# Patient Record
Sex: Male | Born: 1945
Health system: Southern US, Community
[De-identification: ages and names within clinical notes are randomized; demographics above are authoritative.]

## PROBLEM LIST (undated history)

## (undated) DIAGNOSIS — K635 Polyp of colon: Secondary | ICD-10-CM

## (undated) DIAGNOSIS — M1990 Unspecified osteoarthritis, unspecified site: Secondary | ICD-10-CM

## (undated) DIAGNOSIS — H269 Unspecified cataract: Secondary | ICD-10-CM

## (undated) DIAGNOSIS — K602 Anal fissure, unspecified: Secondary | ICD-10-CM

## (undated) DIAGNOSIS — K603 Anal fistula, unspecified: Secondary | ICD-10-CM

## (undated) DIAGNOSIS — Z5189 Encounter for other specified aftercare: Secondary | ICD-10-CM

## (undated) DIAGNOSIS — I4891 Unspecified atrial fibrillation: Secondary | ICD-10-CM

## (undated) DIAGNOSIS — I509 Heart failure, unspecified: Secondary | ICD-10-CM

## (undated) DIAGNOSIS — M1120 Other chondrocalcinosis, unspecified site: Secondary | ICD-10-CM

## (undated) DIAGNOSIS — I10 Essential (primary) hypertension: Secondary | ICD-10-CM

## (undated) DIAGNOSIS — K922 Gastrointestinal hemorrhage, unspecified: Secondary | ICD-10-CM

## (undated) DIAGNOSIS — K219 Gastro-esophageal reflux disease without esophagitis: Secondary | ICD-10-CM

## (undated) DIAGNOSIS — I482 Chronic atrial fibrillation, unspecified: Secondary | ICD-10-CM

## (undated) DIAGNOSIS — G4730 Sleep apnea, unspecified: Secondary | ICD-10-CM

## (undated) DIAGNOSIS — Z950 Presence of cardiac pacemaker: Secondary | ICD-10-CM

## (undated) DIAGNOSIS — M19011 Primary osteoarthritis, right shoulder: Secondary | ICD-10-CM

## (undated) DIAGNOSIS — E785 Hyperlipidemia, unspecified: Secondary | ICD-10-CM

## (undated) HISTORY — PX: PACEMAKER INSERTION: SHX728

## (undated) HISTORY — DX: Anal fissure, unspecified: K60.2

## (undated) HISTORY — PX: WISDOM TOOTH EXTRACTION: SHX21

## (undated) HISTORY — DX: Anal fistula, unspecified: K60.30

## (undated) HISTORY — DX: Heart failure, unspecified: I50.9

## (undated) HISTORY — DX: Polyp of colon: K63.5

## (undated) HISTORY — DX: Presence of cardiac pacemaker: Z95.0

## (undated) HISTORY — DX: Chronic atrial fibrillation, unspecified: I48.20

## (undated) HISTORY — PX: SHOULDER SURGERY: SHX246

## (undated) HISTORY — DX: Hyperlipidemia, unspecified: E78.5

## (undated) HISTORY — DX: Sleep apnea, unspecified: G47.30

## (undated) HISTORY — DX: Other chondrocalcinosis, unspecified site: M11.20

## (undated) HISTORY — DX: Essential (primary) hypertension: I10

## (undated) HISTORY — DX: Anal fistula: K60.3

## (undated) HISTORY — PX: TREATMENT FISTULA ANAL: SUR1390

## (undated) HISTORY — DX: Primary osteoarthritis, right shoulder: M19.011

## (undated) HISTORY — DX: Encounter for other specified aftercare: Z51.89

## (undated) HISTORY — DX: Gastrointestinal hemorrhage, unspecified: K92.2

## (undated) HISTORY — DX: Unspecified osteoarthritis, unspecified site: M19.90

## (undated) HISTORY — PX: TONSILLECTOMY: SUR1361

## (undated) HISTORY — DX: Unspecified cataract: H26.9

## (undated) HISTORY — DX: Unspecified atrial fibrillation: I48.91

---

## 1998-07-18 ENCOUNTER — Inpatient Hospital Stay (HOSPITAL_COMMUNITY): Admission: EM | Admit: 1998-07-18 | Discharge: 1998-07-23 | Payer: Self-pay | Admitting: Internal Medicine

## 1998-09-05 ENCOUNTER — Ambulatory Visit (HOSPITAL_COMMUNITY): Admission: RE | Admit: 1998-09-05 | Discharge: 1998-09-05 | Payer: Self-pay | Admitting: Cardiovascular Disease

## 1998-11-29 ENCOUNTER — Inpatient Hospital Stay (HOSPITAL_COMMUNITY): Admission: AD | Admit: 1998-11-29 | Discharge: 1998-12-01 | Payer: Self-pay | Admitting: Neurology

## 1998-11-30 ENCOUNTER — Encounter: Payer: Self-pay | Admitting: Internal Medicine

## 2003-08-24 ENCOUNTER — Encounter: Payer: Self-pay | Admitting: Internal Medicine

## 2003-09-19 ENCOUNTER — Encounter: Payer: Self-pay | Admitting: Internal Medicine

## 2004-01-09 ENCOUNTER — Encounter: Admission: RE | Admit: 2004-01-09 | Discharge: 2004-01-09 | Payer: Self-pay | Admitting: Internal Medicine

## 2004-04-12 ENCOUNTER — Encounter: Payer: Self-pay | Admitting: Internal Medicine

## 2004-04-12 ENCOUNTER — Inpatient Hospital Stay (HOSPITAL_COMMUNITY): Admission: AD | Admit: 2004-04-12 | Discharge: 2004-04-17 | Payer: Self-pay | Admitting: Internal Medicine

## 2004-04-29 ENCOUNTER — Ambulatory Visit (HOSPITAL_COMMUNITY): Admission: RE | Admit: 2004-04-29 | Discharge: 2004-04-29 | Payer: Self-pay | Admitting: Internal Medicine

## 2004-04-29 ENCOUNTER — Encounter: Payer: Self-pay | Admitting: Internal Medicine

## 2004-06-21 ENCOUNTER — Encounter (INDEPENDENT_AMBULATORY_CARE_PROVIDER_SITE_OTHER): Payer: Self-pay | Admitting: *Deleted

## 2004-09-06 ENCOUNTER — Ambulatory Visit: Payer: Self-pay

## 2004-10-08 ENCOUNTER — Ambulatory Visit: Payer: Self-pay

## 2004-11-02 ENCOUNTER — Ambulatory Visit: Payer: Self-pay | Admitting: Internal Medicine

## 2004-11-20 ENCOUNTER — Ambulatory Visit: Payer: Self-pay | Admitting: Internal Medicine

## 2004-11-21 ENCOUNTER — Ambulatory Visit: Payer: Self-pay | Admitting: Internal Medicine

## 2004-12-17 ENCOUNTER — Ambulatory Visit: Payer: Self-pay | Admitting: Internal Medicine

## 2004-12-20 ENCOUNTER — Ambulatory Visit: Payer: Self-pay | Admitting: Internal Medicine

## 2005-01-21 ENCOUNTER — Ambulatory Visit: Payer: Self-pay | Admitting: Internal Medicine

## 2005-02-21 ENCOUNTER — Ambulatory Visit: Payer: Self-pay | Admitting: Internal Medicine

## 2005-03-28 ENCOUNTER — Ambulatory Visit: Payer: Self-pay | Admitting: Internal Medicine

## 2005-05-02 ENCOUNTER — Ambulatory Visit: Payer: Self-pay | Admitting: Internal Medicine

## 2005-06-06 ENCOUNTER — Ambulatory Visit: Payer: Self-pay | Admitting: Internal Medicine

## 2005-07-11 ENCOUNTER — Ambulatory Visit: Payer: Self-pay | Admitting: Internal Medicine

## 2005-08-07 ENCOUNTER — Ambulatory Visit: Payer: Self-pay | Admitting: Internal Medicine

## 2005-09-12 ENCOUNTER — Ambulatory Visit: Payer: Self-pay | Admitting: Internal Medicine

## 2005-10-17 ENCOUNTER — Ambulatory Visit: Payer: Self-pay | Admitting: Internal Medicine

## 2005-12-12 ENCOUNTER — Ambulatory Visit: Payer: Self-pay | Admitting: Internal Medicine

## 2006-01-12 ENCOUNTER — Ambulatory Visit: Payer: Self-pay | Admitting: Internal Medicine

## 2006-02-18 ENCOUNTER — Ambulatory Visit: Payer: Self-pay | Admitting: Internal Medicine

## 2006-03-25 ENCOUNTER — Ambulatory Visit: Payer: Self-pay | Admitting: Internal Medicine

## 2006-04-24 ENCOUNTER — Ambulatory Visit: Payer: Self-pay | Admitting: Internal Medicine

## 2006-06-05 ENCOUNTER — Ambulatory Visit: Payer: Self-pay | Admitting: Internal Medicine

## 2006-07-09 ENCOUNTER — Ambulatory Visit: Payer: Self-pay | Admitting: Internal Medicine

## 2006-08-14 ENCOUNTER — Ambulatory Visit: Payer: Self-pay | Admitting: Internal Medicine

## 2006-09-11 ENCOUNTER — Ambulatory Visit: Payer: Self-pay | Admitting: Internal Medicine

## 2006-11-05 ENCOUNTER — Ambulatory Visit: Payer: Self-pay | Admitting: Internal Medicine

## 2006-11-30 ENCOUNTER — Ambulatory Visit: Payer: Self-pay | Admitting: Internal Medicine

## 2006-12-31 ENCOUNTER — Ambulatory Visit: Payer: Self-pay | Admitting: Internal Medicine

## 2007-01-28 ENCOUNTER — Ambulatory Visit: Payer: Self-pay | Admitting: Internal Medicine

## 2007-02-25 ENCOUNTER — Ambulatory Visit: Payer: Self-pay | Admitting: Internal Medicine

## 2007-03-25 ENCOUNTER — Ambulatory Visit: Payer: Self-pay | Admitting: Internal Medicine

## 2007-04-21 ENCOUNTER — Ambulatory Visit: Payer: Self-pay | Admitting: Internal Medicine

## 2007-05-19 ENCOUNTER — Ambulatory Visit: Payer: Self-pay | Admitting: Internal Medicine

## 2007-06-01 ENCOUNTER — Ambulatory Visit: Payer: Self-pay | Admitting: Internal Medicine

## 2007-07-15 ENCOUNTER — Ambulatory Visit: Payer: Self-pay | Admitting: Internal Medicine

## 2007-08-12 ENCOUNTER — Ambulatory Visit: Payer: Self-pay | Admitting: Internal Medicine

## 2007-09-10 ENCOUNTER — Ambulatory Visit: Payer: Self-pay | Admitting: Internal Medicine

## 2007-10-08 ENCOUNTER — Ambulatory Visit: Payer: Self-pay | Admitting: Internal Medicine

## 2007-11-04 ENCOUNTER — Ambulatory Visit: Payer: Self-pay | Admitting: Internal Medicine

## 2008-02-03 ENCOUNTER — Ambulatory Visit: Payer: Self-pay | Admitting: Internal Medicine

## 2008-05-04 ENCOUNTER — Ambulatory Visit: Payer: Self-pay | Admitting: Internal Medicine

## 2008-06-08 ENCOUNTER — Ambulatory Visit: Payer: Self-pay | Admitting: Internal Medicine

## 2008-08-03 ENCOUNTER — Ambulatory Visit: Payer: Self-pay | Admitting: Internal Medicine

## 2008-09-16 DIAGNOSIS — I4891 Unspecified atrial fibrillation: Secondary | ICD-10-CM | POA: Insufficient documentation

## 2008-09-16 DIAGNOSIS — I442 Atrioventricular block, complete: Secondary | ICD-10-CM | POA: Insufficient documentation

## 2008-09-16 DIAGNOSIS — Z950 Presence of cardiac pacemaker: Secondary | ICD-10-CM | POA: Insufficient documentation

## 2008-09-16 DIAGNOSIS — I10 Essential (primary) hypertension: Secondary | ICD-10-CM

## 2008-11-02 ENCOUNTER — Ambulatory Visit: Payer: Self-pay | Admitting: Internal Medicine

## 2009-01-16 ENCOUNTER — Encounter: Payer: Self-pay | Admitting: Internal Medicine

## 2009-01-16 ENCOUNTER — Ambulatory Visit: Payer: Self-pay | Admitting: Internal Medicine

## 2009-01-16 LAB — CONVERTED CEMR LAB
BUN: 23 mg/dL (ref 6–23)
Basophils Relative: 0.2 % (ref 0.0–3.0)
Chloride: 104 meq/L (ref 96–112)
Eosinophils Relative: 1.6 % (ref 0.0–5.0)
Glucose, Bld: 104 mg/dL — ABNORMAL HIGH (ref 70–99)
HCT: 43.2 % (ref 39.0–52.0)
Hemoglobin: 14.8 g/dL (ref 13.0–17.0)
Lymphs Abs: 1.2 10*3/uL (ref 0.7–4.0)
MCV: 91.4 fL (ref 78.0–100.0)
Monocytes Absolute: 0.8 10*3/uL (ref 0.1–1.0)
Potassium: 3.7 meq/L (ref 3.5–5.1)
RBC: 4.73 M/uL (ref 4.22–5.81)
WBC: 7.3 10*3/uL (ref 4.5–10.5)

## 2009-01-23 ENCOUNTER — Telehealth: Payer: Self-pay | Admitting: Internal Medicine

## 2009-01-24 ENCOUNTER — Ambulatory Visit: Payer: Self-pay | Admitting: Internal Medicine

## 2009-01-24 ENCOUNTER — Inpatient Hospital Stay (HOSPITAL_COMMUNITY): Admission: RE | Admit: 2009-01-24 | Discharge: 2009-01-25 | Payer: Self-pay | Admitting: Internal Medicine

## 2009-01-25 ENCOUNTER — Encounter: Payer: Self-pay | Admitting: Internal Medicine

## 2009-02-07 ENCOUNTER — Encounter: Payer: Self-pay | Admitting: Internal Medicine

## 2009-02-07 ENCOUNTER — Ambulatory Visit: Payer: Self-pay

## 2009-02-23 ENCOUNTER — Telehealth: Payer: Self-pay | Admitting: Internal Medicine

## 2009-02-26 ENCOUNTER — Telehealth: Payer: Self-pay | Admitting: Internal Medicine

## 2009-03-21 ENCOUNTER — Telehealth (INDEPENDENT_AMBULATORY_CARE_PROVIDER_SITE_OTHER): Payer: Self-pay | Admitting: *Deleted

## 2009-04-17 ENCOUNTER — Ambulatory Visit: Payer: Self-pay | Admitting: Internal Medicine

## 2009-06-11 ENCOUNTER — Ambulatory Visit: Payer: Self-pay | Admitting: Cardiology

## 2009-06-28 ENCOUNTER — Ambulatory Visit: Payer: Self-pay

## 2009-06-28 ENCOUNTER — Encounter: Payer: Self-pay | Admitting: Cardiology

## 2009-07-23 ENCOUNTER — Encounter: Payer: Self-pay | Admitting: Internal Medicine

## 2009-07-23 ENCOUNTER — Ambulatory Visit: Payer: Self-pay | Admitting: Internal Medicine

## 2009-08-01 ENCOUNTER — Encounter: Payer: Self-pay | Admitting: Internal Medicine

## 2009-09-18 ENCOUNTER — Telehealth: Payer: Self-pay | Admitting: Internal Medicine

## 2009-09-20 ENCOUNTER — Telehealth: Payer: Self-pay | Admitting: Internal Medicine

## 2009-10-18 ENCOUNTER — Encounter (INDEPENDENT_AMBULATORY_CARE_PROVIDER_SITE_OTHER): Payer: Self-pay | Admitting: *Deleted

## 2009-10-21 ENCOUNTER — Encounter: Payer: Self-pay | Admitting: Internal Medicine

## 2009-10-22 ENCOUNTER — Ambulatory Visit: Payer: Self-pay | Admitting: Internal Medicine

## 2009-10-25 ENCOUNTER — Telehealth (INDEPENDENT_AMBULATORY_CARE_PROVIDER_SITE_OTHER): Payer: Self-pay | Admitting: *Deleted

## 2009-10-25 ENCOUNTER — Encounter: Payer: Self-pay | Admitting: Internal Medicine

## 2009-11-20 ENCOUNTER — Telehealth: Payer: Self-pay | Admitting: Internal Medicine

## 2009-11-30 ENCOUNTER — Encounter (INDEPENDENT_AMBULATORY_CARE_PROVIDER_SITE_OTHER): Payer: Self-pay | Admitting: *Deleted

## 2009-12-20 ENCOUNTER — Encounter (INDEPENDENT_AMBULATORY_CARE_PROVIDER_SITE_OTHER): Payer: Self-pay | Admitting: *Deleted

## 2010-01-20 ENCOUNTER — Encounter: Payer: Self-pay | Admitting: Internal Medicine

## 2010-01-21 ENCOUNTER — Ambulatory Visit: Payer: Self-pay | Admitting: Internal Medicine

## 2010-01-30 ENCOUNTER — Encounter: Payer: Self-pay | Admitting: Internal Medicine

## 2010-02-25 ENCOUNTER — Ambulatory Visit: Payer: Self-pay | Admitting: Internal Medicine

## 2010-02-25 DIAGNOSIS — Z860100 Personal history of colon polyps, unspecified: Secondary | ICD-10-CM | POA: Insufficient documentation

## 2010-03-01 ENCOUNTER — Encounter: Payer: Self-pay | Admitting: Internal Medicine

## 2010-03-14 ENCOUNTER — Ambulatory Visit: Payer: Self-pay | Admitting: Internal Medicine

## 2010-03-18 ENCOUNTER — Encounter: Payer: Self-pay | Admitting: Internal Medicine

## 2010-04-16 ENCOUNTER — Ambulatory Visit: Payer: Self-pay | Admitting: Internal Medicine

## 2010-04-18 ENCOUNTER — Encounter: Admission: RE | Admit: 2010-04-18 | Discharge: 2010-04-18 | Payer: Self-pay | Admitting: Orthopedic Surgery

## 2010-04-22 ENCOUNTER — Telehealth: Payer: Self-pay | Admitting: Internal Medicine

## 2010-07-19 ENCOUNTER — Encounter: Payer: Self-pay | Admitting: Internal Medicine

## 2010-08-05 ENCOUNTER — Ambulatory Visit: Payer: Self-pay | Admitting: Cardiology

## 2010-08-05 ENCOUNTER — Encounter: Payer: Self-pay | Admitting: Cardiology

## 2010-11-08 ENCOUNTER — Ambulatory Visit
Admission: RE | Admit: 2010-11-08 | Discharge: 2010-11-08 | Payer: Self-pay | Source: Home / Self Care | Attending: Internal Medicine | Admitting: Internal Medicine

## 2010-11-09 ENCOUNTER — Encounter: Payer: Self-pay | Admitting: Internal Medicine

## 2010-11-14 NOTE — Procedures (Signed)
Summary: Colonoscopy   Colonoscopy  Procedure date:  09/19/2003  Findings:      Location:  [facility].  Results: Polyp.    Colonoscopy  Procedure date:  09/19/2003  Findings:      Location:  [facility].  Results: Polyp.   Patient Name: Oracio, Galen. MRN:  Procedure Procedures: Colonoscopy CPT: [phone].    with polypectomy. CPT: A3573898.  Personnel: Endoscopist: Wilhemina Bonito. Marina Goodell, MD.  Referred By: Pearletha Furl Jacky Kindle, MD.  Exam Location: Exam performed in Outpatient Clinic. Outpatient  Patient Consent: Procedure, Alternatives, Risks and Benefits discussed, consent obtained, from patient. Consent was obtained by the RN.  Indications  Average Risk Screening Routine.  History  Current Medications: Patient is currently taking Coumadin.  Comments: Coumadin held 4 days prior to exam Pre-Exam Physical: Performed Sep 19, 2003. Entire physical exam was normal.  Exam Exam: Extent of exam reached: Cecum, extent intended: Cecum.  The cecum was identified by appendiceal orifice and IC valve. Patient position: on left side. Colon retroflexion performed. Images taken. ASA Classification: III. Tolerance: excellent.  Monitoring: Pulse and BP monitoring, Oximetry used. Supplemental O2 given.  Colon Prep Used Golytely for colon prep. Prep results: excellent.  Sedation Meds: Patient assessed and found to be appropriate for moderate (conscious) sedation. Fentanyl 75 mcg. given IV. Versed 7 mg. given IV.  Findings NORMAL EXAM: Cecum to Rectum. Comments: Melanosis coli present.  POLYP: Cecum, Maximum size: 20 mm. sessile polyp. Procedure:  snare with cautery, The polyp was removed piece meal. removed, retrieved, Polyp sent to pathology. ICD9: Colon Polyps: 211.3. Comments: Edge and base of lesion fulgarized. Lesion located on proximal lip of ICV.   Assessment Abnormal examination, see findings above.  Diagnoses: 211.3: Colon Polyps.    Events  Unplanned Interventions: No intervention was required.  Unplanned Events: There were no complications. Plans Comments: RESUME COUMADIN IN 1 WEEK Disposition: After procedure patient sent to recovery. After recovery patient sent home.  Scheduling/Referral: Colonoscopy, to Wilhemina Bonito. Marina Goodell, MD, IN 1 YEAR IF NO CANCER AND POLYP ADENOMATOUS.,    This report was created from the original endoscopy report, which was reviewed and signed by the above listed endoscopist.   cc:  Geoffry Paradise, MD      Kristeen Miss, MD      The Patient

## 2010-11-14 NOTE — Letter (Signed)
Summary: [facility]  [facility]   Imported By: Lester Joy 03/16/2010 08:12:02  _____________________________________________________________________  External Attachment:    Type:   Image     Comment:   External Document

## 2010-11-14 NOTE — Procedures (Signed)
Summary: EGD   EGD  Procedure date:  04/29/2004  Findings:      Location: [facility]  Findings: Gastritis   Patient Name: [name] Page, [name] Page. MRN:  Procedure Procedures: Panendoscopy (EGD) CPT: 43235.    with biopsy(s)/brushing(s). CPT: [id number].  Personnel: Endoscopist: Wilhemina Bonito. Marina Goodell, MD.  Exam Location: Exam performed in Endoscopy Suite.  Patient Consent: Procedure, Alternatives, Risks and Benefits discussed, consent obtained,  Indications  Surveillance of: Duodenal ulcer.  History  Current Medications: Patient is not currently taking Coumadin.  Comments: Currently off coumadin after acute GI bleed 04-12-04 Pre-Exam Physical: Performed Apr 29, 2004  Entire physical exam was normal.  Exam Exam Info: Maximum depth of insertion Duodenum, intended Duodenum. Patient position: on left side. Vocal cords visualized. Gastric retroflexion performed. Images taken. ASA Classification: III. Tolerance: excellent.  Sedation Meds: Demerol 70 mg. given IV. Versed 7 mg. given IV.  Monitoring: BP and pulse monitoring done. Oximetry used. Supplemental O2 given  Fluoroscopy: Fluoroscopy was not used.  Findings HIATAL HERNIA:  MUCOSAL ABNORMALITY: Antrum. Erosions present. RUT done, results pending. ICD9: Gastritis, Acute: 535.00. Comment: mild changes only.  HEALED ULCER: in Duodenal Bulb Comments: previous ulcer healed. clips absent.   Assessment Abnormal examination, see findings above.  Diagnoses: 535.00: Gastritis, Acute.   Events  Unplanned Intervention: No unplanned interventions were required.  Unplanned Events: There were no complications. Plans Comments: 1.Daily PPI indefinitely 2.OK to start coumadin if you wish 3. limit asa/nsaid exposure 4. F/U Clo and treat if + Disposition: After procedure patient sent to recovery. After recovery patient sent home.  Scheduling: Follow-up prn.  Comments: Return to the care of Dr. Jacky Kindle  This report was  created from the original endoscopy report, which was reviewed and signed by the above listed endoscopist.   cc:  Geoffry Paradise, MD      Kristeen Miss, MD      The Patient

## 2010-11-14 NOTE — Cardiovascular Report (Signed)
Summary: Office Visit Remote   Office Visit Remote   Imported By: Roderic Ovens 10/25/2009 11:23:19  _____________________________________________________________________  External Attachment:    Type:   Image     Comment:   External Document

## 2010-11-14 NOTE — Cardiovascular Report (Signed)
Summary: Office Visit Remote  Office Visit Remote   Imported By: Roderic Ovens 01/30/2010 14:17:36  _____________________________________________________________________  External Attachment:    Type:   Image     Comment:   External Document

## 2010-11-14 NOTE — Letter (Signed)
Summary: Remote Device Check  [facility], Main Office  [address]   [place], [place] [address]   Phone: [phone]  Fax: [phone]     October 25, 2009 MRN: [id number]   [name] Page [address] [place], [place]  [address]   Dear Mr. [name],   Your remote transmission was recieved and reviewed by your physician.  All diagnostics were within normal limits for you.  __X___Your next transmission is scheduled for:     January 21, 2010.  Please transmit at any time this day.  If you have a wireless device your transmission will be sent automatically.      Sincerely,  Proofreader

## 2010-11-14 NOTE — Letter (Signed)
Summary: Anticoagulation Modification Letter  [facility]  [address] [place], [place] [address]   Phone: [phone]  Fax: [phone]            Feb 25, 2010  Re:    [name] Page DOB:    [birth year] MRN:    [id number]    Dear [name] Page:  We have scheduled the above patient for an Colonoscopy procedure. Our records show that  he is on anticoagulation therapy. Please advise as to how long the patient may come off their therapy of Coumadin prior to the scheduled procedure(s) on 03/14/10.   Please fax back/or route the completed form to [facility], CMA at [phone].  Thank you for your help with this matter.  Sincerely,    Wilhemina Bonito. Marina Goodell, MD   Physician Recommendation:    Hold Coumadin 5 days prior ____________  Other ______________________________     Appended Document: Anticoagulation Modification Letter called [name] office regarding Coumadin Letter. I will refax letter to [facility] and she will send it back to me after [name] reviews.

## 2010-11-14 NOTE — Procedures (Signed)
Summary: EGD   EGD  Procedure date:  04/12/2004  Findings:      Location: [facility]  Findings: Ulcer with hemorrhage  Patient Name: [name] Page, [name] Page. MRN:  Procedure Procedures: Panendoscopy (EGD) CPT: 43235.    with electrocoagulation and/ or injection for bleeding. CPT: 43255.  Personnel: Endoscopist: Dora L. Juanda Chance, MD.  Exam Location: Exam performed in Endoscopy Suite.  Patient Consent: Procedure, Alternatives, Risks and Benefits discussed, consent obtained,  Indications  Evaluation of: Anemia,   Symptoms: Melena. Last bleeding episode <6 hours ago, documentation: hospital staff observation.  History  Current Medications: Patient is taking a non-steroidal medication. Patient is currently taking Coumadin.  Pre-Exam Physical: Performed Apr 12, 2004  Entire physical exam was normal. Cardio- pulmonary exam, HEENT exam, Abdominal exam, Extremity exam, Neurological exam, Mental status exam WNL.  Exam Exam Info: Maximum depth of insertion Duodenum, intended Duodenum. Vocal cords visualized. Gastric retroflexion performed. Images taken. ASA Classification: II. Tolerance: excellent.  Sedation Meds: Fentanyl 100 mcg. given IV. Versed 10 mg. given IV.  Monitoring: BP and pulse monitoring done. Oximetry used. Supplemental O2 given  Fluoroscopy: Fluoroscopy was not used.  Findings ULCER: in Duodenal Bulb at 6 o'clock. Minimum Size: 6 mm. Maximum size: 10 mm. deep clean based ulcer with a blood clot and active ooze. ICD9: Ulcer, Duodenal, Acute with Hemorrhage: 534.00.  - Injection: Duodenal Bulb. Injected with Epinephrine 1:10000, 2 ccs. Outcome: successful. Comments: 2 Endoclips  placed over the bleeding vessel.  - BLOOD CLOT: fresh blood clot with active oozing, found in Duodenal Bulb.   Assessment Abnormal examination, see findings above.  Diagnoses: 534.00: Ulcer, Duodenal, Acute with Hemorrhage.   Comments: s/p hemostasis with Epi and  Clips Events  Unplanned Intervention: No unplanned interventions were required.  Unplanned Events: There were no complications. Comments: pt became diaphoretic during the procedure but maintained his VS's Plans Medication(s): see chart:   Comments: see chart, -plans discussed with Dr Waynard Edwards, Disposition: After procedure patient sent to recovery.   This report was created from the original endoscopy report, which was reviewed and signed by the above listed endoscopist.   cc:  Geoffry Paradise, MD      Kristeen Miss, MD

## 2010-11-14 NOTE — Letter (Signed)
Summary: Remote Device Check  [facility], Main Office  [address]   [place], [place] [address]   Phone: [phone]  Fax: [phone]     January 30, 2010 MRN: [id number]   DION SIBAL [address] [place], [place]  [address]   Dear Mr. KEIDEL,   Your remote transmission was recieved and reviewed by your physician.  All diagnostics were within normal limits for you.    ___X___Your next office visit is scheduled for:  JULY 2011 WITH DR Graciela Husbands. Please call our office to schedule an appointment.    Sincerely,  Proofreader

## 2010-11-14 NOTE — Letter (Signed)
Summary: [facility] Instructions  [facility]  [address] [place], [place] [address]   Phone: [phone]  Fax: [phone]       [name] Page    Jun 19, 1946    MRN: [id number]        Procedure Day /Date:THURSDAY, 03/14/10     Arrival Time:12:30 PM     Procedure Time:1:30 PM     Location of Procedure:                    X  [facility] (4th Floor)                        PREPARATION FOR COLONOSCOPY WITH MOVIPREP   Starting 5 days prior to your procedure 5/28/11do not eat nuts, seeds, popcorn, corn, beans, peas,  salads, or any raw vegetables.  Do not take any fiber supplements (e.g. Metamucil, Citrucel, and Benefiber).  THE DAY BEFORE YOUR PROCEDURE         DATE: 03/13/10  MWU:[id number]  1.  Drink clear liquids the entire day-NO SOLID FOOD  2.  Do not drink anything colored red or purple.  Avoid juices with pulp.  No orange juice.  3.  Drink at least 64 oz. (8 glasses) of fluid/clear liquids during the day to prevent dehydration and help the prep work efficiently.  CLEAR LIQUIDS INCLUDE: Water Jello Ice Popsicles Tea (sugar ok, no milk/cream) Powdered fruit flavored drinks Coffee (sugar ok, no milk/cream) Gatorade Juice: apple, white grape, white cranberry  Lemonade Clear bullion, consomm, broth Carbonated beverages (any kind) Strained chicken noodle soup Hard Candy                             4.  In the morning, mix first dose of MoviPrep solution:    Empty 1 Pouch A and 1 Pouch B into the disposable container    Add lukewarm drinking water to the top line of the container. Mix to dissolve    Refrigerate (mixed solution should be used within 24 hrs)  5.  Begin drinking the prep at 5:00 p.m. The MoviPrep container is divided by 4 marks.   Every 15 minutes drink the solution down to the next mark (approximately 8 oz) until the full liter is complete.   6.  Follow completed prep with 16 oz of clear liquid of your choice (Nothing red or  purple).  Continue to drink clear liquids until bedtime.  7.  Before going to bed, mix second dose of MoviPrep solution:    Empty 1 Pouch A and 1 Pouch B into the disposable container    Add lukewarm drinking water to the top line of the container. Mix to dissolve    Refrigerate  THE DAY OF YOUR PROCEDURE      DATE: 6/2/11DAY: THURSDAY  Beginning at 8:320 a.m. (5 hours before procedure):         1. Every 15 minutes, drink the solution down to the next mark (approx 8 oz) until the full liter is complete.  2. Follow completed prep with 16 oz. of clear liquid of your choice.    3. You may drink clear liquids until 11:30 AM(2 HOURS BEFORE PROCEDURE).   MEDICATION INSTRUCTIONS  Unless otherwise instructed, you should take regular prescription medications with a small sip of water   as early as possible the morning of your procedure.      Stop  taking Coumadin on  _ _  (5 days before procedure).  Additional medication instructions: _         OTHER INSTRUCTIONS  You will need a responsible adult at least 63 years of age to accompany you and drive you home.   This person must remain in the waiting room during your procedure.  Wear loose fitting clothing that is easily removed.  Leave jewelry and other valuables at home.  However, you may wish to bring a book to read or  an iPod/MP3 player to listen to music as you wait for your procedure to start.  Remove all body piercing jewelry and leave at home.  Total time from sign-in until discharge is approximately 2-3 hours.  You should go home directly after your procedure and rest.  You can resume normal activities the  day after your procedure.  The day of your procedure you should not:   Drive   Make legal decisions   Operate machinery   Drink alcohol   Return to work  You will receive specific instructions about eating, activities and medications before you leave.    The above instructions have been reviewed  and explained to me by   _______________________    I fully understand and can verbalize these instructions _____________________________ Date _________

## 2010-11-14 NOTE — Procedures (Signed)
Summary: Colonoscopy  Patient: [name] Note: All result statuses are Final unless otherwise noted.  Tests: (1) Colonoscopy (COL)   COL Colonoscopy           DONE     [facility]     [address]     [place], [place]  [address]           COLONOSCOPY PROCEDURE REPORT           PATIENT:  [name] Page, [name] Page  MR#:  [id number]     BIRTHDATE:  [birth year], 64 yrs. old  GENDER:  male     ENDOSCOPIST:  Wilhemina Bonito. Eda Keys, MD     REF. BY:  Surveillance Program Recall,     PROCEDURE DATE:  03/14/2010     PROCEDURE:  Colonoscopy with snare polypectomy x 9     ASA CLASS:  Class III     INDICATIONS:  history of polyps ; large hyperplastic 2004,2006     MEDICATIONS:   Fentanyl 75 mcg IV, Versed 9 mg IV           DESCRIPTION OF PROCEDURE:   After the risks benefits and     alternatives of the procedure were thoroughly explained, informed     consent was obtained.  Digital rectal exam was performed and     revealed no abnormalities.   The LB [id number] endoscope     was introduced through the anus and advanced to the cecum, which     was identified by both the appendix and ileocecal valve, without     limitations.Time to cecum = 2:07 min. The quality of the prep was     excellent, using MoviPrep.  The instrument was then slowly     withdrawn (time = 20:10 min) as the colon was fully examined.     <<PROCEDUREIMAGES>>           FINDINGS:  A 10mm sessile polyp was found in the ascending colon.     Polyp was snared without cautery. Retrieval was successful.     There were multiple polyps (9), measuring between 2mm and 5mm,     located in the transverse (1) and sigmoid (7) colon, identified     and removed. in the sigmoid colon. Polyps were snared without     cautery. Retrieval was successful.  Melanosis coli was found     throughout the colon.   Retroflexed views in the rectum revealed     no abnormalities.    The scope was then withdrawn from the patient     and the procedure  completed.           COMPLICATIONS:  None     ENDOSCOPIC IMPRESSION:     1) Sessile polyp in the ascending colon - removed     2) Polyps, multiple in the sigmoid and transverse colon -     removed     3) Melanosis throughout the colon           RECOMMENDATIONS:     1) Follow up colonoscopy in 3 or  5 years (pending path)           ______________________________     Wilhemina Bonito. Eda Keys, MD           CC:  Geoffry Paradise, MD;The Patient           n.     Rosalie DoctorWilhemina Bonito. Eda Keys at 03/14/2010 02:48 PM  Eir[place], Schuel[place], [phone]  Note: An exclamation mark (!) indicates a result that was not dispersed into the flowsheet. Document Creation Date: 03/14/2010 2:49 PM _______________________________________________________________________  (1) Order result status: Final Collection or observation date-time: 03/14/2010 14:38 Requested date-time:  Receipt date-time:  Reported date-time:  Referring Physician:   Ordering Physician: Fransico Setters [phone]) Specimen Source:  Source: Launa Grill Order Number: [id number] Lab site:   Appended Document: Colonoscopy recall 3 yrs/03-2013     Procedures Next Due Date:    Colonoscopy: 03/2013

## 2010-11-14 NOTE — Assessment & Plan Note (Signed)
Summary: DISCUSS COLON ON BLD THNRS   History of Present Illness Visit Type: Initial Consult Primary Provider: Gus Rankin, MD Chief Complaint: Colonoscopy, patient on blood thinners, last colon 5 years ago History of Present Illness:   65 year oldwhite male with a history of hypertension, hyperlipidemia, upper GI bleed secondary to duodenal ulcer, and chronic atrial fibrillation status post AV node ablation and dual-chamber pacemaker placement on chronic Coumadin therapy. He presents today regarding surveillance colonoscopy. His multiple chronic medical problems were stable. He has a history of adenomatous colon polyps. Index colonoscopy in 2004 revealing large right-sided hyperplastic lesion. Subsequent followup in 2006 revealing hyperplastic polyps only. Follow up in 5 years recommended. The patient denies any active GI complaints. He remains on Coumadin. He takes Prilosec for prophylaxis against ulcer recurrence. Review of outside records from 2 weeks ago reveals normal CBC with hemoglobin 15.2. Also, normal comprehensive metabolic panel.Marland Kitchen   GI Review of Systems      Denies abdominal pain, acid reflux, belching, bloating, chest pain, dysphagia with liquids, dysphagia with solids, heartburn, loss of appetite, nausea, vomiting, vomiting blood, weight loss, and  weight gain.      Reports hemorrhoids.     Denies anal fissure, black tarry stools, change in bowel habit, constipation, diarrhea, diverticulosis, fecal incontinence, heme positive stool, irritable bowel syndrome, jaundice, light color stool, liver problems, rectal bleeding, and  rectal pain. Preventive Screening-Counseling & Management      Drug Use:  no.      Current Medications (verified): 1)  Warfarin .... As Directed: 5 Mg Every Day Except Monday & Thursdayf; 7.5 Monday & Thursday 2)  Furosemide 40 Mg Tabs (Furosemide) .... Prn 3)  Prilosec 20 Mg Cpdr (Omeprazole) .Marland Kitchen.. 1 Tab Two Times A Day 4)  Potassium Chloride Crys Cr  20 Meq Cr-Tabs (Potassium Chloride Crys Cr) .... As Needed 5)  Coreg 25 Mg Tabs (Carvedilol) .... Two Times A Day 6)  Ramipril 10 Mg Caps (Ramipril) .... Two Times A Day 7)  Hydrochlorothiazide 25 Mg Tabs (Hydrochlorothiazide) .... Once Daily 8)  Crestor 20 Mg Tabs (Rosuvastatin Calcium) .Marland Kitchen.. 1 Daily 9)  Tramadol Hcl 50 Mg Tabs (Tramadol Hcl) .Marland Kitchen.. 1 Three Times A Day As Needed For Pain  Allergies (verified): No Known Drug Allergies  Past History:  Past Medical History: Reviewed history from 02/19/2010 and no changes required. 1.  Atrial fibrillation: Chronic, s/p AV nodal ablation and on coumadin.  Patient developed dyspnea/fatigue with RV pacing and an LV lead was placed.  2.  St. Jude CRT-P device.  3.  Cardiomyopathy: Nonischemic, possibly tachycardia-mediated.  1997 LHC with no significant CAD.  Myoview (7/09) showed EF 44% with possible mild inferior ischemia.   Echo (8/09): EF 45-50%, moderate posterior hypokinesis, mild decreased RV systolic function.  4.  Upper GI bleed with duodenal ulcer in 2005.  Patient was on ASA and coumadin at that time.  ASA was stopped.   5.  Pseudogout 6.  Right shoulder OA 7.  Hyperlipidemia 8.  HTN Hyperplastic Colon Polyp Hx. of Anal Fistula  Past Surgical History: Reviewed history from 02/19/2010 and no changes required. pacemaker insertion St. Jude's Anal Fistula  Family History: Noncontributory Family History of Heart Disease: Mother No FH of Colon Cancer:  Social History: Administrator, Civil Service, does a lot of traveling.  No smoking and very rare ETOH.  Lives in [place].  Exercises daily.  Daily Caffeine Use Illicit Drug Use - no Drug Use:  no  Review of Systems  The patient complains of arthritis/joint pain.  The patient denies allergy/sinus, anemia, anxiety-new, back pain, blood in urine, breast changes/lumps, change in vision, confusion, cough, coughing up blood, depression-new, fainting, fatigue, fever, headaches-new, hearing  problems, heart murmur, heart rhythm changes, itching, menstrual pain, muscle pains/cramps, night sweats, nosebleeds, pregnancy symptoms, shortness of breath, skin rash, sleeping problems, sore throat, swelling of feet/legs, swollen lymph glands, thirst - excessive , urination - excessive , urination changes/pain, urine leakage, vision changes, and voice change.    Vital Signs:  Patient profile:   65 year old male Height:      73 inches Weight:      238.38 pounds BMI:     31.56 Pulse rate:   92 / minute Pulse rhythm:   regular BP sitting:   120 / 76  (left arm) Cuff size:   regular  Vitals Entered By: June McMurray CMA Duncan Dull) (Feb 25, 2010 9:00 AM)  Physical Exam  General:  Well developed, well nourished, no acute distress. Head:  Normocephalic and atraumatic. Eyes:  PERRLA, no icterus. Nose:  No deformity, discharge,  or lesions. Mouth:  No deformity or lesions, Neck:  Supple; no masses or thyromegaly. Lungs:  Clear throughout to auscultation. Heart:  Regular rate and rhythm; no murmurs, rubs,  or bruits. Abdomen:  Soft, nontender and nondistended. No masses, hepatosplenomegaly or hernias noted. Normal bowel sounds. Rectal:  deferred Prostate:  deferred Msk:  Symmetrical with no gross deformities. Normal posture. Pulses:  Normal pulses noted. Extremities:  chronic stasis changes Neurologic:  Alert and  oriented x4;   Skin:  no jaundice Psych:  Alert and cooperative. Normal mood and affect.   Impression & Recommendations:  Problem # 1:  PERSONAL HISTORY OF COLONIC POLYPS (ICD-V12.72) personal history of large inflammatory polyp of the right colon and 2004. Follow up in 2006 revealing hyperplastic polyps only. Now due for followup. He is at higher than baseline risk due to his multiple comorbidities and the need for chronic anticoagulation therapy. However, he is clinically stable and appropriate candidate without contraindication.  Plan: #1. Colonoscopy. The nature of the  procedure as well as the risks, benefits, and alternatives were again reviewed. He understood and agreed to proceed. #2. Movi prep prescribed. The patient instructed on its use #3. Hold Coumadin 5 days prior to colonoscopy if approved by Dr. Jacky Kindle. Let her sent to Dr. Jacky Kindle for his review and approval. Excellent   Problem # 2:  ATRIAL FIBRILLATION (ICD-427.31) currently in normal sinus rhythm status post ablation and dual-chamber pacemaker placement. Chronic Coumadin therapy (286.9) as discussed. Impact on the procedure as discussed above.  Other Orders: Colonoscopy (Colon)  Patient Instructions: 1)  Colonoscopy [facility] 03/14/10 1:30 pm arrive at 12:30 pm 2)  Movi prep instructions given. 3)  Movi prep Rx. sent to your pharmacy for you to pick up. 4)  Colonoscopy and Flexible Sigmoidoscopy brochure given.  5)  We will contact you regarding holding your Coumadin 5 days prior. 6)  Letter sent to Dr. Jacky Kindle for approval.   If our office has not contacted you regarding this 1 week prior to your procedure, please call to confirm.   7)  The medication list was reviewed and reconciled.  All changed / newly prescribed medications were explained.  A complete medication list was provided to the patient / caregiver. 8)  printed and given to pt. Milford Cage NCMA  Feb 25, 2010 9:43 AM 9)  Copy sent to : Geoffry Paradise, MD Prescriptions: MOVIPREP 100 GM  SOLR (PEG-KCL-NACL-NASULF-NA ASC-C) As per prep instructions.  #1 x 0   Entered by:   Milford Cage NCMA   Authorized by:   Hilarie Fredrickson MD   Signed by:   Milford Cage NCMA on 02/25/2010   Method used:   Electronically to        [id number] (retail)       [address]       [place], [place]  [address]       Ph: [phone] or [phone]       Fax: [phone]   RxID:   [id number]

## 2010-11-14 NOTE — Progress Notes (Signed)
  Phone Note Outgoing Call   Call placed by: Duncan Dull, RN, BSN,  October 25, 2009 12:07 PM Call placed to: Insurer Summary of Call: S/W Florentina Addison at [facility] and was informed the pt would owe their deductible and co-insurance to see Dr. Shirlee Latch until the re-evaluation process in Jan. Pt aware and will call me back if they have not gotten an update on the [facility] star system before his next appt in Feb. Contact number for [facility] billing is [phone].    Initial call taken by: Duncan Dull, RN, BSN,  October 25, 2009 12:10 PM

## 2010-11-14 NOTE — Progress Notes (Signed)
Summary: fatigue  Phone Note Call from Patient Call back at [phone]   Caller: Patient Reason for Call: Talk to Nurse Summary of Call: pt needs to talk to pacer tech about the adjustments that were made when he was in here. pt feels alot more fatigue and less energy. Initial call taken by: Edman Circle,  April 22, 2010 8:35 AM  Follow-up for Phone Call        I spoke with the pt and he was wondering if the adjustment made to his device on 04/16/10 would effect his energy level.  Per the 04/16/10 OV note the only change that was made was RV capture was turned on.  I spoke with Delsa Grana and she said this would not effect the pt's energy level. I made the pt aware and asked him to call our office if he had any further problems. Pt agreed with plan.  Follow-up by: Julieta Gutting, RN, BSN,  April 22, 2010 8:57 AM

## 2010-11-14 NOTE — Letter (Signed)
Summary: Appointment - Reminder 2  [facility], Main Office  [address]   [place], [place] [address]   Phone: [phone]  Fax: [phone]     October 18, 2009 MRN: [id number]   [name] Page [address] [place], [place]  [address]   Dear Mr. GAINS,  Our records indicate that it is time to schedule a follow-up appointment with Dr. Shirlee Latch. It is very important that we reach you to schedule this appointment. We look forward to participating in your health care needs. Please contact us at the number listed above at your earliest convenience to schedule your appointment.  If you are unable to make an appointment at this time, give [place] a call so we can update our records.  Sincerely,   [facility] Scheduling Team

## 2010-11-14 NOTE — Progress Notes (Signed)
Summary: [facility]   Imported By: Sherian Rein 02/26/2010 10:49:13  _____________________________________________________________________  External Attachment:    Type:   Image     Comment:   External Document

## 2010-11-14 NOTE — Discharge Summary (Signed)
Summary: Discharge Summary   NAME:  [name] Page, [name] Page                          ACCOUNT NO.:  [id number]   MEDICAL RECORD NO.:  [id number]                   PATIENT TYPE:  INP   LOCATION:  4734                                 FACILITY:  [facility]   PHYSICIAN:  Mark A. Perini, M.D.                DATE OF BIRTH:  [birth year]   DATE OF ADMISSION:  04/12/2004  DATE OF DISCHARGE:  04/17/2004                                 DISCHARGE SUMMARY   DISCHARGE DIAGNOSES:  1.  Severe acute upper gastrointestinal bleeding due to a bleeding duodenal      ulcer, resolved at the time of discharge.  2.  Chronic atrial fibrillation, rate controlled.  3.  Pacemaker functioning apparently well.  4.  One 8-beat run of ventricular tachycardia on the first day of admission      with normal electrolytes.  Outpatient cardiology follow-up to be done at      a later time.  5.  Anemia, improved at the time of discharge.  6.  History of idiopathic dilated cardiomyopathy.  7.  Hyperlipidemia.  8.  History of arthritis.  9.  Past history of colon polyp from 2004.  10. Left ventricular hypertrophy with normal systolic function by an      echocardiogram in 2004.  11. Past history of AV nodal ablation.  12. Borderline diastolic hypertension.  13. Left atrial enlargement.  14. History of pseudogout.   CONSULTS:  GI   PROCEDURE:  Urgent upper esophagogastroduodenoscopy performed on April 12, 2004 showing a duodenal ulcer with acute hemorrhage status post hemostasis  with epinephrine and clips.   DISCHARGE MEDICATIONS:  1.  He is to continue Lanoxin 0.25 mg once daily.  2.  Altace 5 mg once daily.  3.  No aspirin and no vitamin D and no Coumadin until further notice.  4.  Zocor 20 mg q.h.s.  5.  He is to resume furosemide 40 mg daily and 20 mEq of potassium as needed      as before.  6.  Multivitamin and vitamin C are to be resumed.  Other vitamins may be      resumed as well.  7.  Toprol XL 50 mg one-half tablet  daily.  8.  Protonix 40 mg one tablet b.i.d.  9.  Iron pill daily.  10. Tylenol as needed.  11. He is to avoid all NSAIDs.   HISTORY OF PRESENT ILLNESS:  [name] is a pleasant 65 year old gentleman  who presented to the office on April 12, 2004 with several episodes of  melanotic stool.  He had had at least four episodes prior to coming to the  office.  His INR was therapeutic at 2.8 at this time.  He did report some  dizziness.  He had no history of NSAID use or past history of peptic ulcer  disease.  In the office he was found to  have markedly heme-positive dark  stool in the rectal vault and hemoglobin was found to have dropped  approximately 3.5-4 g from his baseline.  He is therefore admitted to the  hospital for further management.   HOSPITAL COURSE:  Mr. Shock was admitted to a telemetry bed.  GI was  consulted and urgently took him to the endoscopy suite where he was found to  have an actively bleeding acute duodenal ulcer.  This was treated with  hemostasis obtained.  Mr. Garabedian did lose quite a bit of blood and required  a transfusion of 4 units of fresh frozen plasma, 5 or 6 units of packed red  blood cells as well as vitamin K given over several doses.  He remained  relatively hemodynamically stable during the entire admission.  He did have  one episode of 8 beats of ventricular tachycardia noted on his first day of  admission with no recurrence of this.  This was asymptomatic.  By April 17, 2004 he was deemed stable for discharge home with continued outpatient  follow-up.   PHYSICAL EXAMINATION:  VITAL SIGNS:  Temperature 98.6, pulse 64, respiratory  rate 20, blood pressure 117/69, 97% on room air.  GENERAL:  He was alert, in no acute distress.  HEART:  Regular.  ABDOMEN:  Soft and nontender with normoactive bowel sounds.  No distention.  No masses.  EXTREMITIES:  There was no peripheral edema.  LUNGS:  Clear.   DISCHARGE LABORATORIES:  Hemoglobin 9.2.  INR 1.3.   BMET was within normal.   White count 7.8, platelet count 202,000, hematocrit 26.6.  PT 15.2 seconds.  Liver function tests on admission were normal.  TSH was 1.202.  Digoxin 0.5  on admission.  H. pylori antibody was negative on the serum sample.   DISCHARGE INSTRUCTIONS:  Mr. Farnan is to be up as tolerated.  He is to  follow a low salt, low fat, low cholesterol diet as before.  He is to call  with any recurrent problems.  He is to keep a follow-up visit with Dr.  Elease Hashimoto  on April 23, 2004.  Furthermore, he is to follow up with Dr. Jacky Kindle in two  weeks and to follow up with Dr. Marina Goodell from [facility] in 7-10 days after  discharge and he is also to follow up with Dr. Graciela Husbands in three to four weeks  after discharge.                                                Mark A. Waynard Edwards, M.D.    MAP/MEDQ  D:  06/21/2004  T:  06/22/2004  Job:  [id number]   cc:   Wilhemina Bonito. Marina Goodell, M.D. [facility]   Geoffry Paradise, M.D.  [address]  [place]  [place] [address]  Fax: [phone]   Vesta Mixer, M.D.  [address]  [place]  [place] [address]  Fax: [phone]   Duke Salvia, M.D.

## 2010-11-14 NOTE — Assessment & Plan Note (Signed)
Summary: 1 year rov   Referring Provider:  Dr. Graciela Husbands Primary Provider:  Gus Rankin, MD  CC:  1 year rov.  Pt has no cardiac concerns.  .  History of Present Illness: [name] Page is seen in followup for permanent atrial fibrillation s/p AV nodal ablation tachycardia-induced cardiomyopathy with subsequent near normalization of LV function. He then developed some worsening as well as symptoms of modest congestive heart failure and underwent CRT up grade with significant improvement.  he is doing quite well now except for problems with his knee     Current Medications (verified): 1)  Warfarin .... As Directed: 5 Mg Every Day Except Monday & Thursdayf; 7.5 Monday & Thursday 2)  Furosemide 40 Mg Tabs (Furosemide) .... Prn 3)  Prilosec 20 Mg Cpdr (Omeprazole) .Marland Kitchen.. 1 Tab Two Times A Day 4)  Potassium Chloride Crys Cr 20 Meq Cr-Tabs (Potassium Chloride Crys Cr) .... As Needed 5)  Coreg 25 Mg Tabs (Carvedilol) .... Two Times A Day 6)  Ramipril 10 Mg Caps (Ramipril) .... Two Times A Day 7)  Hydrochlorothiazide 25 Mg Tabs (Hydrochlorothiazide) .... Once Daily 8)  Crestor 10 Mg Tabs (Rosuvastatin Calcium) .... Once Daily 9)  Tramadol Hcl 50 Mg Tabs (Tramadol Hcl) .Marland Kitchen.. 1 Three Times A Day As Needed For Pain  Allergies (verified): No Known Drug Allergies  Past History:  Past Medical History: Last updated: 02/19/2010 1.  Atrial fibrillation: Chronic, s/p AV nodal ablation and on coumadin.  Patient developed dyspnea/fatigue with RV pacing and an LV lead was placed.  2.  St. Jude CRT-P device.  3.  Cardiomyopathy: Nonischemic, possibly tachycardia-mediated.  1997 LHC with no significant CAD.  Myoview (7/09) showed EF 44% with possible mild inferior ischemia.   Echo (8/09): EF 45-50%, moderate posterior hypokinesis, mild decreased RV systolic function.  4.  Upper GI bleed with duodenal ulcer in 2005.  Patient was on ASA and coumadin at that time.  ASA was stopped.   5.  Pseudogout 6.  Right  shoulder OA 7.  Hyperlipidemia 8.  HTN Hyperplastic Colon Polyp Hx. of Anal Fistula  Past Surgical History: Last updated: 02/19/2010 pacemaker insertion St. 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  Lives in [place].  Exercises daily.  Daily Caffeine Use Illicit Drug Use - no  Vital Signs:  Patient profile:   65 year old male Height:      73 inches Weight:      235 pounds BMI:     31.12 Pulse rate:   84 / minute Pulse rhythm:   regular BP sitting:   126 / 72  (left arm) Cuff size:   regular  Vitals Entered By: Judithe Modest CMA (April 16, 2010 1:51 PM)  Physical Exam  General:  The patient was alert and oriented in no acute distress. HEENT Normal.  Neck veins were flat, carotids were brisk.  Lungs were clear.  Heart sounds were regular without murmurs or gallops.  Abdomen was soft with active bowel sounds. There is no clubbing cyanosis or edema. Skin Warm and dry    PPM Specifications Following MD:  Lewayne Bunting, MD     PPM Vendor:  St Jude     PPM Model Number:  [id number]     PPM Serial Number:  [id number] PPM DOI:  01/24/2009  PPM Implanting MD:  Sherryl Manges, MD  Lead 1    Location: RV     DOI: 11/29/1998     Model #: [id number]     Serial #: [id number]     Status: active Lead 2    Location: LV     DOI: 01/24/2009     Model #: [id number]     Serial #: [id number]     Status: active  Magnet Response Rate:  BOL 100 ERI  85  Indications:  A-FIB with ablatiion: CM Pacemaker dependent  Explantation Comments:  01/24/2009 Affinity 5130/65921 explanted.  PPM Follow Up Remote Check?  No Battery Voltage:  2.96 V     Battery Est. Longevity:  6.2 YEARS     Pacer Dependent:  Yes     Right Ventricle  Amplitude: PACED AT 30 mV, Impedance: 410 ohms, Threshold: 0.875  V at 0.8 msec Left Ventricle  Impedance: 490 ohms, Threshold: 0.625 V at 0.5 msec Configuration: UNIPOLAR  Episodes Coumadin:  Yes Ventricular High Rate:  0     Ventricular Pacing:  >99%  Parameters Mode:  VVIR     Lower Rate Limit:  70     Upper Rate Limit:  150 Next Remote Date:  07/18/2010     Next Cardiology Appt Due:  04/14/2011 Tech Comments:  Normal device function.  RV cap confirm turned on today.  Checked by [employer].  Will start Merlin transmissions.  ROV 12 months SK. Gypsy Balsam RN BSN  April 16, 2010 2:06 PM   Impression & Recommendations:  Problem # 1:  ATRIAL FIBRILLATION (ICD-427.31) Permanent status post AV junction ablation His updated medication list for this problem includes:    Coreg 25 Mg Tabs (Carvedilol) .Marland Kitchen..Marland Kitchen Two times a day  Problem # 2:  CARDIOMYOPATHY, SECONDARY,TACHYCARDIA (ICD-425.4) stable on his current medications His updated medication list for this problem includes:    Furosemide 40 Mg Tabs (Furosemide) .Marland Kitchen... Prn    Coreg 25 Mg Tabs (Carvedilol) .Marland Kitchen..Marland Kitchen Two times a day    Ramipril 10 Mg Caps (Ramipril) .Marland Kitchen..Marland Kitchen Two times a day    Hydrochlorothiazide 25 Mg Tabs (Hydrochlorothiazide) ..... Once daily  Problem # 3:  CARDIAC PACEMAKER CRT- STJ (ICD-V45.01) Device parameters and data were reviewed and no changes were made  Problem # 4:  AV BLOCK, COMPLETE (ICD-426.0) stable  His updated medication list for this problem includes:    Coreg 25 Mg Tabs (Carvedilol) .Marland Kitchen..Marland Kitchen Two times a day    Ramipril 10 Mg Caps (Ramipril) .Marland Kitchen..Marland Kitchen Two times a day  Patient Instructions: 1)  Your physician wants you to follow-up in: 3 MONTHS WITH DR [facility] AND 12 MONTHS WITH DR Ladona Ridgel.  You will receive a reminder letter in the mail two months in advance. If you don't receive a letter, please call our office to schedule the follow-up appointment. 2)  You are scheduled for a device check from home on  July 18, 2010. You may send your transmission at any time that day. If you  have a wireless device, the transmission will be sent automatically. After your physician reviews your transmission, you will receive a postcard with your next transmission date. 3)  Your physician recommends that you continue on your current medications as directed. Please refer to the Current Medication list given to you today.

## 2010-11-14 NOTE — Progress Notes (Signed)
Summary: [employer] will not pay for office visit with Dr. Shirlee Latch  Phone Note Call from Patient Call back at Home Phone [phone]   Caller: Spouse Reason for Call: Talk to Nurse, Talk to Doctor Summary of Call: pt spouse called to give you information regarding Dr. Shirlee Latch, the starr rating and [employer]. since Dr. Shirlee Latch is not a 2 star MD they will not pay for visit and she wants to discuss this further with you. Initial call taken by: Omer Jack,  November 20, 2009 10:04 AM  Follow-up for Phone Call        Per Fabio Neighbors, they are still working on this issue and they hope to know more by the end of next week. She will inform pt of outcome. Pt's wife aware and is very thankful of what we are doing to help them. She rescheduled Mr. Schar appt until the end of March in hopes this will be resolved by then. Pt is doing great and this is a f/u routine visit.  Follow-up by: Duncan Dull, RN, BSN,  November 21, 2009 2:18 PM

## 2010-11-14 NOTE — Letter (Signed)
Summary: Colonoscopy Letter  [facility]  [address] [place], [place] [address]   Phone: [phone]  Fax: [phone]      November 30, 2009 MRN: [id number]   [name] Page [address] [place], [place]  [address]   Dear Mr. PETER,   According to your medical record, it is time for you to schedule a Colonoscopy. The American Cancer Society recommends this procedure as a method to detect early colon cancer. Patients with a family history of colon cancer, or a personal history of colon polyps or inflammatory bowel disease are at increased risk.  This letter has beeen generated based on the recommendations made at the time of your procedure. If you feel that in your particular situation this may no longer apply, please contact our office.  Please call our office at [phone] to schedule this appointment or to update your records at your earliest convenience.  Thank you for cooperating with [place] to provide you with the very best care possible.   Sincerely,  Wilhemina Bonito. Marina Goodell, M.D.  [facility] [phone]

## 2010-11-14 NOTE — Letter (Signed)
Summary: New Patient letter  [name]  [address] [place], [place] [address]   Phone: [phone]  Fax: [phone]       12/20/2009 MRN: [id number]  [name] Page [address] [place], [place]  [address]  Dear [name] Page,  Welcome to the Gastroenterology Division at [facility].    You are scheduled to see Dr. Marina Goodell on 01-24-10 at 3:30p.m. on the 3rd floor at [facility], [address]  We ask that you try to arrive at our office 15 minutes prior to your appointment time to allow for check-in.  We would like you to complete the enclosed self-administered evaluation form prior to your visit and bring it with you on the day of your appointment.  We will review it with you.  Also, please bring a complete list of all your medications or, if you prefer, bring the medication bottles and we will list them.  Please bring your insurance card so that we may make a copy of it.  If your insurance requires a referral to see a specialist, please bring your referral form from your primary care physician.  Co-payments are due at the time of your visit and may be paid by cash, check or credit card.     Your office visit will consist of a consult with your physician (includes a physical exam), any laboratory testing he/she may order, scheduling of any necessary diagnostic testing (e.g. x-ray, ultrasound, CT-scan), and scheduling of a procedure (e.g. Endoscopy, Colonoscopy) if required.  Please allow enough time on your schedule to allow for any/all of these possibilities.    If you cannot keep your appointment, please call [phone] to cancel or reschedule prior to your appointment date.  This allows [place] the opportunity to schedule an appointment for another patient in need of care.  If you do not cancel or reschedule by 5 p.m. the business day prior to your appointment date, you will be charged a $50.00 late cancellation/no-show fee.    Thank you for choosing [facility] for your medical needs.  We appreciate the opportunity to care for you.  Please visit [place] at our website  to learn more about our practice.                     Sincerely,                                                             The Gastroenterology Division

## 2010-11-14 NOTE — Procedures (Signed)
Summary: Colonoscopy   Colonoscopy  Procedure date:  12/17/2004  Findings:      Location:  [facility].  Pathology:  Hyperplastic polyp.     Ileitis  Procedures Next Due Date:    Colonoscopy: 12/2009 Patient Name: [name] Page, [name] Page. MRN:  Procedure Procedures: Colonoscopy CPT: [phone].    with biopsy. CPT: [id number].    with polypectomy. CPT: [id number].  Personnel: Endoscopist: Wilhemina Bonito. Marina Goodell, MD.  Exam Location: Exam performed in Outpatient Clinic. Outpatient  Patient Consent: Procedure, Alternatives, Risks and Benefits discussed, consent obtained, from patient. Consent was obtained by the RN.  Indications  Surveillance of: Adenomatous Polyp(s). This is an initial surveillance exam. Initial polypectomy was performed in 2004. in Dec. 1-2 Polyps were found at Index Exam. Largest polyp removed was > 19 mm. Prior polyp located in proximal (splenic flexure and beyond) colon. Pathology of worst  polyp: hyperplastic.  History  Current Medications: Patient is not currently taking Coumadin.  Pre-Exam Physical: Performed Dec 17, 2004. Entire physical exam was normal.  Exam Exam: Extent of exam reached: Terminal Ileum, extent intended: Terminal Ileum.  The cecum was identified by appendiceal orifice and IC valve. Patient position: on left side. Colon retroflexion performed. Images taken. ASA Classification: III. Tolerance: excellent.  Monitoring: Pulse and BP monitoring, Oximetry used. Supplemental O2 given.  Colon Prep Used Miralax for colon prep. Prep results: excellent.  Sedation Meds: Patient assessed and found to be appropriate for moderate (conscious) sedation. Fentanyl 75 mcg. given IV. Versed 8 mg. given IV.  Findings MELANOSIS: Cecum to Rectum. Comments: hypertrophic anal papilla.  NORMAL EXAM: Cecum. Comments: no polyp at prior polypectomy site.  - OTHER FINDING: punctate ileal ulcers found in Ileum. Biopsy/Other Finding taken. Comments: ? NSAIDS, ? IBD, ?  OTHER.  MULTIPLE POLYPS: Rectum. minimum size 2 mm, maximum size 3 mm. Procedure:  biopsy without cautery, removed, Polyp retrieved, 3 polyps Polyps sent to pathology. ICD9: Colon Polyps: 211.3. Comments: hyperplastic appearring.   Assessment Abnormal examination, see findings above.  Diagnoses: 211.3: Colon Polyps.   Comments: NONSPECIFIC ILEITIS MELANOSIS COLI Events  Unplanned Interventions: No intervention was required.  Unplanned Events: There were no complications. Plans Disposition: After procedure patient sent to recovery. After recovery patient sent home.  Scheduling/Referral: Colonoscopy, to Wilhemina Bonito. Marina Goodell, MD, IN 5 YEARS IF RECTAL POLYPS NONADENOMATOUS,    This report was created from the original endoscopy report, which was reviewed and signed by the above listed endoscopist.   cc:  Geoffry Paradise, MD      The Patient

## 2010-11-14 NOTE — Letter (Signed)
Summary: Patient Notice- Polyp Results  [facility]  [address] [place], [place] [address]   Phone: [phone]  Fax: [phone]        March 18, 2010 MRN: [id number]    [name] Page [address] [place], [place]  [address]    Dear Mr. IRBY,  I am pleased to inform you that the colon polyp(s) removed during your recent colonoscopy was (were) found to be benign (no cancer detected) upon pathologic examination.  I recommend you have a repeat colonoscopy examination in 3 years to look for recurrent polyps, as having colon polyps increases your risk for having recurrent polyps or even colon cancer in the future.  Should you develop new or worsening symptoms of abdominal pain, bowel habit changes or bleeding from the rectum or bowels, please schedule an evaluation with either your primary care physician or with me.  Additional information/recommendations:  __ No further action with gastroenterology is needed at this time. Please      follow-up with your primary care physician for your other healthcare      needs.   Please call us if you are having persistent problems or have questions about your condition that have not been fully answered at this time.  Sincerely,  Hilarie Fredrickson MD  This letter has been electronically signed by your physician.  Appended Document: Patient Notice- Polyp Results letter mailed.

## 2010-11-14 NOTE — Assessment & Plan Note (Signed)
Summary: TO ESTABLISH/NEED [facility] BECAUSE OF INS   Visit Type:  Follow-up Primary Provider:  Gus Rankin, MD  CC:  Atrial Fibrillation.  History of Present Illness: The patient presents for followup of his permanent atrial fibrillation and cardiomyopathy. He has been followed in this clinic but needs to switch because of insurance. He said excellent treatment with management of his arrhythmia requiring AV ablation and CRT. With all of this he feels great. He exercises every morning on an elliptical. He denies any shortness of breath, PND or orthopnea. He has had no palpitations, presyncope or syncope. He denies any chest pressure. He has had no weight gain or edema. He had fatigue earlier this summer but this resolved.  Current Medications (verified): 1)  Warfarin .... As Directed: 5 Mg Every Day Except Monday & Thursdayf; 7.5 Monday & Thursday 2)  Furosemide 40 Mg Tabs (Furosemide) .... Prn 3)  Prilosec 20 Mg Cpdr (Omeprazole) .Marland Kitchen.. 1 Tab Two Times A Day 4)  Potassium Chloride Crys Cr 20 Meq Cr-Tabs (Potassium Chloride Crys Cr) .... As Needed 5)  Coreg 25 Mg Tabs (Carvedilol) .... Two Times A Day 6)  Ramipril 10 Mg Caps (Ramipril) .... Two Times A Day 7)  Hydrochlorothiazide 25 Mg Tabs (Hydrochlorothiazide) .... Once Daily 8)  Crestor 10 Mg Tabs (Rosuvastatin Calcium) .... Once Daily 9)  Tramadol Hcl 50 Mg Tabs (Tramadol Hcl) .Marland Kitchen.. 1 Three Times A Day As Needed For Pain  Allergies (verified): No Known Drug Allergies  Past History:  Past Medical History: 1.  Atrial fibrillation: Chronic, s/p AV nodal ablation and on coumadin.  Patient developed dyspnea/fatigue with RV pacing and an LV lead was placed.  2.  St. Jude CRT-P device.  3.  Cardiomyopathy: Nonischemic, possibly tachycardia-mediated.  1997 LHC with no significant CAD.  Myoview (7/09) showed EF 44% with possible mild inferior ischemia.   Echo (9/10): EF 60% 4.  Upper GI bleed with duodenal ulcer in 2005.  Patient was on ASA and  coumadin at that time.  ASA was stopped.   5.  Pseudogout 6.  Right shoulder OA 7.  Hyperlipidemia 8.  HTN 9.  Hyperplastic Colon Polyp 10.Anal Fistula  Past Surgical History: Pacemaker insertion St. Jude's Anal Fistula Right shoulder surgery  Review of Systems       As stated in the HPI and negative for all other systems.   Vital Signs:  Patient profile:   65 year old male Height:      73 inches Weight:      237 pounds BMI:     31.38 Pulse rate:   70 / minute Resp:     18 per minute BP sitting:   138 / 72  (right arm)  Vitals Entered By: Marrion Coy, CNA (August 05, 2010 9:13 AM)  Physical Exam  General:  Well developed, well nourished, in no acute distress. Head:  normocephalic and atraumatic Eyes:  PERRLA/EOM intact; conjunctiva and lids normal. Neck:  Neck supple, no JVD. No masses, thyromegaly or abnormal cervical nodes. Chest Wall:  Well-heeled i ICD pocket Lungs:  Clear bilaterally to auscultation and percussion. Heart:  Non-displaced PMI, chest non-tender; regular rate and rhythm, S1, S2 without murmurs, rubs or gallops. Carotid upstroke normal, no bruit. Normal abdominal aortic size, no bruits. Femorals normal pulses, no bruits. Pedals normal pulses. No edema, no varicosities. Abdomen:  Bowel sounds positive; abdomen soft and non-tender without masses, organomegaly, or hernias noted. No hepatosplenomegaly. Msk:  Back normal, normal gait. Muscle strength and tone  normal. Extremities:  No clubbing or cyanosis. Neurologic:  Alert and oriented x 3. Skin:  Intact without lesions or rashes. Cervical Nodes:  no significant adenopathy Inguinal Nodes:  no significant adenopathy Psych:  Normal affect.   EKG  Procedure date:  08/05/2010  Findings:      Reviewed today and reported elsewhere  PPM Specifications Following MD:  Lewayne Bunting, MD     [facility] Vendor:  St Jude     PPM Model Number:  [id number]     PPM Serial Number:  [id number] PPM DOI:  01/24/2009     PPM  Implanting MD:  Sherryl Manges, MD  Lead 1    Location: RV     DOI: 11/29/1998     Model #: [id number]     Serial #: [id number]     Status: active Lead 2    Location: LV     DOI: 01/24/2009     Model #: [id number]     Serial #: [id number]     Status: active  Magnet Response Rate:  BOL 100 ERI  85  Indications:  A-FIB with ablatiion: CM Pacemaker dependent  Explantation Comments:  01/24/2009 Affinity 5130/65921 explanted.  PPM Follow Up Pacer Dependent:  Yes     Configuration: UNIPOLAR  Episodes Coumadin:  Yes  Parameters Mode:  VVIR     Lower Rate Limit:  70     Upper Rate Limit:  150  Impression & Recommendations:  Problem # 1:  ATRIAL FIBRILLATION (ICD-427.31) He tolerates Coumadin. He does not want to switch to Pradaxa.  He will continue meds as listed. Orders: EKG w/ Interpretation (93000)  Problem # 2:  HYPERTENSION, BENIGN (ICD-401.1) Well controlled.  He will continue meds as listed.  Problem # 3:  CARDIOMYOPATHY, SECONDARY,TACHYCARDIA (ICD-425.4) His last EF was normal.  He has no symptoms.  He is 100% paced with normal function reported above.  No change in therapy is indicated.  Patient Instructions: 1)  Your physician recommends that you schedule a follow-up appointment in: 1 yr with Dr Antoine Poche, 6 months with Dr Graciela Husbands 2)  Your physician recommends that you continue on your current medications as directed. Please refer to the Current Medication list given to you today.

## 2010-11-14 NOTE — Progress Notes (Signed)
Summary: Education officer, [facility]   Imported By: Sherian Rein 02/26/2010 10:50:53  _____________________________________________________________________  External Attachment:    Type:   Image     Comment:   External Document

## 2010-11-14 NOTE — Letter (Signed)
Summary: Device-Delinquent Phone Journalist, newspaper, Main Office  [address]   [place], [place] [address]   Phone: [phone]  Fax: [phone]     July 19, 2010 MRN: [id number]   [name] Page [address] [place], [place]  [address]   Dear Mr. UTTECH,  According to our records, you were scheduled for a device phone transmission on 07-18-2010.     We did not receive any results from this check.  If you transmitted on your scheduled day, please call us to help troubleshoot your system.  If you forgot to send your transmission, please send one upon receipt of this letter.  Thank you,   Architectural technologist Device Clinic

## 2010-12-02 ENCOUNTER — Encounter (INDEPENDENT_AMBULATORY_CARE_PROVIDER_SITE_OTHER): Payer: Self-pay | Admitting: *Deleted

## 2010-12-10 NOTE — Cardiovascular Report (Signed)
Summary: Office Visit Remote   Office Visit Remote   Imported By: Roderic Ovens 12/06/2010 11:16:25  _____________________________________________________________________  External Attachment:    Type:   Image     Comment:   External Document

## 2010-12-10 NOTE — Letter (Signed)
Summary: Remote Device Check  [employer], Main Office  [address]   [place], [place] [address]   Phone: [phone]  Fax: [phone]     December 02, 2010 MRN: [id number]   [name] Page [address] [place], [place]  [address]   Dear [name],   Your remote transmission was recieved and reviewed by your physician.  All diagnostics were within normal limits for you.  __X____Your next office visit is scheduled for:  July 2012 with Dr Graciela Husbands. Please call our office to schedule an appointment.    Sincerely,  Vella Kohler

## 2011-01-22 LAB — PROTIME-INR: Prothrombin Time: 29.9 seconds — ABNORMAL HIGH (ref 11.6–15.2)

## 2011-02-25 NOTE — Discharge Summary (Signed)
NAMEALMER, BUSHEY                ACCOUNT NO.:  [id number]   MEDICAL RECORD NO.:  [id number]          PATIENT TYPE:  INP   LOCATION:  2014                         FACILITY:  [facility]   PHYSICIAN:  Duke Salvia, MD, FACCDATE OF BIRTH:  [birth year]   DATE OF ADMISSION:  01/24/2009  DATE OF DISCHARGE:  01/25/2009                               DISCHARGE SUMMARY   This patient has no known drug allergies.   The time for this dictation is greater than 35 minutes.   FINAL DIAGNOSES:  1. Discharging day 1 status post pacemaker upgrade to a St. Jude      ANTHEM RF dual-chamber pacemaker with implantation of cardiac      resynchronization lead in the left ventricle, Dr. Sherryl Manges.  2. History of atrial fibrillation with difficult rate control.      a.     Status post AV node ablation/pacer dependence.      b.     Ejection fraction 1997, catheterization 25%, a tachycardia       mediated cardiomyopathy.  3. Left ventricular lead implanted based on the PAVE trial.   PROCEDURE:  On January 24, 2009, explantation of existing pacemaker with  implant of the St. Jude dual-chamber ANTHEM RF dual-chamber pacemaker,  also implantation of a left ventricular cardiac resynchronization lead,  Dr. Graciela Husbands.  The patient had no post-procedural complications.  He did  have a central venogram prior to lead placement with pocket revision.   BRIEF HISTORY:  Mr. Fyock is a 65 year old male.  He has a history of  dilated cardiomyopathy.  He presented with atrial fibrillation back in  1997 and congestive heart failure as a consequence of this.  An  echocardiogram showed ejection fraction of 15-20%, but no wall motion  abnormalities.  He underwent left heart catheterization at that time  with ejection fraction of 25%.  Coronary is angiographically normal.  Since that time he has been treated medically.  He also received a  pacemaker with AV node ablation.  His ejection fraction and cardiac  output have rebounded  such that his ejection fraction is now 45-55%.  His device is approaching elective replacement indicator and at the time  of the generator change, Dr. Graciela Husbands, responding to the PAVE studies,  thought that implantation of left ventricular lead would be appropriate.  The patient has been complaining of some decreased exercise tolerance  and perhaps the device with cardiac resynchronization will help with  this.   HOSPITAL COURSE:  The patient presents electively, January 24, 2009, after  being instructed in the risk and benefits of generator change and  cardiac resynchronization lead placement.  This procedure was done on  January 24, 2009, without complications.  The patient has had no pocket  hematoma.  Chest x-ray shows the leads are in appropriate position.  The  device has been interrogated after implantation and all values are  within normal limits.  The patient is asked to keep his incision dry  over the next 7 days and to sponge bathe until Wednesday January 31, 2009.   MEDICATIONS AT DISCHARGE:  1. Coumadin as he has been taking.  2. Crestor 10 mg daily at bedtime.  3. Furosemide 400 mg daily.  4. Altace 10 mg twice daily.  5. Prilosec 20 mg daily.  6. K-Dur 20 mEq daily.  7. Coreg 25 mg twice daily.  8. Hydrochlorothiazide 25 mg daily.   He follows up at the [facility], [address] in  [place].  1. The patient to come in on Wednesday February 07, 2009, at 09:40.  2. To see Dr. Graciela Husbands, Tuesday, Feb 27, 2009, at 09:30.   LABORATORY STUDIES:  Pertinent to this admission were drawn on January 17, 2009, white cells 7.3, hemoglobin 14.8, hematocrit 43.2, and platelets  are 238.  Protime 23, INR 2.2, sodium 141, potassium 3.7, chloride 104,  bicarbonate 31, glucose 104, BUN is 23, and creatinine 1.      Maple Mirza, [place]      Duke Salvia, MD, [facility]  Electronically Signed    GM/MEDQ  D:  01/25/2009  T:  01/26/2009  Job:  [id number]   cc:   Cristy Hilts. Jacinto Halim,  MD

## 2011-02-25 NOTE — Assessment & Plan Note (Signed)
[facility]                                 ON-CALL NOTE   [name], [name]                       MRN:          [id number]  DATE:01/13/2009                            DOB:          30-Dec-1945    PHONE NUMBER:  [phone]   ELECTROPHYSIOLOGIST:  Duke Salvia, MD, [facility]   PRIMARY CARDIOLOGIST:  Cristy Hilts. Jacinto Halim, MD   HISTORY:  [name] is a 65 year old male patient who follows with Dr.  Graciela Husbands for his pacemaker.  He has a history of cardiomyopathy and prior  history of congestive heart failure as well as uncontrolled atrial  fibrillation status post AV node ablation and pacemaker implantation.  He is apparently close to end of life on his pacemaker generator.  He  called the answering service tonight because he was concerned over some  symptoms he has been having recently.  He feels short of breath with  exertion.  He notes his heart rate was 58, and it is usually in the 61  range.  He was concerned that there was a problem with his pacemaker.  He usually exercises on his elliptical machine several times a week  without much problem.  He exercised on it several days ago for 45  minutes without any shortness of breath or chest discomfort.  Today, he  tried to go on the elliptical and had to stop after about 5 minutes.  He  denies any chest discomfort.  He denies any syncope, near-syncope,  orthopnea, PND, or pedal edema.  He denies any fevers, chills, cough,  nausea, vomiting, or diarrhea.  He does not feel his pulse is irregular.   PLAN:  On looking through the records, it appears that [name] is  pacemaker dependent.  I tried to reassure him that his pulse rate was  still within the range that his pacemaker usually paces him at.  He also  does not feel poorly when he is at rest.  I advised him that if he notes  that his heart rate drops lower or he starts to feel poorly at rest,  then he should present to the emergency room for further  evaluation.  I  also advised him to contact his primary cardiologist, Dr. Jacinto Halim, as he  may need further evaluation.  Of note, the patient tells me he has never  had coronary disease.  I also advised him that I would get him into the  office on Monday for a pacemaker check.   DISPOSITION:  As noted above, the patient should contact his primary  cardiologist, Dr. Jacinto Halim, to arrange evaluation.  I have left a message  at the office for him to be contacted to have an  evaluation with our EP nurses on Monday to further evaluate his  pacemaker.  If he begins to feel worse over the weekend, he should  present to the emergency room for further evaluation.      [name] Newcomer, PA-C  Electronically Signed      [name] Sans. Daleen Squibb, MD, [facility]  Electronically Signed   SW/MedQ  DD: 01/13/2009  DT: 01/14/2009  Job #: [id number]   cc:   Cristy Hilts. Jacinto Halim, MD

## 2011-02-25 NOTE — Op Note (Signed)
NAMELAW, CORSINO                ACCOUNT NO.:  [id number]   MEDICAL RECORD NO.:  [id number]          PATIENT TYPE:  INP   LOCATION:  2014                         FACILITY:  [facility]   PHYSICIAN:  Duke Salvia, MD, FACCDATE OF BIRTH:  1946/01/21   DATE OF PROCEDURE:  01/24/2009  DATE OF DISCHARGE:                               OPERATIVE REPORT   PREOPERATIVE DIAGNOSES:  Previously implanted pacemaker for  atrioventricular junction ablation, uncontrolled atrial fibrillation,  and a tachycardia-induced cardiomyopathy, largely resolved.   POSTOPERATIVE DIAGNOSES:  Previously implanted pacemaker for  atrioventricular junction ablation, uncontrolled atrial fibrillation,  and a tachycardia-induced cardiomyopathy, largely resolved.   PROCEDURES:  Contrast venogram x2, left ventricular lead placement,  pacemaker explantation, pocket revision, and pacemaker implantation.   Following obtaining informed consent, the patient was brought to the  electrophysiology laboratory placed on the fluoroscopic table in supine  position.  After routine prep and drape of the left upper chest,  lidocaine was infiltrated in the prepectoral subclavicular region.  Venogram having been done and demonstrated the course of the patency of  the extrathoracic left subclavian vein.  We took great care not to  invade the pacemaker pocket.  A small incision was made.  Access was  obtained without difficulty, but the guide wire would not pass past the  junction of the innominate vein and the superior vena cava.  We then  inserted a 6-French dilator, took another contrast venogram that  demonstrated in fact that there was a connection, although it was quite  narrowed.  We were able to pass a Wholey wire past this connection.  We  then took out the 6-French dilator, put a 9.5 French sheath and through  this passed and [id number] coronary sinus cannulation catheter, which allowed  for rapid access to the coronary sinus.  A  nonocclusive venogram of the  coronary sinus demonstrated a high lateral branch that was relatively  decent size.  We were able to pass in this field the [facility] wire and  then the whisper wire into this vein and because of its relatively small  size, we used a 4196 Medtronic lead, serial number [id number].  In the  distal ramifications of this vein, there was diaphragmatic stimulation.  We pulled it back, however, and then raised it forward until the tip was  at the junction of the mid and proximal thirds and in this location,  there was no diaphragmatic stimulation.  The bipolar threshold was about  1.5 volts at 0.5 milliseconds and it turned out the unipolar tip to can,  threshold was about 0.7 volts at 0.5 milliseconds.  In any case, after  deployment we removed the 9.5 French sheath and then proceeded to  undertake explantation of the previous pacemaker, this was done without  difficulty.  Because of the much larger size of the front tip CRT  pacemaker, we had to revise the pocket with a caudal extension.  This  having been done, hemostasis was obtained and FloSeal was used to assure  hemostasis.  The new lead delivery system was then removed, parameters  confirmed lead,  secured to the prepectoral fascia, and then the lead  attached to a St. Jude Anthem RF device, model S8535669, serial number  [id number].   We also checked the previously implanted RV lead model 138080 C serial  number [id number] and a threshold of 1.2 volts at 0.5 milliseconds with  impedance of 432 ohms.  These leads were then attached to the  aforementioned device, the pocket was copiously irrigated with  antibiotic containing saline solution prior the FloSeal insertion.  The  leads and the pulse generator were then placed in the pocket, secured to  the prepectoral fascia the atrial port of the  device having been plugged.  The wound was closed in 3 layers in normal  fashion.  The wound was washed, dried, and a benzoin  Steri-Strip  dressing was applied.  Needle counts, sponge counts, and instrument  counts were correct at the end of the procedure according to staff.  The  patient tolerated the procedure without apparent complications.      Duke Salvia, MD, [facility]  Electronically Signed     SCK/MEDQ  D:  01/24/2009  T:  01/25/2009  Job:  [id number]   cc:   Cristy Hilts. Jacinto Halim, MD

## 2011-02-25 NOTE — Letter (Signed)
June 01, 2007    Cristy Hilts. Jacinto Halim, MD  [address]  [place], [place] [address]   RE:  KAYCEE, MCGAUGH  MRN:  [id number]  /  DOB:  1946-04-01   Dear Vonna Kotyk:   Mr. Sciandra says he has now established his cardiology care with you and  he was very pleased at his encounter yesterday.  As you know, he has  atrial fibrillation and is status post AV ablation years ago for  tachycardia-induced cardiomyopathy and has done very, very well.  He is  status post pacemaker implantation and his battery continues to do well  on this.   MEDICATIONS:  Currently include:  1. Altace 10 b.i.d.  2. Carvedilol 25 b.i.d.  3. Hydrochlorothiazide.  4. Furosemide.  5. Coumadin.   EXAMINATION:  His blood pressure today is 109/66 with a pulse of 65.  LUNGS:  Clear.  HEART SOUNDS:  Regular.  EXTREMITIES:  Without edema.   IMPRESSION:  1. Atrial fibrillation - permanent.  2. Status post atrioventricular junction ablation for number 1.  3. Tachycardia-induced cardiomyopathy with resolution following number      2.  4. Status post pacer for the above.  5. Hypertension.   Mr. Kavin, Weckwerth, is doing really well.  We will see him again in 1  year's time.  He will continue with transtelephonic monitoring.    Sincerely,      Duke Salvia, MD, [facility]  Electronically Signed    SCK/MedQ  DD: 06/01/2007  DT: 06/01/2007  Job #: [id number]

## 2011-02-25 NOTE — Letter (Signed)
June 08, 2008    [name] Hilts. Jacinto Halim, MD  [address]  [place], [place] [address]   RE:  [name] Page, [name] Page  MRN:  [id number]  /  DOB:  Oct 03, 1946   Dear [name] Page,   It was a pleasure to see [name] Page at your request concerning his LV  function in the setting of his AV ablation for uncontrolled atrial  fibrillation and a severe cardiomyopathy that was undertaken close to 10  years ago now.  He has done beautifully since then.  Left ventricular  function has been described in the 45-55% range since recovery initially  in 2002 and these are consistent with the echo report read by your  colleague, [name] Page the other day with the persistent apical wall  motion abnormality that has been evident for forever.  There is a  question about moderate posterior wall hypokinesis which is new.  There  is no posterior abnormality described on the nuclear study.   His functional capacity remains very good.  He has no problems with  chest pain, shortness of breath or peripheral edema.   MEDICATIONS:  1. Altace 10 b.i.d.  2. Warfarin.  3. Furosemide as needed.  4. Potassium as needed.  5. Carvedilol 25 b.i.d.  6. Hydrochlorothiazide 25.  7. Crestor.   PHYSICAL EXAMINATION:  VITAL SIGNS:  His blood pressure is 108/74.  The  pulse is 67.  His weight was 227, which is unstable having loss about 25  pounds in the last 3 years.  NECK:  Neck veins were flat.  LUNGS:  Clear.  HEART:  Heart sounds were regular.  EXTREMITIES:  Without peripheral edema.  SKIN:  Warm and dry.  GENERAL:  He is in no acute distress.   Interrogation of his St. Jude pulse generator demonstrates that his  battery voltage is 2.65 with 6.8 kg impedance.  This translates to  approximately 1 year of ongoing longevity.  He was device-dependent, his  impedance was 436 and threshold was at 1 volt of 0.8.   IMPRESSION:  1. Complete heart block without escape rhythm, status post      anteroventral junction ablation.  2.  Atrial fibrillation with an uncontrolled ventricular response      secondary tachycardia-induced cardiomyopathy, largely resolved.   [name] Page, [name] Page has relatively stable left ventricular systolic function  in the 45-50% range over the last 7 years following his AV ablation for  his uncontrolled atrial fibrillation and a significant now resolved  tachycardia-induced cardiomyopathy.  He is device-dependent.  My  recommendation would be that we actually use whole study on the current  course.  He is going to need device generator replacement probably in  the next 6-12 months.  Based on the PAVE trial, I would suggest that we  consider thinking about LV lead upgrade at that time.  As well as his  doing is not clear that would be unnecessary, but may be worth  attempting at the time of device generator replacement.   I have advised him of the above.  I do not think that there is a strong  indication at this point to catheterization, but I will defer that to  expertise.   Thank you very much for asking me to see him.    Sincerely,      [name] Salvia, MD, [facility]  Electronically Signed    SCK/MedQ  DD: 06/08/2008  DT: 06/09/2008  Job #: [id number]   CC:    [name] Page  [name] Page, M.D.

## 2011-02-28 NOTE — Assessment & Plan Note (Signed)
[facility]                         ELECTROPHYSIOLOGY OFFICE NOTE   RAYLAND, HAMED                       MRN:          [id number]  DATE:11/30/2006                            DOB:          December 06, 1945    November 30, 2006   [name] was seen today in the clinic on November 30, 2006, for follow-  up of his St. Jude model #5130 Affinity.  Date of implant was November 29, 1998, for atrial fibrillation with an AV node ablation.  On  interrogation of his device today, his battery voltage is 2.75, R-waves  were not measured.  He is pacemaker dependent to a rate of 30 with a  ventricular capture threshold of 1 volt at 0.8 msec and a ventricular  lead impedence of 435 ohms.  No changes were made in his parameters.  He  will continue with his telephone checks on a monthly basis through  [facility] with a return office visit in one year's time.      Altha Harm, LPN  Electronically Signed      Duke Salvia, MD, [facility]  Electronically Signed   PO/MedQ  DD: 11/30/2006  DT: 11/30/2006  Job #: [id number]

## 2011-02-28 NOTE — Discharge Summary (Signed)
NAME:  [name] Page, [name] Page                          ACCOUNT NO.:  [id number]   MEDICAL RECORD NO.:  [id number]                   PATIENT TYPE:  INP   LOCATION:  4734                                 FACILITY:  [facility]   PHYSICIAN:  Mark A. Perini, M.D.                DATE OF BIRTH:  01/29/1946   DATE OF ADMISSION:  04/12/2004  DATE OF DISCHARGE:  04/17/2004                                 DISCHARGE SUMMARY   DISCHARGE DIAGNOSES:  1.  Severe acute upper gastrointestinal bleeding due to a bleeding duodenal      ulcer, resolved at the time of discharge.  2.  Chronic atrial fibrillation, rate controlled.  3.  Pacemaker functioning apparently well.  4.  One 8-beat run of ventricular tachycardia on the first day of admission      with normal electrolytes.  Outpatient cardiology follow-up to be done at      a later time.  5.  Anemia, improved at the time of discharge.  6.  History of idiopathic dilated cardiomyopathy.  7.  Hyperlipidemia.  8.  History of arthritis.  9.  Past history of colon polyp from 2004.  10. Left ventricular hypertrophy with normal systolic function by an      echocardiogram in 2004.  11. Past history of AV nodal ablation.  12. Borderline diastolic hypertension.  13. Left atrial enlargement.  14. History of pseudogout.   CONSULTS:  GI   PROCEDURE:  Urgent upper esophagogastroduodenoscopy performed on April 12, 2004 showing a duodenal ulcer with acute hemorrhage status post hemostasis  with epinephrine and clips.   DISCHARGE MEDICATIONS:  1.  He is to continue Lanoxin 0.25 mg once daily.  2.  Altace 5 mg once daily.  3.  No aspirin and no vitamin D and no Coumadin until further notice.  4.  Zocor 20 mg q.h.s.  5.  He is to resume furosemide 40 mg daily and 20 mEq of potassium as needed      as before.  6.  Multivitamin and vitamin C are to be resumed.  Other vitamins may be      resumed as well.  7.  Toprol XL 50 mg one-half tablet daily.  8.  Protonix 40 mg one  tablet b.i.d.  9.  Iron pill daily.  10. Tylenol as needed.  11. He is to avoid all NSAIDs.   HISTORY OF PRESENT ILLNESS:  [name] Page is a pleasant 65 year old gentleman  who presented to the office on April 12, 2004 with several episodes of  melanotic stool.  He had had at least four episodes prior to coming to the  office.  His INR was therapeutic at 2.8 at this time.  He did report some  dizziness.  He had no history of NSAID use or past history of peptic ulcer  disease.  In the office he was found to have markedly heme-positive dark  stool in the rectal vault and hemoglobin was found to have dropped  approximately 3.5-4 g from his baseline.  He is therefore admitted to the  hospital for further management.   HOSPITAL COURSE:  [name] Page was admitted to a telemetry bed.  GI was  consulted and urgently took him to the endoscopy suite where he was found to  have an actively bleeding acute duodenal ulcer.  This was treated with  hemostasis obtained.  [name] Page did lose quite a bit of blood and required  a transfusion of 4 units of fresh frozen plasma, 5 or 6 units of packed red  blood cells as well as vitamin K given over several doses.  He remained  relatively hemodynamically stable during the entire admission.  He did have  one episode of 8 beats of ventricular tachycardia noted on his first day of  admission with no recurrence of this.  This was asymptomatic.  By April 17, 2004 he was deemed stable for discharge home with continued outpatient  follow-up.   PHYSICAL EXAMINATION:  VITAL SIGNS:  Temperature 98.6, pulse 64, respiratory  rate 20, blood pressure 117/69, 97% on room air.  GENERAL:  He was alert, in no acute distress.  HEART:  Regular.  ABDOMEN:  Soft and nontender with normoactive bowel sounds.  No distention.  No masses.  EXTREMITIES:  There was no peripheral edema.  LUNGS:  Clear.   DISCHARGE LABORATORIES:  Hemoglobin 9.2.  INR 1.3.  BMET was within normal.   White  count 7.8, platelet count 202,000, hematocrit 26.6.  PT 15.2 seconds.  Liver function tests on admission were normal.  TSH was 1.202.  Digoxin 0.5  on admission.  H. pylori antibody was negative on the serum sample.   DISCHARGE INSTRUCTIONS:  [name] Page is to be up as tolerated.  He is to  follow a low salt, low fat, low cholesterol diet as before.  He is to call  with any recurrent problems.  He is to keep a follow-up visit with Dr.  Elease Hashimoto  on April 23, 2004.  Furthermore, he is to follow up with Dr. Jacky Kindle in two  weeks and to follow up with Dr. Marina Goodell from [facility] in 7-10 days after  discharge and he is also to follow up with Dr. Graciela Husbands in three to four weeks  after discharge.                                                Mark A. Waynard Edwards, M.D.    MAP/MEDQ  D:  06/21/2004  T:  06/22/2004  Job:  [id number]   cc:   Wilhemina Bonito. Marina Goodell, M.D. [facility]   Geoffry Paradise, M.D.  [address]  [place]  [place] [address]  Fax: [phone]   Vesta Mixer, M.D.  [address]  [place]  [place] [address]  Fax: [phone]   Duke Salvia, M.D.

## 2011-02-28 NOTE — H&P (Signed)
NAME:  [name] Page, [name] Page                          ACCOUNT NO.:  [id number]   MEDICAL RECORD NO.:  [id number]                   PATIENT TYPE:  INP   LOCATION:  4706                                 FACILITY:  [facility]   PHYSICIAN:  Mark A. Perini, M.D.                DATE OF BIRTH:  May 26, 1946   DATE OF ADMISSION:  04/12/2004  DATE OF DISCHARGE:                                HISTORY & PHYSICAL   HISTORY OF PRESENT ILLNESS:  [name] Page is a pleasant 65 year old gentleman  with a past medical history significant for a dilated cardiomyopathy that is  apparently idiopathic, chronic atrial fibrillation, chronic Coumadin  therapy, and hyperlipidemia.  He has tolerated his Coumadin therapy quite  well over the years.  He has had no admissions for congestive heart failure  in the last several years to his recollection.  It is unclear why he takes  an aspirin along with his Coumadin.  However, he was in his usual state of  health until the day prior to admission when he developed several episodes  of melanotic stool. The stool was black and maroon and tarry.  He has had at  least four episodes prior to presenting to [place].  He presented to our Coumadin  clinic today and was found to have an INR of 2.8.  He does report some  dizziness.  He has no history of gastroesophageal reflux disease.  No  history of peptic ulcer disease.  He has no history of coronary disease.  He  does have a pacemaker.  He denies any use of any NSAIDs or other anti-  inflammatory type drugs.  He has been under some stress recently per his  report.   In the office he was found to have dark stool in the rectal vault that was  markedly heme positive the hemoccult testing.  Furthermore he has dropped  his hemoglobin from a baseline of 15 to a level of 11.6 today.  His baseline  was from March 2005.  Given these findings, it was elected to admit him to  the hospital for further management.   PAST MEDICAL HISTORY:  1. Chronic atrial  fibrillation.  2. Idiopathic dilated cardiomyopathy.  3. History of pacemaker placement.  4. Hyperlipidemia.  5. History of arthritis.  6. History of a colon polyp found in the cecum by colonoscopy by Dr. Marina Goodell     in December 2004.  Pathology showed no adenomatous changes.  7. Left ventricular hypertrophy with normal systolic function by an     echocardiogram in March 2004.  8. Past history of AV node ablation.  9. Hyperlipidemia.  10.      History of borderline diastolic hypertension.  11.      Left atrial enlargement.  12.      History of pseudogout.   ALLERGIES:  No known allergies.   MEDICATIONS:  1. Lanoxin 0.25 mg daily.  2. Altace 10 mg daily.  3. Coumadin per protocol.  Most recent dosing was 5 mg daily except 7.5 mg     on Monday, Wednesday and Friday.  4. Zocor 20 mg each evening.  5. As needed furosemide 40 mg and 20 mEq potassium only used a few times a     month.  6. Aspirin 81 mg daily.  7. Multivitamins daily.  8. Vitamin C daily.  9. Vitamin E daily.  10.      L-Carnitine daily.  11.      Coenzyme q.10.h. daily.  12.      Toprol-XL 50 mg daily  13.      Fish oil tablet daily.  14.      L-Taurine 200 mg daily.   SOCIAL HISTORY:  He is married.  He is a dye Cytogeneticist.  No tobacco history.  No  significant alcohol history.  No drug use history.  He has no children.   FAMILY HISTORY:  Notable for a brother who committed suicide.   REVIEW OF SYSTEMS:  As per the history of present illness, the patient  denies any chest pains or shortness of breath.  He has had no fevers with  this.   PHYSICAL EXAMINATION:  VITAL SIGNS:  Blood pressure 120/68, pulse 68, weight  222 pounds.  GENERAL:  He is slightly pale.  He in no acute distress, alert and oriented  x4.  NEUROLOGIC:  Intact.  LUNGS:  Clear the auscultation bilaterally.  HEART:  Regular rate and rhythm with no murmur, rub, or gallop auscultated  today.  ABDOMEN:  Soft and nontender with normoactive bowel  sounds.  RECTAL:  Dark stool in the vault that is very markedly heme positive to  hemoccult testing.  EXTREMITIES:  There is no peripheral edema.  Good distal pulses throughout.   LABORATORY DATA:  Digoxin level 0.5, phosphorus 2.5, magnesium 2.1, PTT 38,  INR now 3.1.  Sodium 139, potassium 4, chloride 108, CO2 26, BUN 26,  creatinine 0.8, glucose 91, total bilirubin 0.5, alk phos 46, AST 21, ALT  24, total protein 6, albumin 3.6, calcium 8.5.  White count 9.5, hemoglobin  11.4, with an MCV of 89.9, platelet count 276,000, 62% segs, 28% lymphs, 9%  monocytes.   ASSESSMENT/PLAN:  Acute upper gastrointestinal bleeding with melena in a  patient on Coumadin and aspirin therapy.  We will hold the Coumadin and  aspirin therapy.  We will admit to telemetry bed.  We will ask for a  gastroenterology consultation.  We will check serial CBCs.  We will continue  his other home medications to the best of our ability.  He will likely need  some sort of endoscopic evaluation at some point during this hospital stay  of as an outpatient if he stabilizes quickly.                                                Mark A. Waynard Edwards, M.D.    MAP/MEDQ  D:  04/12/2004  T:  04/12/2004  Job:  [id number]   cc:   Geoffry Paradise, M.D.  [address]  [place]  [place] [address]  Fax: [phone]   Wilhemina Bonito. Marina Goodell, M.D. [facility]

## 2011-03-20 ENCOUNTER — Encounter: Payer: Self-pay | Admitting: Internal Medicine

## 2011-04-23 ENCOUNTER — Encounter: Payer: Self-pay | Admitting: Internal Medicine

## 2011-04-29 ENCOUNTER — Ambulatory Visit (INDEPENDENT_AMBULATORY_CARE_PROVIDER_SITE_OTHER): Payer: 59 | Admitting: Internal Medicine

## 2011-04-29 ENCOUNTER — Encounter: Payer: Self-pay | Admitting: Internal Medicine

## 2011-04-29 DIAGNOSIS — Z950 Presence of cardiac pacemaker: Secondary | ICD-10-CM

## 2011-04-29 DIAGNOSIS — I442 Atrioventricular block, complete: Secondary | ICD-10-CM

## 2011-04-29 DIAGNOSIS — I5022 Chronic systolic (congestive) heart failure: Secondary | ICD-10-CM

## 2011-04-29 DIAGNOSIS — I4891 Unspecified atrial fibrillation: Secondary | ICD-10-CM

## 2011-04-29 LAB — PACEMAKER DEVICE OBSERVATION
BATTERY VOLTAGE: 2.9478 V
BRDY-0002RV: 70 {beats}/min
DEVICE MODEL PM: 2227415
VENTRICULAR PACING PM: 100

## 2011-04-29 NOTE — Assessment & Plan Note (Signed)
His biventricular pacemaker is working normally. Battery longevity is 6-8 years. We'll plan to see the patient back in several months.

## 2011-04-29 NOTE — Assessment & Plan Note (Signed)
His ventricular rate is well controlled after A-V node ablation. He will continue his current medical therapy including warfarin.

## 2011-04-29 NOTE — Assessment & Plan Note (Signed)
His symptoms are well controlled. He will continue his current medical therapy and maintain a low-sodium diet.

## 2011-04-29 NOTE — Progress Notes (Signed)
HPI [name] Page returns today for followup. He is a very pleasant 65 year old man with a nonischemic cardiomyopathy, chronic atrial fibrillation, status post AV node ablation and permanent pacemaker insertion. The patient developed worsening left ventricular dysfunction and congestive heart failure symptoms and underwent upgrade to a biventricular pacemaker in 2010. Since then his heart failure has improved from class III to class I. The patient denies chest pain, shortness of breath, or peripheral edema. He is very active golfing on a regular basis. He states that despite the intense heat, he is able to walk 9 holes without much difficulty. He has had no syncope and denies tachypalpitations. No Known Allergies   Current Outpatient Prescriptions  Medication Sig Dispense Refill  . carvedilol (COREG) 25 MG tablet Take 25 mg by mouth 2 (two) times daily.        . furosemide (LASIX) 40 MG tablet Take 40 mg by mouth as needed.        . hydrochlorothiazide 25 MG tablet Take 25 mg by mouth daily.        Marland Kitchen omeprazole (PRILOSEC) 20 MG capsule Take 20 mg by mouth 2 (two) times daily.        . potassium chloride SA (K-DUR,KLOR-CON) 20 MEQ tablet Take 20 mEq by mouth as needed.        . ramipril (ALTACE) 10 MG capsule Take 10 mg by mouth 2 (two) times daily.        . rosuvastatin (CRESTOR) 40 MG tablet Take 20 mg by mouth daily.        . traMADol (ULTRAM) 50 MG tablet Take 50 mg by mouth every 8 (eight) hours as needed.        . warfarin (COUMADIN) 5 MG tablet As directed: 5 mg everyday except Monday and Thursday; 7.5 mg Monday and Thursday.          Past Medical History  Diagnosis Date  . Chronic atrial fibrillation     s/p AV nodal ablation and on Coumadin. Patient developed dyspnea/fatigue with RV  pacing and an LV lead was placed  . Cardiomyopathy     Nonishemic, possibly tachycardia-mediated. 1997 LHC with no significant CAD. Myoview (7/09) showed EF 44% with possible mild inferior ischemia. Echo  (9/10): EF 60%  . Upper GI bleed     With duodenal ulcer in 2005.  Patient was on ASA and coumadin at that time. ASA was stopped..  . Pseudogout   . Osteoarthritis of right shoulder region   . Hyperlipidemia   . HTN (hypertension)   . Hyperplastic colon polyp   . Anal fistula   . Pacemaker     St. Jude CRT-P device    ROS:   All systems reviewed and negative except as noted in the HPI.   Past Surgical History  Procedure Date  . Pacemaker insertion     St. Jude's  . Treatment fistula anal   . Shoulder surgery     Right     Family History  Problem Relation Age of Onset  . Heart disease Mother   . Colon cancer Neg Hx      History   Social History  . Marital Status: Married    Spouse Name: N/A    Number of Children: N/A  . Years of Education: N/A   Occupational History  . Not on file.   Social History Main Topics  . Smoking status: Never Smoker   . Smokeless tobacco: Not on file  . Alcohol Use: No  Very rare  . Drug Use: No  . Sexually Active: Not on file   Other Topics Concern  . Not on file   Social History Narrative   Lives in [place].Does a lot of traveling.Exercises daily.Daily Caffeine use.     BP 127/75  Pulse 69  Ht 6\' 1"  (1.854 m)  Wt 241 lb (109.317 kg)  BMI 31.80 kg/m2  Physical Exam:  Well appearing NAD HEENT: Unremarkable Neck:  No JVD, no thyromegally Lymphatics:  No adenopathy Back:  No CVA tenderness Lungs:  Clear. Well-healed pacemaker incision. HEART:  Regular rate rhythm, no murmurs, no rubs, no clicks Abd:  soft, positive bowel sounds, no organomegally, no rebound, no guarding Ext:  2 plus pulses, no edema, no cyanosis, no clubbing Skin:  No rashes no nodules Neuro:  CN II through XII intact, motor grossly intact  DEVICE  Normal device function.  See PaceArt for details.   Assess/Plan:

## 2011-04-29 NOTE — Patient Instructions (Signed)
Your physician wants you to follow-up in: 12 months with Dr. [name]. You will receive a reminder letter in the mail two months in advance. If you don't receive a letter, please call our office to schedule the follow-up appointment.    

## 2011-07-31 ENCOUNTER — Ambulatory Visit (INDEPENDENT_AMBULATORY_CARE_PROVIDER_SITE_OTHER): Payer: BC Managed Care – PPO | Admitting: *Deleted

## 2011-07-31 DIAGNOSIS — Z950 Presence of cardiac pacemaker: Secondary | ICD-10-CM

## 2011-07-31 DIAGNOSIS — I442 Atrioventricular block, complete: Secondary | ICD-10-CM

## 2011-07-31 DIAGNOSIS — I4891 Unspecified atrial fibrillation: Secondary | ICD-10-CM

## 2011-08-01 ENCOUNTER — Other Ambulatory Visit: Payer: Self-pay | Admitting: Internal Medicine

## 2011-08-01 ENCOUNTER — Encounter: Payer: Self-pay | Admitting: Internal Medicine

## 2011-08-01 LAB — REMOTE PACEMAKER DEVICE
LV LEAD THRESHOLD: 0.625 V
RV LEAD IMPEDENCE PM: 440 Ohm
RV LEAD THRESHOLD: 0.875 V

## 2011-08-05 ENCOUNTER — Ambulatory Visit (INDEPENDENT_AMBULATORY_CARE_PROVIDER_SITE_OTHER): Payer: BC Managed Care – PPO | Admitting: Cardiology

## 2011-08-05 ENCOUNTER — Encounter: Payer: Self-pay | Admitting: Cardiology

## 2011-08-05 DIAGNOSIS — I5022 Chronic systolic (congestive) heart failure: Secondary | ICD-10-CM

## 2011-08-05 DIAGNOSIS — I4891 Unspecified atrial fibrillation: Secondary | ICD-10-CM

## 2011-08-05 DIAGNOSIS — Z950 Presence of cardiac pacemaker: Secondary | ICD-10-CM

## 2011-08-05 DIAGNOSIS — I10 Essential (primary) hypertension: Secondary | ICD-10-CM

## 2011-08-05 NOTE — Assessment & Plan Note (Signed)
He continues to follow with Dr. Ladona Ridgel for this.

## 2011-08-05 NOTE — Progress Notes (Signed)
HPI The patient returns for follow up of his cardiomyopathy and fibrillation.  Since I last saw him he has done well.  The patient denies any new symptoms such as chest discomfort, neck or arm discomfort. There has been no new shortness of breath, PND or orthopnea. There have been no reported palpitations, presyncope or syncope.  He does exercise routinely.    No Known Allergies  Current Outpatient Prescriptions  Medication Sig Dispense Refill  . carvedilol (COREG) 25 MG tablet Take 25 mg by mouth 2 (two) times daily.        . furosemide (LASIX) 40 MG tablet Take 40 mg by mouth as needed.        . hydrochlorothiazide 25 MG tablet Take 25 mg by mouth daily.        Marland Kitchen omeprazole (PRILOSEC) 20 MG capsule Take 20 mg by mouth 2 (two) times daily.        . potassium chloride SA (K-DUR,KLOR-CON) 20 MEQ tablet Take 20 mEq by mouth as needed.        . ramipril (ALTACE) 10 MG capsule Take 10 mg by mouth 2 (two) times daily.        . rosuvastatin (CRESTOR) 40 MG tablet Take 20 mg by mouth daily.        . traMADol (ULTRAM) 50 MG tablet Take 50 mg by mouth every 8 (eight) hours as needed.        . warfarin (COUMADIN) 5 MG tablet As directed: 5 mg everyday except Monday and Thursday; 7.5 mg Monday and Thursday.         Past Medical History  Diagnosis Date  . Chronic atrial fibrillation     s/p AV nodal ablation and on Coumadin. Patient developed dyspnea/fatigue with RV  pacing and an LV lead was placed  . Cardiomyopathy     Nonishemic, possibly tachycardia-mediated. 1997 LHC with no significant CAD. Myoview (7/09) showed EF 44% with possible mild inferior ischemia. Echo (9/10): EF 60%  . Upper GI bleed     With duodenal ulcer in 2005.  Patient was on ASA and coumadin at that time. ASA was stopped..  . Pseudogout   . Osteoarthritis of right shoulder region   . Hyperlipidemia   . HTN (hypertension)   . Hyperplastic colon polyp   . Anal fistula   . Pacemaker     St. Jude CRT-P device    Past  Surgical History  Procedure Date  . Pacemaker insertion     St. Jude's  . Treatment fistula anal   . Shoulder surgery     Right    ROS:  As stated in the HPI and negative for all other systems.  PHYSICAL EXAM BP 118/70  Pulse 69  Resp 18  Ht 6\' 2"  (1.88 m)  Wt 239 lb 12.8 oz (108.773 kg)  BMI 30.79 kg/m2 GENERAL:  Well appearing NECK:  No jugular venous distention, waveform within normal limits, carotid upstroke brisk and symmetric, no bruits, no thyromegaly LYMPHATICS:  No cervical, inguinal adenopathy LUNGS:  Clear to auscultation bilaterally BACK:  No CVA tenderness CHEST:  Pacemaker site  HEART:  PMI not displaced or sustained,S1 and S2 within normal limits, no S3, no clicks, no rubs, no murmurs ABD:  Flat, positive bowel sounds normal in frequency in pitch, no bruits, no rebound, no guarding, no midline pulsatile mass, no hepatomegaly, no splenomegaly EXT:  2 plus pulses throughout, no edema, no cyanosis no clubbing NEURO:  Cranial nerves II through XII grossly  intact, motor grossly intact throughout PSYCH:  Cognitively intact, oriented to person place and time  EKG:  Atrial fibrillation with ventricular pacing.  ASSESSMENT AND PLAN

## 2011-08-05 NOTE — Patient Instructions (Signed)
Follow up with Dr Antoine Poche as needed  Continue to follow up with Dr Ladona Ridgel as scheduled  The current medical regimen is effective;  continue present plan and medications.

## 2011-08-05 NOTE — Assessment & Plan Note (Signed)
The blood pressure is at target. No change in medications is indicated. We will continue with therapeutic lifestyle changes (TLC).  

## 2011-08-05 NOTE — Assessment & Plan Note (Signed)
His EF in 2010 was 60%. No change in therapy is indicated.

## 2011-08-05 NOTE — Assessment & Plan Note (Signed)
He tolerates this rhythm and rate control and anticoagulation. We will continue with the meds as listed.  He does not want to switch to Pradaxa.

## 2011-08-12 ENCOUNTER — Encounter: Payer: Self-pay | Admitting: *Deleted

## 2011-08-12 NOTE — Progress Notes (Signed)
Pacer remote check  

## 2011-10-30 ENCOUNTER — Other Ambulatory Visit: Payer: Self-pay | Admitting: Internal Medicine

## 2011-10-30 ENCOUNTER — Encounter: Payer: Self-pay | Admitting: Internal Medicine

## 2011-10-30 ENCOUNTER — Ambulatory Visit (INDEPENDENT_AMBULATORY_CARE_PROVIDER_SITE_OTHER): Payer: BC Managed Care – PPO | Admitting: *Deleted

## 2011-10-30 DIAGNOSIS — I442 Atrioventricular block, complete: Secondary | ICD-10-CM

## 2011-10-30 DIAGNOSIS — I4891 Unspecified atrial fibrillation: Secondary | ICD-10-CM

## 2011-10-30 DIAGNOSIS — Z950 Presence of cardiac pacemaker: Secondary | ICD-10-CM

## 2011-10-30 LAB — REMOTE PACEMAKER DEVICE: RV LEAD THRESHOLD: 0.75 V

## 2011-11-05 ENCOUNTER — Encounter: Payer: Self-pay | Admitting: *Deleted

## 2011-11-10 NOTE — Progress Notes (Signed)
Remote pacer check  

## 2012-01-29 ENCOUNTER — Ambulatory Visit (INDEPENDENT_AMBULATORY_CARE_PROVIDER_SITE_OTHER): Payer: BC Managed Care – PPO | Admitting: *Deleted

## 2012-01-29 ENCOUNTER — Encounter: Payer: Self-pay | Admitting: Internal Medicine

## 2012-01-29 DIAGNOSIS — I442 Atrioventricular block, complete: Secondary | ICD-10-CM

## 2012-01-29 DIAGNOSIS — I4891 Unspecified atrial fibrillation: Secondary | ICD-10-CM

## 2012-01-29 LAB — REMOTE PACEMAKER DEVICE: DEVICE MODEL PM: 2227415

## 2012-02-04 ENCOUNTER — Encounter: Payer: Self-pay | Admitting: *Deleted

## 2012-02-09 NOTE — Progress Notes (Signed)
Remote pacer check  

## 2012-05-15 IMAGING — RF DG FLUORO GUIDE NDL PLC/BX
1 series · 1 of 1 positions shown · non-contrast
Comparison: None.

CLINICAL DATA: Right knee pain.  Evaluate for meniscal tear.

FLUORO GUIDED NEEDLE PLACEMENT

[Series 1: (hospital) · 1 of 1 slices shown]
[im 1/1]
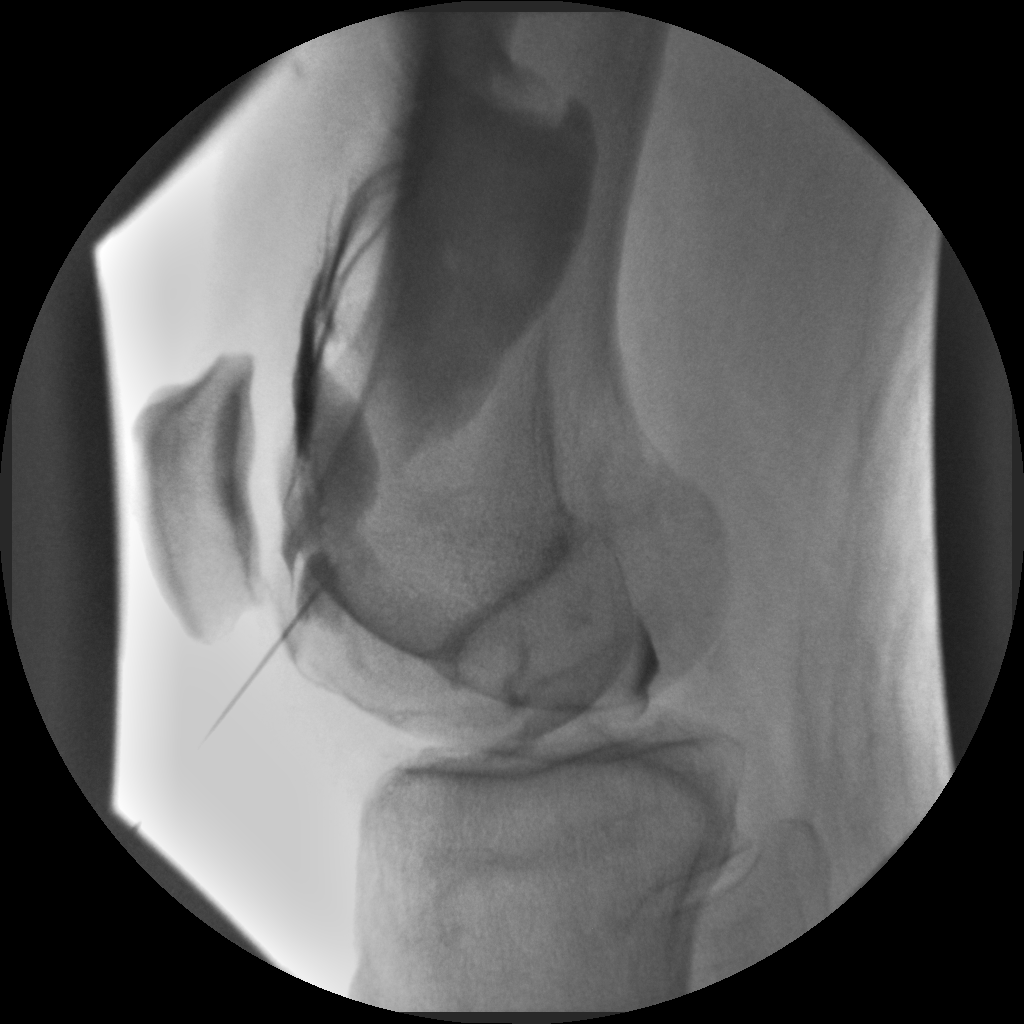

[1 of 1 positions shown; findings below may reference images not displayed]

FINDINGS: After thorough discussion of risks and benefits of the
procedure, written and verbal consent was obtained by Dr. Shimiman.
Risks of the procedure included bleeding, infection, injury to
nerves, blood vessels, and adjacent structures.

Radiographic localization performed and a target site inferior and
medial to the patella was chosen.  The area was marked.  The area
was prepped and draped in the usual sterile fashion.  The under
fluoroscopic guidance the skin was anesthetized with 1% lidocaine
without epinephrine.  20 gauge needle was advanced into the joint
and 60 ml of bloody effusion was aspirated.  This was discarded.

Subsequently, 50% Joshjax and 50% 1% lidocaine solution was infused
into the joint for total volume of 40 ml.  A bandage was applied to
the suprapatellar pouch to force contrast into the joint and
patient was taken to CT for scanning after the wound was dressed
and the needle removed.  The patient tolerated the procedure with
no complication.
IMPRESSION: Successful right knee injection for CT arthrogram.

## 2012-05-15 IMAGING — CT CT EXTREM LOW W/ CM*R*
3 of 4 series · 11 of 20 positions shown, 13 images · IV contrast (agent unspecified)
Comparison: None

CLINICAL DATA: Right knee pain.

CT RIGHT KNEE WITH CONTRAST
TECHNIQUE: Multidetector CT imaging of the right knee was
performed according to the standard protocol following intra-
articular contrast administration. Multiplanar CT image
reconstructions were also generated.
Contrast: Please see injection dictation for contrast amount.

[Series 2: knee bone · axial · 0.37mm/px · z∈[-88,+96]mm · 5 of 112 slices shown, 7 images]
[im 19/112  soft-tissue]
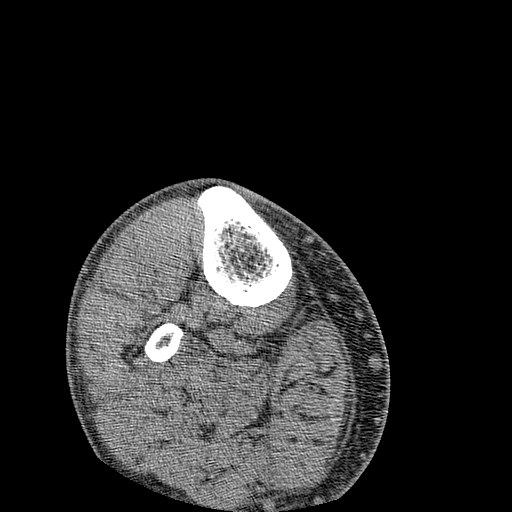
[im 19/112  bone]
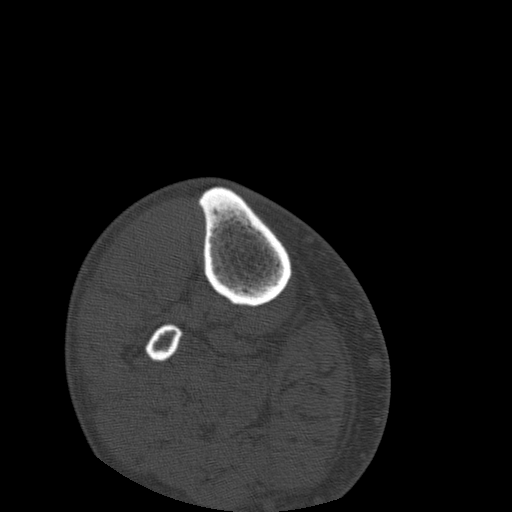
[im 38/112  bone]
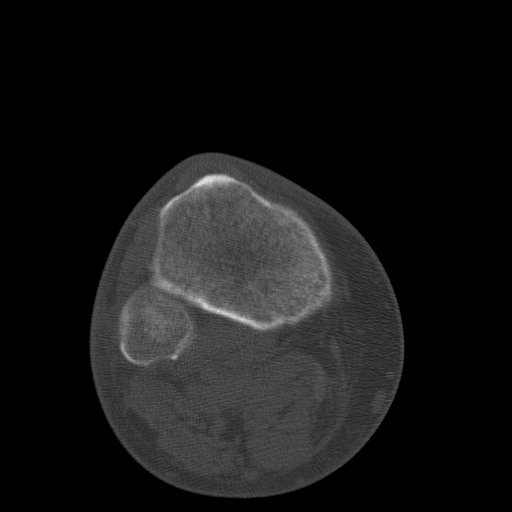
[im 56/112  bone]
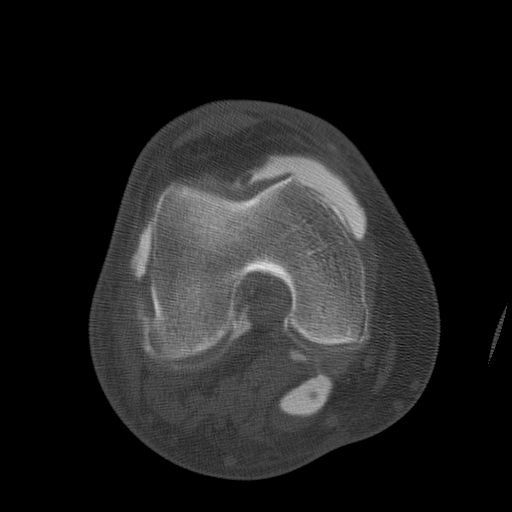
[im 75/112  bone]
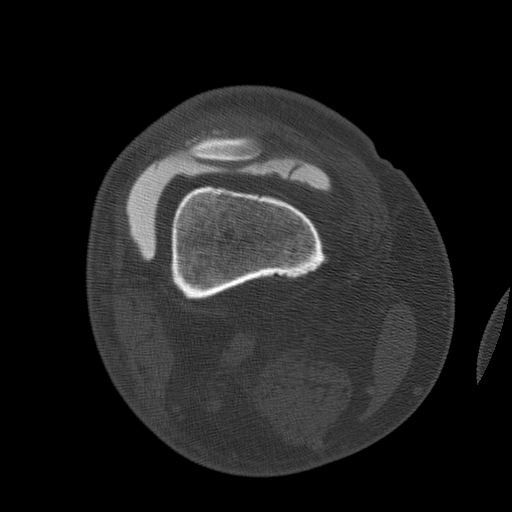
[im 93/112  soft-tissue]
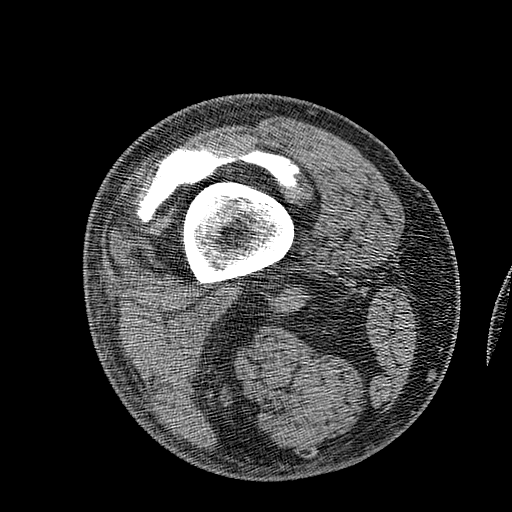
[im 93/112  bone]
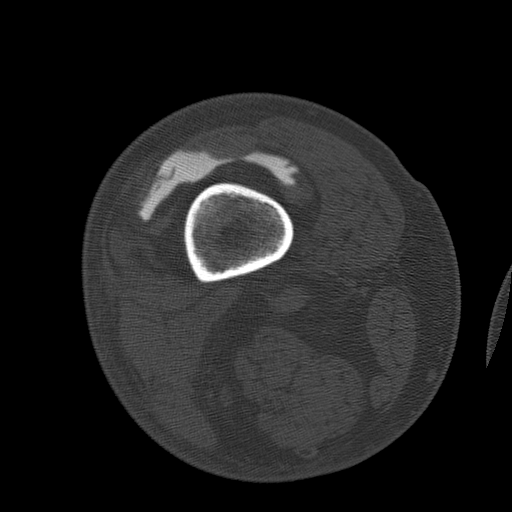

[Series 3: knee detail · axial · 0.37mm/px · z∈[-88,+96]mm · 5 of 112 slices shown]
[im 19/112  bone]
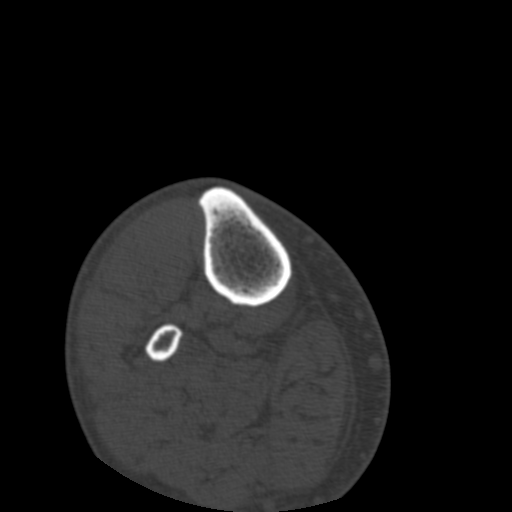
[im 38/112  bone]
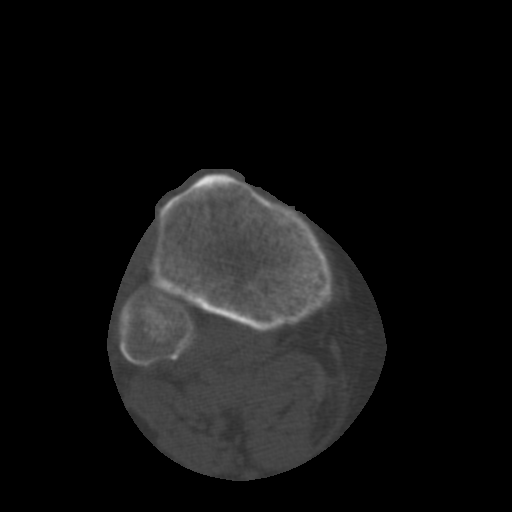
[im 56/112  bone]
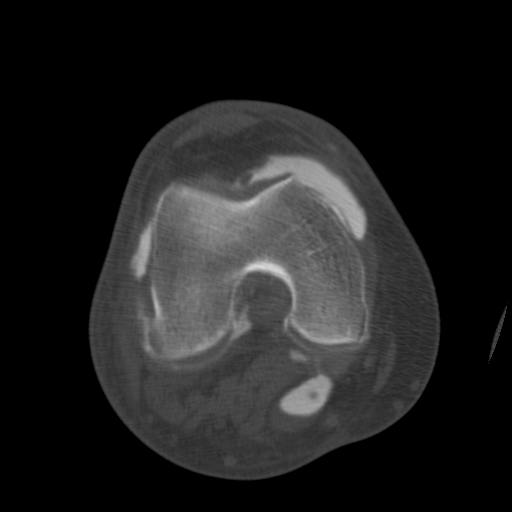
[im 75/112  bone]
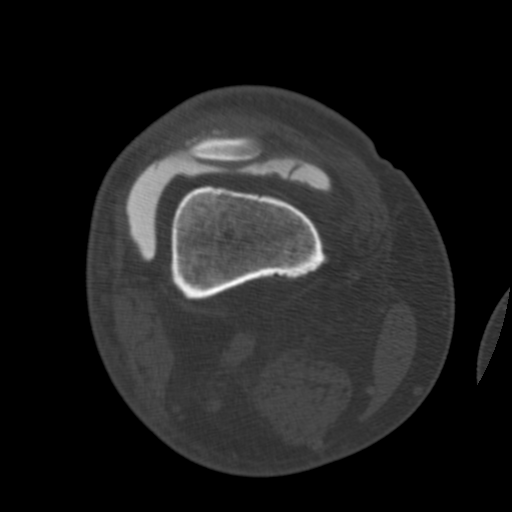
[im 93/112  bone]
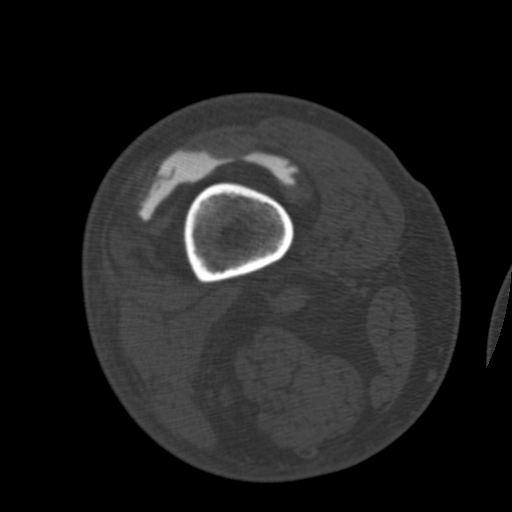

[Series 103: cor st · coronal · 0.56mm/px · 1 of 73 slices shown]
[im 37/73  bone]
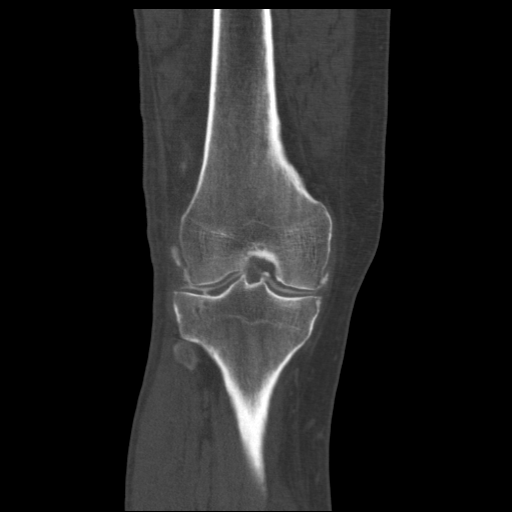

[11 of 20 positions shown; findings below may reference images not displayed]

FINDINGS: No acute bony findings.  There are tricompartmental
degenerative changes with joint space narrowing and mild
osteophytic spurring.  Subchondral cystic change noted in the
lateral tibial plateau. Areas of cartilage irregularity are noted
without obvious osteochondral lesion.

There is a moderate sized ganglion cyst.

The cruciate and collateral ligaments appear intact.  The menisci
demonstrate extensive degenerative changes and there are tears
involving the posterior horn of the lateral meniscus and the
midbody region of the medial meniscus.
IMPRESSION: 1.  Tricompartmental degenerative changes.
2.  Medial and lateral meniscal tears and degeneration.
3.  No acute bony findings.

## 2012-05-18 ENCOUNTER — Encounter: Payer: Self-pay | Admitting: Internal Medicine

## 2012-05-18 ENCOUNTER — Ambulatory Visit (INDEPENDENT_AMBULATORY_CARE_PROVIDER_SITE_OTHER): Payer: BC Managed Care – PPO | Admitting: Internal Medicine

## 2012-05-18 VITALS — BP 122/80 | HR 76 | Ht 74.0 in | Wt 243.8 lb

## 2012-05-18 DIAGNOSIS — I10 Essential (primary) hypertension: Secondary | ICD-10-CM

## 2012-05-18 DIAGNOSIS — I4891 Unspecified atrial fibrillation: Secondary | ICD-10-CM

## 2012-05-18 DIAGNOSIS — I5022 Chronic systolic (congestive) heart failure: Secondary | ICD-10-CM

## 2012-05-18 DIAGNOSIS — I442 Atrioventricular block, complete: Secondary | ICD-10-CM

## 2012-05-18 LAB — PACEMAKER DEVICE OBSERVATION
BATTERY VOLTAGE: 2.9478 V
DEVICE MODEL PM: 2227415
RV LEAD IMPEDENCE PM: 412.5 Ohm
VENTRICULAR PACING PM: 99

## 2012-05-18 NOTE — Patient Instructions (Signed)
Your physician wants you to follow-up in: 12 months with Dr [name] You will receive a reminder letter in the mail two months in advance. If you don't receive a letter, please call our office to schedule the follow-up appointment.  Remote monitoring is used to monitor your Pacemaker of ICD from home. This monitoring reduces the number of office visits required to check your device to one time per year. It allows us to keep an eye on the functioning of your device to ensure it is working properly. You are scheduled for a device check from home on 08/23/12. You may send your transmission at any time that day. If you have a wireless device, the transmission will be sent automatically. After your physician reviews your transmission, you will receive a postcard with your next transmission date.    

## 2012-05-18 NOTE — Assessment & Plan Note (Signed)
His ventricular rate is well controlled. No change in meds. 

## 2012-05-18 NOTE — Progress Notes (Signed)
HPI [name] Page returns today for followup. He is a pleasant 66 yo man with chronic atrial fib, HTN, CHB, CHF, s/p BiV PPM. He has done well in the interim. He remains active at work. No syncope, chest pain or edema. CHF symptoms are class 1-2.  No Known Allergies   Current Outpatient Prescriptions  Medication Sig Dispense Refill  . carvedilol (COREG) 25 MG tablet Take 25 mg by mouth 2 (two) times daily.        . furosemide (LASIX) 40 MG tablet Take 40 mg by mouth as needed.        . hydrochlorothiazide 25 MG tablet Take 25 mg by mouth daily.        Marland Kitchen omeprazole (PRILOSEC) 20 MG capsule Take 20 mg by mouth 2 (two) times daily.        . potassium chloride SA (K-DUR,KLOR-CON) 20 MEQ tablet Take 20 mEq by mouth as needed.        . ramipril (ALTACE) 10 MG capsule Take 10 mg by mouth 2 (two) times daily.        . rosuvastatin (CRESTOR) 40 MG tablet Take 20 mg by mouth daily.        . traMADol (ULTRAM) 50 MG tablet Take 50 mg by mouth every 8 (eight) hours as needed.        . warfarin (COUMADIN) 5 MG tablet As directed: 5 mg everyday except Monday and Thursday; 7.5 mg Monday and Thursday.          Past Medical History  Diagnosis Date  . Chronic atrial fibrillation     s/p AV nodal ablation and on Coumadin. Patient developed dyspnea/fatigue with RV  pacing and an LV lead was placed  . Cardiomyopathy     Nonishemic, possibly tachycardia-mediated. 1997 LHC with no significant CAD. Myoview (7/09) showed EF 44% with possible mild inferior ischemia. Echo (9/10): EF 60%  . Upper GI bleed     With duodenal ulcer in 2005.  Patient was on ASA and coumadin at that time. ASA was stopped..  . Pseudogout   . Osteoarthritis of right shoulder region   . Hyperlipidemia   . HTN (hypertension)   . Hyperplastic colon polyp   . Anal fistula   . Pacemaker     St. Jude CRT-P device    ROS:   All systems reviewed and negative except as noted in the HPI.   Past Surgical History  Procedure Date  .  Pacemaker insertion     St. Jude's  . Treatment fistula anal   . Shoulder surgery     Right     Family History  Problem Relation Age of Onset  . Heart disease Mother   . Colon cancer Neg Hx      History   Social History  . Marital Status: Married    Spouse Name: N/A    Number of Children: N/A  . Years of Education: N/A   Occupational History  . Not on file.   Social History Main Topics  . Smoking status: Never Smoker   . Smokeless tobacco: Not on file  . Alcohol Use: No     Very rare  . Drug Use: No  . Sexually Active: Not on file   Other Topics Concern  . Not on file   Social History Narrative   Lives in [place].Does a lot of traveling.Exercises daily.Daily Caffeine use.     BP 122/80  Pulse 76  Ht 6\' 2"  (1.88 m)  Wt  243 lb 12.8 oz (110.587 kg)  BMI 31.30 kg/m2  Physical Exam:  Well appearing NAD HEENT: Unremarkable Neck:  No JVD, no thyromegally Lungs:  Clear with no wheezes, rales, or rhonchi HEART:  Regular rate rhythm, no murmurs, no rubs, no clicks Abd:  soft, positive bowel sounds, no organomegally, no rebound, no guarding Ext:  2 plus pulses, no edema, no cyanosis, no clubbing Skin:  No rashes no nodules Neuro:  CN II through XII intact, motor grossly intact  DEVICE  Normal device function.  See PaceArt for details.   Assess/Plan:

## 2012-05-18 NOTE — Assessment & Plan Note (Signed)
His blood pressure is well controlled. He will continue his current meds, maintain a low sodium diet and exercise more.

## 2012-05-18 NOTE — Assessment & Plan Note (Signed)
His symptoms are well compensated. He will continue his current meds and maintain a low sodium diet.

## 2012-08-23 ENCOUNTER — Encounter: Payer: Self-pay | Admitting: Internal Medicine

## 2012-08-23 ENCOUNTER — Ambulatory Visit (INDEPENDENT_AMBULATORY_CARE_PROVIDER_SITE_OTHER): Payer: BC Managed Care – PPO | Admitting: *Deleted

## 2012-08-23 DIAGNOSIS — I442 Atrioventricular block, complete: Secondary | ICD-10-CM

## 2012-08-23 DIAGNOSIS — I4891 Unspecified atrial fibrillation: Secondary | ICD-10-CM

## 2012-08-23 LAB — REMOTE PACEMAKER DEVICE
LV LEAD IMPEDENCE PM: 460 Ohm
RV LEAD IMPEDENCE PM: 410 Ohm
VENTRICULAR PACING PM: 100

## 2012-08-31 ENCOUNTER — Encounter: Payer: Self-pay | Admitting: *Deleted

## 2012-11-29 ENCOUNTER — Other Ambulatory Visit: Payer: Self-pay | Admitting: Internal Medicine

## 2012-11-29 ENCOUNTER — Encounter: Payer: Self-pay | Admitting: Internal Medicine

## 2012-11-29 ENCOUNTER — Ambulatory Visit (INDEPENDENT_AMBULATORY_CARE_PROVIDER_SITE_OTHER): Payer: BC Managed Care – PPO | Admitting: *Deleted

## 2012-11-29 DIAGNOSIS — Z950 Presence of cardiac pacemaker: Secondary | ICD-10-CM

## 2012-11-29 DIAGNOSIS — I442 Atrioventricular block, complete: Secondary | ICD-10-CM

## 2012-11-29 LAB — REMOTE PACEMAKER DEVICE
LV LEAD IMPEDENCE PM: 460 Ohm
RV LEAD IMPEDENCE PM: 410 Ohm
VENTRICULAR PACING PM: 100

## 2012-12-15 ENCOUNTER — Encounter: Payer: Self-pay | Admitting: *Deleted

## 2013-02-07 ENCOUNTER — Encounter: Payer: Self-pay | Admitting: Internal Medicine

## 2013-02-28 ENCOUNTER — Encounter: Payer: Self-pay | Admitting: Internal Medicine

## 2013-02-28 ENCOUNTER — Ambulatory Visit (INDEPENDENT_AMBULATORY_CARE_PROVIDER_SITE_OTHER): Payer: BC Managed Care – PPO | Admitting: *Deleted

## 2013-02-28 DIAGNOSIS — Z950 Presence of cardiac pacemaker: Secondary | ICD-10-CM

## 2013-02-28 DIAGNOSIS — I442 Atrioventricular block, complete: Secondary | ICD-10-CM

## 2013-02-28 LAB — REMOTE PACEMAKER DEVICE
DEVICE MODEL PM: 2227415
RV LEAD IMPEDENCE PM: 400 Ohm
RV LEAD THRESHOLD: 0.875 V
VENTRICULAR PACING PM: 100

## 2013-03-03 ENCOUNTER — Encounter: Payer: Self-pay | Admitting: *Deleted

## 2013-03-15 ENCOUNTER — Encounter: Payer: Self-pay | Admitting: Internal Medicine

## 2013-03-15 ENCOUNTER — Ambulatory Visit (INDEPENDENT_AMBULATORY_CARE_PROVIDER_SITE_OTHER): Payer: BC Managed Care – PPO | Admitting: Internal Medicine

## 2013-03-15 VITALS — BP 120/80 | HR 76 | Ht 73.5 in | Wt 264.0 lb

## 2013-03-15 DIAGNOSIS — Z1211 Encounter for screening for malignant neoplasm of colon: Secondary | ICD-10-CM

## 2013-03-15 DIAGNOSIS — Z86010 Personal history of colonic polyps: Secondary | ICD-10-CM

## 2013-03-15 NOTE — Patient Instructions (Addendum)
We have changed your recall colonoscopy date to 2016.  You will receive a letter in the mail reminding you to schedule it.

## 2013-03-15 NOTE — Progress Notes (Signed)
HISTORY OF PRESENT ILLNESS:  [name] Page is a 67 y.o. male with a history of hypertension, hyperlipidemia, upper GI bleed secondary to duodenal ulcer, chronic atrial fibrillation status post AV node ablation and dual-chamber pacemaker placement, on chronic Coumadin therapy. He presents today regarding surveillance colonoscopy. He has a history of colon polyps. Index colonoscopy in 2004 revealed large right-sided hyperplastic lesion. Subsequent followup in 2006 revealed hyperplastic polyps only. His most recent exam was in June 2011. 9 polyps removed. No adenomatous change. Initial followup recommended for 3 years. Patient has had no interval medical problems. Chronic medical problems are stable. GI review of systems is negative. No anemia. No melena or hematochezia. He continues on PPI therapy.  REVIEW OF SYSTEMS:  All non-GI ROS negative upon comprehensive review  Past Medical History  Diagnosis Date  . Chronic atrial fibrillation     s/p AV nodal ablation and on Coumadin. Patient developed dyspnea/fatigue with RV  pacing and an LV lead was placed  . Cardiomyopathy     Nonishemic, possibly tachycardia-mediated. 1997 LHC with no significant CAD. Myoview (7/09) showed EF 44% with possible mild inferior ischemia. Echo (9/10): EF 60%  . Upper GI bleed     With duodenal ulcer in 2005.  Patient was on ASA and coumadin at that time. ASA was stopped..  . Pseudogout   . Osteoarthritis of right shoulder region   . Hyperlipidemia   . HTN (hypertension)   . Hyperplastic colon polyp     and adenomatous  . Anal fistula   . Pacemaker     St. Jude CRT-P device    Past Surgical History  Procedure Laterality Date  . Pacemaker insertion      St. Jude's  . Treatment fistula anal    . Shoulder surgery      Right    Social History [name] Page  reports that he has never smoked. He has never used smokeless tobacco. He reports that he does not drink alcohol or use illicit drugs.  family history  includes Heart disease in his mother.  There is no history of Colon cancer.  No Known Allergies     PHYSICAL EXAMINATION: Vital signs: BP 120/80  Pulse 76  Ht 6' 1.5" (1.867 m)  Wt 264 lb (119.75 kg)  BMI 34.35 kg/m2  SpO2 98% General: Well-developed, well-nourished, no acute distress HEENT: Sclerae are anicteric, conjunctiva pink. Oral mucosa intact Lungs: Clear Heart: Regular Abdomen: soft, nontender, nondistended, no obvious ascites, no peritoneal signs, normal bowel sounds. No organomegaly. Extremities: No edema Psychiatric: alert and oriented x3. Cooperative   ASSESSMENT:  #1. History of multiple hyperplastic polyps. Last colonoscopy 2011 #2. Multiple medical problems. Stable    PLAN:  #1. Based on current guidelines, his followup interval has been revised 5 years since his last exam. I reviewed this with him. He understands. Appropriate change in recall made. Obviously, interval followup as needed. Resume general medical care Dr. Jacky Kindle

## 2013-05-16 ENCOUNTER — Encounter: Payer: Self-pay | Admitting: *Deleted

## 2013-05-17 ENCOUNTER — Ambulatory Visit (INDEPENDENT_AMBULATORY_CARE_PROVIDER_SITE_OTHER): Payer: Medicare Other | Admitting: Internal Medicine

## 2013-05-17 ENCOUNTER — Encounter: Payer: Self-pay | Admitting: Internal Medicine

## 2013-05-17 VITALS — BP 133/84 | HR 109 | Ht 73.0 in | Wt 245.8 lb

## 2013-05-17 DIAGNOSIS — Z950 Presence of cardiac pacemaker: Secondary | ICD-10-CM

## 2013-05-17 DIAGNOSIS — I4891 Unspecified atrial fibrillation: Secondary | ICD-10-CM

## 2013-05-17 DIAGNOSIS — I442 Atrioventricular block, complete: Secondary | ICD-10-CM

## 2013-05-17 DIAGNOSIS — I5022 Chronic systolic (congestive) heart failure: Secondary | ICD-10-CM

## 2013-05-17 LAB — PACEMAKER DEVICE OBSERVATION
BATTERY VOLTAGE: 2.9178 V
DEVICE MODEL PM: 2227415
RV LEAD IMPEDENCE PM: 387.5 Ohm
VENTRICULAR PACING PM: 99

## 2013-05-17 NOTE — Assessment & Plan Note (Signed)
His chronic systolic heart failure is class I. His ejection fraction had nearly normalized previously. He will continue his current medical therapy, and maintain a low-sodium diet.

## 2013-05-17 NOTE — Assessment & Plan Note (Signed)
His ventricular rate is well controlled, status post AV node ablation. He will continue his current medical therapy.

## 2013-05-17 NOTE — Assessment & Plan Note (Signed)
His St. Jude biventricular pacemaker is working normally. We'll plan to recheck in several months. 

## 2013-05-17 NOTE — Patient Instructions (Addendum)

## 2013-05-17 NOTE — Progress Notes (Signed)
HPI [name] Page returns today for followup. He is a pleasant 67 yo man with a h/o HTN, chronic atrial fibrillation, s/p AV node ablation, and PPM. He continues to work full-time. He denies chest pain, shortness of breath, syncope, or peripheral edema. No Known Allergies   Current Outpatient Prescriptions  Medication Sig Dispense Refill  . carvedilol (COREG) 25 MG tablet Take 25 mg by mouth 2 (two) times daily.        . furosemide (LASIX) 40 MG tablet Take 40 mg by mouth as needed.        . hydrochlorothiazide 25 MG tablet Take 25 mg by mouth daily.        Marland Kitchen omeprazole (PRILOSEC) 20 MG capsule Take 20 mg by mouth 2 (two) times daily.        . potassium chloride SA (K-DUR,KLOR-CON) 20 MEQ tablet Take 20 mEq by mouth as needed.        . ramipril (ALTACE) 10 MG capsule Take 10 mg by mouth 2 (two) times daily.        . rosuvastatin (CRESTOR) 40 MG tablet Take 20 mg by mouth daily.        . traMADol (ULTRAM) 50 MG tablet Take 50 mg by mouth as needed for pain.      Marland Kitchen warfarin (COUMADIN) 5 MG tablet Take 5 mg by mouth daily.        No current facility-administered medications for this visit.     Past Medical History  Diagnosis Date  . Chronic atrial fibrillation     s/p AV nodal ablation and on Coumadin. Patient developed dyspnea/fatigue with RV  pacing and an LV lead was placed  . Cardiomyopathy     Nonishemic, possibly tachycardia-mediated. 1997 LHC with no significant CAD. Myoview (7/09) showed EF 44% with possible mild inferior ischemia. Echo (9/10): EF 60%  . Upper GI bleed     With duodenal ulcer in 2005.  Patient was on ASA and coumadin at that time. ASA was stopped..  . Pseudogout   . Osteoarthritis of right shoulder region   . Hyperlipidemia   . HTN (hypertension)   . Hyperplastic colon polyp     and adenomatous  . Anal fistula   . Pacemaker     St. Jude CRT-P device    ROS:   All systems reviewed and negative except as noted in the HPI.   Past Surgical History   Procedure Laterality Date  . Pacemaker insertion      St. Jude's  . Treatment fistula anal    . Shoulder surgery      Right     Family History  Problem Relation Age of Onset  . Heart disease Mother   . Colon cancer Neg Hx      History   Social History  . Marital Status: Married    Spouse Name: N/A    Number of Children: N/A  . Years of Education: N/A   Occupational History  . Not on file.   Social History Main Topics  . Smoking status: Never Smoker   . Smokeless tobacco: Never Used  . Alcohol Use: No     Comment: Very rare  . Drug Use: No  . Sexually Active: Not on file   Other Topics Concern  . Not on file   Social History Narrative   Lives in [place].   Does a lot of traveling.   Exercises daily.   Daily Caffeine use.     BP 133/84  Pulse 109  Ht 6\' 1"  (1.854 m)  Wt 245 lb 12.8 oz (111.494 kg)  BMI 32.44 kg/m2  Physical Exam:  Well appearing middle-aged man, NAD HEENT: Unremarkable Neck:  7 cm JVD, no thyromegally Back:  No CVA tenderness Lungs:  Clear with no wheezes, rales, or rhonchi. Well-healed pacemaker incision. HEART:  Regular rate rhythm, no murmurs, no rubs, no clicks Abd:  soft, positive bowel sounds, no organomegally, no rebound, no guarding Ext:  2 plus pulses, no edema, no cyanosis, no clubbing Skin:  No rashes no nodules Neuro:  CN II through XII intact, motor grossly intact  EKG -atrial fibrillation with biventricular pacing  DEVICE  Normal device function.  See PaceArt for details.   Assess/Plan:

## 2013-08-22 ENCOUNTER — Encounter: Payer: Self-pay | Admitting: Internal Medicine

## 2013-08-22 ENCOUNTER — Encounter: Payer: 59 | Admitting: *Deleted

## 2013-08-22 ENCOUNTER — Other Ambulatory Visit: Payer: Self-pay | Admitting: Internal Medicine

## 2013-08-22 DIAGNOSIS — I442 Atrioventricular block, complete: Secondary | ICD-10-CM

## 2013-08-22 DIAGNOSIS — Z950 Presence of cardiac pacemaker: Secondary | ICD-10-CM

## 2013-08-22 DIAGNOSIS — I4891 Unspecified atrial fibrillation: Secondary | ICD-10-CM

## 2013-08-26 LAB — MDC_IDC_ENUM_SESS_TYPE_REMOTE
Implantable Pulse Generator Model: 3210
Lead Channel Impedance Value: 410 Ohm
Lead Channel Impedance Value: 450 Ohm
Lead Channel Pacing Threshold Amplitude: 0.625 V
Lead Channel Pacing Threshold Pulse Width: 0.5 ms
Lead Channel Sensing Intrinsic Amplitude: 7.6 mV
Lead Channel Setting Pacing Pulse Width: 0.5 ms
Lead Channel Setting Sensing Sensitivity: 4 mV

## 2013-08-30 ENCOUNTER — Encounter: Payer: Self-pay | Admitting: *Deleted

## 2013-11-22 ENCOUNTER — Encounter: Payer: Self-pay | Admitting: Internal Medicine

## 2013-11-22 ENCOUNTER — Ambulatory Visit (INDEPENDENT_AMBULATORY_CARE_PROVIDER_SITE_OTHER): Payer: 59 | Admitting: *Deleted

## 2013-11-22 DIAGNOSIS — I442 Atrioventricular block, complete: Secondary | ICD-10-CM

## 2013-11-22 DIAGNOSIS — I4891 Unspecified atrial fibrillation: Secondary | ICD-10-CM

## 2013-12-01 LAB — MDC_IDC_ENUM_SESS_TYPE_REMOTE
Implantable Pulse Generator Model: 3210
Lead Channel Impedance Value: 410 Ohm
Lead Channel Pacing Threshold Amplitude: 0.75 V
Lead Channel Pacing Threshold Pulse Width: 0.4 ms
Lead Channel Setting Pacing Amplitude: 2 V
Lead Channel Setting Pacing Amplitude: 2 V
Lead Channel Setting Pacing Pulse Width: 0.8 ms
MDC IDC MSMT LEADCHNL LV IMPEDANCE VALUE: 480 Ohm
MDC IDC MSMT LEADCHNL LV PACING THRESHOLD PULSEWIDTH: 0.4 ms
MDC IDC MSMT LEADCHNL RV PACING THRESHOLD AMPLITUDE: 0.875 V
MDC IDC PG SERIAL: 2227415
MDC IDC SET LEADCHNL LV PACING PULSEWIDTH: 0.5 ms
MDC IDC SET LEADCHNL RV SENSING SENSITIVITY: 4 mV
MDC IDC STAT BRADY RV PERCENT PACED: 99 %

## 2013-12-07 ENCOUNTER — Encounter: Payer: Self-pay | Admitting: *Deleted

## 2014-02-22 ENCOUNTER — Ambulatory Visit (INDEPENDENT_AMBULATORY_CARE_PROVIDER_SITE_OTHER): Payer: 59 | Admitting: *Deleted

## 2014-02-22 ENCOUNTER — Encounter: Payer: Self-pay | Admitting: Internal Medicine

## 2014-02-22 DIAGNOSIS — I4891 Unspecified atrial fibrillation: Secondary | ICD-10-CM

## 2014-02-22 NOTE — Progress Notes (Signed)
Remote pacemaker transmission.   

## 2014-02-24 LAB — MDC_IDC_ENUM_SESS_TYPE_REMOTE
Battery Remaining Longevity: 49 mo
Date Time Interrogation Session: 20150513101805
Implantable Pulse Generator Model: 3210
Lead Channel Impedance Value: 480 Ohm
Lead Channel Pacing Threshold Amplitude: 0.625 V
Lead Channel Setting Pacing Amplitude: 2 V
Lead Channel Setting Pacing Amplitude: 2 V
Lead Channel Setting Pacing Pulse Width: 0.8 ms
MDC IDC MSMT BATTERY VOLTAGE: 2.9 V
MDC IDC MSMT LEADCHNL LV PACING THRESHOLD PULSEWIDTH: 0.5 ms
MDC IDC MSMT LEADCHNL RV IMPEDANCE VALUE: 400 Ohm
MDC IDC MSMT LEADCHNL RV PACING THRESHOLD AMPLITUDE: 0.875 V
MDC IDC MSMT LEADCHNL RV PACING THRESHOLD PULSEWIDTH: 0.8 ms
MDC IDC MSMT LEADCHNL RV SENSING INTR AMPL: 5.4 mV
MDC IDC PG SERIAL: 2227415
MDC IDC SET LEADCHNL LV PACING PULSEWIDTH: 0.5 ms
MDC IDC SET LEADCHNL RV SENSING SENSITIVITY: 4 mV

## 2014-03-10 ENCOUNTER — Encounter: Payer: Self-pay | Admitting: Cardiology

## 2014-04-03 ENCOUNTER — Ambulatory Visit (INDEPENDENT_AMBULATORY_CARE_PROVIDER_SITE_OTHER): Payer: Self-pay | Admitting: Neurology

## 2014-04-03 ENCOUNTER — Encounter (INDEPENDENT_AMBULATORY_CARE_PROVIDER_SITE_OTHER): Payer: Self-pay

## 2014-04-03 ENCOUNTER — Encounter: Payer: Self-pay | Admitting: Neurology

## 2014-04-03 VITALS — BP 117/75 | HR 76 | Temp 97.0°F | Ht 73.0 in | Wt 247.0 lb

## 2014-04-03 DIAGNOSIS — Z0289 Encounter for other administrative examinations: Secondary | ICD-10-CM

## 2014-04-03 NOTE — Progress Notes (Deleted)
Subjective:    Patient ID: [name] Page is a 68 y.o. male.  HPI {Common ambulatory SmartLinks:[id number]}  Review of Systems  Constitutional: Negative.   HENT: Negative.   Eyes: Negative.   Respiratory: Negative.   Cardiovascular: Negative.   Gastrointestinal: Negative.   Endocrine: Negative.   Genitourinary: Negative.   Musculoskeletal: Positive for arthralgias.  Skin: Negative.   Allergic/Immunologic: Negative.   Neurological: Negative.   Hematological: Negative.   Psychiatric/Behavioral: Positive for sleep disturbance (e.d.s., snoring).    Objective:  Neurologic Exam  Physical Exam  Assessment:   ***  Plan:   ***

## 2014-04-04 ENCOUNTER — Encounter: Payer: Self-pay | Admitting: Neurology

## 2014-04-04 ENCOUNTER — Ambulatory Visit (INDEPENDENT_AMBULATORY_CARE_PROVIDER_SITE_OTHER): Payer: 59 | Admitting: Neurology

## 2014-04-04 VITALS — BP 117/75 | HR 76 | Temp 97.0°F | Ht 73.0 in | Wt 247.0 lb

## 2014-04-04 DIAGNOSIS — G4733 Obstructive sleep apnea (adult) (pediatric): Secondary | ICD-10-CM

## 2014-04-04 DIAGNOSIS — Z950 Presence of cardiac pacemaker: Secondary | ICD-10-CM

## 2014-04-04 DIAGNOSIS — R400 Somnolence: Secondary | ICD-10-CM

## 2014-04-04 DIAGNOSIS — E663 Overweight: Secondary | ICD-10-CM

## 2014-04-04 DIAGNOSIS — I4891 Unspecified atrial fibrillation: Secondary | ICD-10-CM

## 2014-04-04 DIAGNOSIS — G4710 Hypersomnia, unspecified: Secondary | ICD-10-CM

## 2014-04-04 DIAGNOSIS — I482 Chronic atrial fibrillation, unspecified: Secondary | ICD-10-CM

## 2014-04-04 NOTE — Patient Instructions (Signed)

## 2014-04-04 NOTE — Progress Notes (Signed)
Subjective:    Patient ID: [name] Page is a 68 y.o. male.  HPI    Star Age, MD, PhD [facility] [address] [address] [place], [name] [address]  Dear Dr. Reynaldo Minium,   I saw your patient, [name] Page, upon your kind request in my neurologic clinic today for initial consultation of his sleep disorder, in particular, concern for underlying obstructive sleep apnea. The patient is unaccompanied today. As you know, [name] is a very pleasant 68 year old right-handed gentleman with an underlying medical history of hyperlipidemia, arthritis, A. fib, status post pacemaker placement, obesity, allergic rhinitis, BPH, who reports snoring and daytime somnolence. His wife has witnessed apneic events. 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 He is able to come home every night. He has thankfully never fallen asleep driving. He states, he eats a lot of pumpkin and sunflower seeds while driving to keep him busy. His sister has OSA, but does not use CPAP. He drinks about 2 cups of coffee in the morning and 2 glasses of sweetened iced tea for supper.   His typical bedtime is reported to be around 10:30 PM and usual wake time is around 5:30 AM. Sleep onset typically occurs within minutes. He reports feeling marginally rested upon awakening. He wakes up on an average 4 times in the middle of the night and has to go to the bathroom 1 to 2 times on a typical night. He denies morning headaches.  He reports excessive daytime somnolence (EDS) and His Epworth Sleepiness Score (ESS) is 16/24 today. He has not fallen asleep while driving. The patient has not been taking a planned nap during the work week, but likes to take a nap on the weekend.   He has been known to snore for the past many years. Snoring is reportedly severe, and associated with choking sounds and witnessed apneas. The patient admits to a sense of choking or strangling feeling. There is no report of nighttime reflux,  with no nighttime cough experienced. The patient has not noted any RLS symptoms and is not known to kick while asleep or before falling asleep. He is not a particularly restless sleeper.   He denies cataplexy, sleep paralysis, hypnagogic or hypnopompic hallucinations, or sleep attacks. He does not report any vivid dreams, nightmares, dream enactments, or parasomnias, such as sleep talking or sleep walking. He had very vivid dreams on Zocor, which was changed to Crestor.  The patient has not had a sleep study or a home sleep test.  His bedroom is usually dark and cool. There is no TV in the bedroom.  He drinks alcohol occasionally. He quit smoking over 50 years ago.   His Past Medical History Is Significant For: Past Medical History  Diagnosis Date  . Chronic atrial fibrillation     s/p AV nodal ablation and on Coumadin. Patient developed dyspnea/fatigue with RV  pacing and an LV lead was placed  . Cardiomyopathy     Nonishemic, possibly tachycardia-mediated. 1997 LHC with no significant CAD. Myoview (7/09) showed EF 44% with possible mild inferior ischemia. Echo (9/10): EF 60%  . Upper GI bleed     With duodenal ulcer in 2005.  Patient was on ASA and coumadin at that time. ASA was stopped..  . Pseudogout   . Osteoarthritis of right shoulder region   . Hyperlipidemia   . HTN (hypertension)   . Hyperplastic colon polyp     and adenomatous  . Anal fistula   .  Pacemaker     St. Jude CRT-P device    His Past Surgical History Is Significant For: Past Surgical History  Procedure Laterality Date  . Pacemaker insertion      St. Jude's  . Treatment fistula anal    . Shoulder surgery      Right    His Family History Is Significant For: Family History  Problem Relation Age of Onset  . Heart disease Mother   . Colon cancer Neg Hx   . Stroke Father     His Social History Is Significant For: History   Social History  . Marital Status: Married    Spouse Name: N/A    Number of  Children: N/A  . Years of Education: N/A   Social History Main Topics  . Smoking status: Never Smoker   . Smokeless tobacco: Never Used  . Alcohol Use: No     Comment: Very rare  . Drug Use: No  . Sexual Activity: None   Other Topics Concern  . None   Social History Narrative   Lives in [place].   Does a lot of traveling.   Exercises daily.   Daily Caffeine use.    His Allergies Are:  No Known Allergies:   His Current Medications Are:  Outpatient Encounter Prescriptions as of 04/04/2014  Medication Sig  . carvedilol (COREG) 25 MG tablet Take 25 mg by mouth 2 (two) times daily.    . furosemide (LASIX) 40 MG tablet Take 40 mg by mouth as needed.    . hydrochlorothiazide 25 MG tablet Take 25 mg by mouth daily.    Marland Kitchen omeprazole (PRILOSEC) 20 MG capsule Take 20 mg by mouth 2 (two) times daily.    . potassium chloride SA (K-DUR,KLOR-CON) 20 MEQ tablet Take 20 mEq by mouth as needed.    . ramipril (ALTACE) 10 MG capsule Take 10 mg by mouth 2 (two) times daily.    . rosuvastatin (CRESTOR) 40 MG tablet Take 20 mg by mouth daily.    . traMADol (ULTRAM) 50 MG tablet Take 50 mg by mouth as needed for pain.  Marland Kitchen warfarin (COUMADIN) 5 MG tablet Take 5 mg by mouth daily.   :  Review of Systems:  Out of a complete 14 point review of systems, all are reviewed and negative with the exception of these symptoms as listed below:  Review of Systems  Constitutional: Negative.   HENT: Negative.   Eyes: Negative.   Respiratory: Negative.   Cardiovascular: Negative.   Gastrointestinal: Negative.   Endocrine: Negative.   Genitourinary: Negative.   Musculoskeletal: Positive for arthralgias.  Skin: Negative.   Allergic/Immunologic: Negative.   Neurological: Negative.   Hematological: Negative.   Psychiatric/Behavioral: Positive for sleep disturbance (snoring).    Objective:  Neurologic Exam  Physical Exam Physical Examination:   Filed Vitals:   04/04/14 0920  BP: 117/75  Pulse: 76   Temp: 97 F (36.1 C)    General Examination: The patient is a very pleasant 68 y.o. male in no acute distress. He appears well-developed and well-nourished and well groomed. He is overweight.   HEENT: Normocephalic, atraumatic, pupils are equal, round and reactive to light and accommodation. Funduscopic exam is normal with sharp disc margins noted. Extraocular tracking is good without limitation to gaze excursion or nystagmus noted. Normal smooth pursuit is noted. Hearing is grossly intact. Tympanic membranes are clear bilaterally. Face is symmetric with normal facial animation and normal facial sensation. Speech is clear with no dysarthria noted.  There is no hypophonia. There is no lip, neck/head, jaw or voice tremor. Neck is supple with full range of passive and active motion. There are no carotid bruits on auscultation. Oropharynx exam reveals: mild mouth dryness, adequate dental hygiene and moderate airway crowding, due to narrow airway entry and redundant soft palate and larger uvula and wider tongue base. Mallampati is class II. Tongue protrudes centrally and palate elevates symmetrically. Tonsils are absent. Neck size is 18.5 inches. He has a absent overbite. Nasal inspection reveals no significant nasal mucosal bogginess or redness and no septal deviation.   Chest: Clear to auscultation without wheezing, rhonchi or crackles noted.  Heart: S1+S2+0, regular and normal without murmurs, rubs or gallops noted.   Abdomen: Soft, non-tender and non-distended with normal bowel sounds appreciated on auscultation.  Extremities: There is trace pitting edema in the distal lower extremities bilaterally. Pedal pulses are intact.  Skin: Warm and dry without trophic changes noted. There are no varicose veins.  Musculoskeletal: exam reveals no obvious joint deformities, tenderness or joint swelling or erythema.   Neurologically:  Mental status: The patient is awake, alert and oriented in all 4 spheres.  His immediate and remote memory, attention, language skills and fund of knowledge are appropriate. There is no evidence of aphasia, agnosia, apraxia or anomia. Speech is clear with normal prosody and enunciation. Thought process is linear. Mood is normal and affect is normal.  Cranial nerves II - XII are as described above under HEENT exam. In addition: shoulder shrug is normal with equal shoulder height noted. Motor exam: Normal bulk, strength and tone is noted. There is no drift, tremor or rebound. Romberg is negative. Reflexes are 2+ throughout. Babinski: Toes are flexor bilaterally. Fine motor skills and coordination: intact with normal finger taps, normal hand movements, normal rapid alternating patting, normal foot taps and normal foot agility.  Cerebellar testing: No dysmetria or intention tremor on finger to nose testing. Heel to shin is unremarkable bilaterally. There is no truncal or gait ataxia.  Sensory exam: intact to light touch, pinprick, vibration, temperature sense in the upper and lower extremities.  Gait, station and balance: He stands easily. No veering to one side is noted. No leaning to one side is noted. Posture is age-appropriate and stance is narrow based. Gait shows normal stride length and normal pace. No problems turning are noted. He turns en bloc. Tandem walk is unremarkable. Intact toe and heel stance is noted.               Assessment and Plan:   In summary, SHANE BADEAUX is a very pleasant 68 y.o.-year old male with a history and physical exam concerning for obstructive sleep apnea (OSA). He describes loud snoring, has been witnessed to have pauses in his breathing and even has woken up with a sense of gasping for air. He has cardiovascular risk factors, and has been on treatment for A. fib. He is overweight and is working on weight loss. He has an elliptical machine that he uses. He also likes to play golf he states. He is quite sleepy during the day and has been advised  to not drive when sleepy. He has to pull over and rest when he feels that he is starting to feel sleepy while driving. He understands this very well. I had a long chat with the patient about my findings and the diagnosis of OSA, its prognosis and treatment options. We talked about medical treatments, surgical interventions and non-pharmacological approaches. I explained  in particular the risks and ramifications of untreated moderate to severe OSA, especially with respect to developing cardiovascular disease down the Road, including congestive heart failure, difficult to treat hypertension, cardiac arrhythmias, or stroke. Even type 2 diabetes has, in part, been linked to untreated OSA. Symptoms of untreated OSA include daytime sleepiness, memory problems, mood irritability and mood disorder such as depression and anxiety, lack of energy, as well as recurrent headaches, especially morning headaches. We talked about trying to maintain a healthy lifestyle in general, as well as the importance of weight control. I encouraged the patient to eat healthy, exercise daily and keep well hydrated, to keep a scheduled bedtime and wake time routine, to not skip any meals and eat healthy snacks in between meals. I advised the patient not to drive when feeling sleepy. I recommended the following at this time: sleep study with potential positive airway pressure titration.  I explained the sleep test procedure to the patient and also outlined possible surgical and non-surgical treatment options of OSA, including the use of a custom-made dental device (which would require a referral to a specialist dentist or oral surgeon), upper airway surgical options, such as pillar implants, radiofrequency surgery, tongue base surgery, and UPPP (which would involve a referral to an ENT surgeon). Rarely, jaw surgery such as mandibular advancement may be considered.  I also explained the CPAP treatment option to the patient, who indicated that  he would be willing to try CPAP if the need arises. I explained the importance of being compliant with PAP treatment, not only for insurance purposes but primarily to improve His symptoms, and for the patient's long term health benefit, including to reduce His cardiovascular risks. I answered all his questions today and the patient was in agreement. I would like to see him back after the sleep study is completed and encouraged him to call with any interim questions, concerns, problems or updates.   Thank you very much for allowing me to participate in the care of this nice patient. If I can be of any further assistance to you please do not hesitate to call me at [phone].  Sincerely,   Star Age, MD, PhD

## 2014-04-12 NOTE — Progress Notes (Signed)
Not an actual encounter.

## 2014-04-19 ENCOUNTER — Ambulatory Visit (INDEPENDENT_AMBULATORY_CARE_PROVIDER_SITE_OTHER): Payer: 59 | Admitting: Neurology

## 2014-04-19 DIAGNOSIS — G4733 Obstructive sleep apnea (adult) (pediatric): Secondary | ICD-10-CM

## 2014-04-19 DIAGNOSIS — Z950 Presence of cardiac pacemaker: Secondary | ICD-10-CM

## 2014-04-19 DIAGNOSIS — G4761 Periodic limb movement disorder: Secondary | ICD-10-CM

## 2014-04-19 DIAGNOSIS — G4710 Hypersomnia, unspecified: Secondary | ICD-10-CM

## 2014-04-19 DIAGNOSIS — E663 Overweight: Secondary | ICD-10-CM

## 2014-04-19 DIAGNOSIS — I482 Chronic atrial fibrillation, unspecified: Secondary | ICD-10-CM

## 2014-05-03 ENCOUNTER — Telehealth: Payer: Self-pay | Admitting: Neurology

## 2014-05-03 ENCOUNTER — Telehealth: Payer: Self-pay | Admitting: *Deleted

## 2014-05-03 DIAGNOSIS — I4891 Unspecified atrial fibrillation: Secondary | ICD-10-CM

## 2014-05-03 DIAGNOSIS — G4733 Obstructive sleep apnea (adult) (pediatric): Secondary | ICD-10-CM

## 2014-05-03 NOTE — Telephone Encounter (Signed)
Please call and notify patient that the recent sleep study confirmed the diagnosis of OSA. He did very well with CPAP during the study with significant improvement of the respiratory events. Therefore, I would like start the patient on CPAP at home. I placed the order in the chart.   Arrange for CPAP set up at home through a DME company of patient's choice and fax/route report to PCP and referring MD (if other than PCP).   The patient will also need a follow up appointment with me in 6-8 weeks post set up that has to be scheduled; help the patient schedule this (in a follow-up slot).   Please re-enforce the importance of compliance with treatment and the need for us to monitor compliance data.   Once you have spoken to the patient and scheduled the return appointment, you may close this encounter, thanks,   Gemini Bunte, MD, PhD [facility]    

## 2014-05-03 NOTE — Telephone Encounter (Signed)
Pt calling requesting sleep study results. Please advise

## 2014-05-06 ENCOUNTER — Encounter: Payer: Self-pay | Admitting: Neurology

## 2014-05-06 NOTE — Telephone Encounter (Signed)
Sleep study results were provided to the patient.  He understands the dx of osa and that an order has been submitted to begin CPAP therapy.  Pt is aware that a copy of this report will be sent to the referring provider and mailed to the pt's home address.

## 2014-06-06 ENCOUNTER — Encounter: Payer: Self-pay | Admitting: Internal Medicine

## 2014-06-06 ENCOUNTER — Ambulatory Visit (INDEPENDENT_AMBULATORY_CARE_PROVIDER_SITE_OTHER): Payer: 59 | Admitting: Internal Medicine

## 2014-06-06 VITALS — BP 137/81 | HR 70 | Ht 74.0 in | Wt 252.0 lb

## 2014-06-06 DIAGNOSIS — Z950 Presence of cardiac pacemaker: Secondary | ICD-10-CM

## 2014-06-06 DIAGNOSIS — I442 Atrioventricular block, complete: Secondary | ICD-10-CM

## 2014-06-06 DIAGNOSIS — I5022 Chronic systolic (congestive) heart failure: Secondary | ICD-10-CM

## 2014-06-06 DIAGNOSIS — I4891 Unspecified atrial fibrillation: Secondary | ICD-10-CM

## 2014-06-06 LAB — MDC_IDC_ENUM_SESS_TYPE_INCLINIC
Battery Remaining Longevity: 63.6 mo
Battery Voltage: 2.92 V
Brady Statistic RA Percent Paced: 0 %
Implantable Pulse Generator Serial Number: 2227415
Lead Channel Pacing Threshold Amplitude: 0.875 V
Lead Channel Pacing Threshold Pulse Width: 0.5 ms
Lead Channel Pacing Threshold Pulse Width: 0.8 ms
Lead Channel Setting Pacing Amplitude: 2 V
Lead Channel Setting Pacing Amplitude: 2 V
Lead Channel Setting Pacing Pulse Width: 0.5 ms
Lead Channel Setting Pacing Pulse Width: 0.8 ms
MDC IDC MSMT LEADCHNL LV IMPEDANCE VALUE: 487.5 Ohm
MDC IDC MSMT LEADCHNL LV PACING THRESHOLD AMPLITUDE: 0.625 V
MDC IDC MSMT LEADCHNL RV IMPEDANCE VALUE: 412.5 Ohm
MDC IDC SESS DTM: 20150825092924
MDC IDC SET LEADCHNL RV SENSING SENSITIVITY: 4 mV
MDC IDC STAT BRADY RV PERCENT PACED: 99.76 %

## 2014-06-06 NOTE — Progress Notes (Signed)
HPI [name] returns today for followup. He is a pleasant 68 yo man with a h/o HTN, chronic atrial fibrillation, s/p AV node ablation, and PPM. He continues to work full-time. He denies chest pain, shortness of breath, syncope, or peripheral edema. He is scheduled to retire in a few months. No Known Allergies   Current Outpatient Prescriptions  Medication Sig Dispense Refill  . carvedilol (COREG) 25 MG tablet Take 25 mg by mouth 2 (two) times daily.        . furosemide (LASIX) 40 MG tablet Take 40 mg by mouth as needed.        . hydrochlorothiazide 25 MG tablet Take 25 mg by mouth daily.        Marland Kitchen omeprazole (PRILOSEC) 20 MG capsule Take 20 mg by mouth 2 (two) times daily.        . potassium chloride SA (K-DUR,KLOR-CON) 20 MEQ tablet Take 20 mEq by mouth as needed.        . ramipril (ALTACE) 10 MG capsule Take 10 mg by mouth 2 (two) times daily.        . rosuvastatin (CRESTOR) 40 MG tablet Take 20 mg by mouth daily.        . traMADol (ULTRAM) 50 MG tablet Take 50 mg by mouth as needed for pain.      Marland Kitchen warfarin (COUMADIN) 5 MG tablet Take 5 mg by mouth daily.        No current facility-administered medications for this visit.     Past Medical History  Diagnosis Date  . Chronic atrial fibrillation     s/p AV nodal ablation and on Coumadin. Patient developed dyspnea/fatigue with RV  pacing and an LV lead was placed  . Cardiomyopathy     Nonishemic, possibly tachycardia-mediated. 1997 LHC with no significant CAD. Myoview (7/09) showed EF 44% with possible mild inferior ischemia. Echo (9/10): EF 60%  . Upper GI bleed     With duodenal ulcer in 2005.  Patient was on ASA and coumadin at that time. ASA was stopped..  . Pseudogout   . Osteoarthritis of right shoulder region   . Hyperlipidemia   . HTN (hypertension)   . Hyperplastic colon polyp     and adenomatous  . Anal fistula   . Pacemaker     St. Jude CRT-P device    ROS:   All systems reviewed and negative except as noted in  the HPI.   Past Surgical History  Procedure Laterality Date  . Pacemaker insertion      St. Jude's  . Treatment fistula anal    . Shoulder surgery      Right     Family History  Problem Relation Age of Onset  . Heart disease Mother   . Colon cancer Neg Hx   . Stroke Father      History   Social History  . Marital Status: Married    Spouse Name: N/A    Number of Children: N/A  . Years of Education: N/A   Occupational History  . Not on file.   Social History Main Topics  . Smoking status: Never Smoker   . Smokeless tobacco: Never Used  . Alcohol Use: No     Comment: Very rare  . Drug Use: No  . Sexual Activity: Not on file   Other Topics Concern  . Not on file   Social History Narrative   Lives in [place].   Does a lot of traveling.  Exercises daily.   Daily Caffeine use.     BP 137/81  Pulse 70  Ht 6\' 2"  (1.88 m)  Wt 252 lb (114.306 kg)  BMI 32.34 kg/m2  Physical Exam:  Well appearing middle-aged man, NAD HEENT: Unremarkable Neck:  7 cm JVD, no thyromegally Back:  No CVA tenderness Lungs:  Clear with no wheezes, rales, or rhonchi. Well-healed pacemaker incision. HEART:  Regular rate rhythm, no murmurs, no rubs, no clicks Abd:  soft, positive bowel sounds, no organomegally, no rebound, no guarding Ext:  2 plus pulses, no edema, no cyanosis, no clubbing Skin:  No rashes no nodules Neuro:  CN II through XII intact, motor grossly intact  EKG -atrial fibrillation with biventricular pacing  DEVICE  Normal device function.  See PaceArt for details.   Assess/Plan:

## 2014-06-06 NOTE — Assessment & Plan Note (Signed)
His symptoms are now class I. He will continue his current medical therapy. I've encouraged the patient to reduce his sodium intake.

## 2014-06-06 NOTE — Assessment & Plan Note (Signed)
His St. Jude biventricular pacemaker is working normally. We'll plan to recheck in several months. 

## 2014-06-06 NOTE — Patient Instructions (Addendum)

## 2014-06-13 ENCOUNTER — Encounter: Payer: Self-pay | Admitting: Internal Medicine

## 2014-06-28 ENCOUNTER — Encounter: Payer: Self-pay | Admitting: Neurology

## 2014-06-28 NOTE — Progress Notes (Signed)
Quick Note:  I reviewed the patient's CPAP compliance data from 05/16/2014 to 06/14/2014, which is a total of 30 days, during which time the patient used CPAP every day. The average usage for all days was 7 hours and 23 minutes. The percent used days greater than 4 hours was 100 %, indicating superb compliance. The residual AHI was 0.6 per hour, indicating an appropriate treatment pressure of 9 cwp with EPR of 2. Air leak from the mask was very low at 3 L per minute at the 95th percentile. I will review this data with the patient at the next office visit, which is currently routinely scheduled for 06/29/2014 at 10 AM, provide feedback and additional troubleshooting if need be.  Star Age, MD, PhD [facility]   ______

## 2014-06-29 ENCOUNTER — Ambulatory Visit (INDEPENDENT_AMBULATORY_CARE_PROVIDER_SITE_OTHER): Payer: 59 | Admitting: Neurology

## 2014-06-29 ENCOUNTER — Encounter: Payer: Self-pay | Admitting: Neurology

## 2014-06-29 VITALS — BP 106/68 | HR 70 | Temp 98.2°F | Ht 74.0 in | Wt 253.0 lb

## 2014-06-29 DIAGNOSIS — Z9989 Dependence on other enabling machines and devices: Principal | ICD-10-CM

## 2014-06-29 DIAGNOSIS — G4733 Obstructive sleep apnea (adult) (pediatric): Secondary | ICD-10-CM

## 2014-06-29 DIAGNOSIS — I4891 Unspecified atrial fibrillation: Secondary | ICD-10-CM

## 2014-06-29 DIAGNOSIS — Z950 Presence of cardiac pacemaker: Secondary | ICD-10-CM

## 2014-06-29 DIAGNOSIS — E663 Overweight: Secondary | ICD-10-CM

## 2014-06-29 NOTE — Progress Notes (Signed)
Subjective:    Patient ID: [name] Page is a 68 y.o. male.  HPI    Interim history:  [name] Page is a very pleasant 68-year-old right-handed gentleman with an underlying medical history of hyperlipidemia, arthritis, A. fib, status post pacemaker placement, obesity, allergic rhinitis, BPH, who presents for followup consultation of his obstructive sleep apnea. The patient is unaccompanied today. I first met him on 04/04/2014 at the request of his primary care physician, at which time the patient reported snoring, significant daytime somnolence and witnessed apneas per wife. I invited him back for sleep study. He had a split-night sleep study on 04/19/2014, and I went over his test results with him in detail today. Baseline sleep efficiency was significantly reduced at 53.9% with a long latency to sleep of 39.5 minutes and wake after sleep onset of 67.5 minutes with moderate sleep fragmentation noted. He had an elevated arousal index. He had an increased percentage of stage I and stage II sleep as well as absence of slow-wave and REM sleep. He had no significant periodic leg movements and EKG showed known A. fib. He had a paced cardiac rhythm. His total AHI was elevated at 25 per hour. Baseline oxygen saturation was 93%, nadir was 73%. He was then titrated on CPAP. Sleep efficiency was 67%. He had a absence of deep sleep but increased percentage of dream sleep at 47.1% after CPAP. His average oxygen saturation was 93% with a nadir of 89%. He was titrated from 5-9 cm with a reduction of his AHI to 0 events per hour at the final pressure with supine REM sleep achieved. He had severe periodic leg movements during the treatment portion of the study but no significant arousals. Based on the test results I prescribed CPAP for him. I reviewed the patient's CPAP compliance data from 05/16/2014 to 06/14/2014, which is a total of 30 days, during which time the patient used CPAP every day. The average usage for all  days was 7 hours and 23 minutes. The percent used days greater than 4 hours was 100 %, indicating superb compliance. The residual AHI was 0.6 per hour, indicating an appropriate treatment pressure of 9 cwp with EPR of 2. Air leak from the mask was very low at 3 L per minute at the 95th percentile.  Today, I reviewed his compliance data from 05/29/2014 through 06/27/2014 which is a total of 30 days during which time he used his machine every night with percent used days greater than 4 hours of 100%, again indicating flawless compliance. His average usage was 7 hours and 25 minutes. His residual AHI low at 0.3 with leak very low at 0.8 at the current pressure of 9 cm with EPR of 2. Today, he reports doing much better. He feels that he is sleeping better, his wife is pleased with how he is sleeping, his daytime somnolence and sleep consolidation are improved. He denies any restless leg symptoms in his not bothers by any leg movements or twitching at night. Overall he has done really well and is very pleased. He is completely compliance with treatment and has had no issues adjusting to the machine and the mask. He uses nasal pillows and likes them.   His Past Medical History Is Significant For: Past Medical History  Diagnosis Date  . Chronic atrial fibrillation     s/p AV nodal ablation and on Coumadin. Patient developed dyspnea/fatigue with RV  pacing and an LV lead was placed  . Cardiomyopathy       Nonishemic, possibly tachycardia-mediated. 1997 LHC with no significant CAD. Myoview (7/09) showed EF 44% with possible mild inferior ischemia. Echo (9/10): EF 60%  . Upper GI bleed     With duodenal ulcer in 2005.  Patient was on ASA and coumadin at that time. ASA was stopped..  . Pseudogout   . Osteoarthritis of right shoulder region   . Hyperlipidemia   . HTN (hypertension)   . Hyperplastic colon polyp     and adenomatous  . Anal fistula   . Pacemaker     St. Jude CRT-P device  . Atrial  fibrillation     His Past Surgical History Is Significant For: Past Surgical History  Procedure Laterality Date  . Pacemaker insertion      St. Jude's  . Treatment fistula anal    . Shoulder surgery      Right    His Family History Is Significant For: Family History  Problem Relation Age of Onset  . Heart disease Mother   . Colon cancer Neg Hx   . Stroke Father     His Social History Is Significant For: History   Social History  . Marital Status: Married    Spouse Name: [name] Page    Number of Children: 0  . Years of Education: some coll.   Occupational History  . SALES    Social History Main Topics  . Smoking status: Never Smoker   . Smokeless tobacco: Never Used  . Alcohol Use: No     Comment: Very rare  . Drug Use: No  . Sexual Activity: None   Other Topics Concern  . None   Social History Narrative   Lives in [place].   Does a lot of traveling.   Exercises daily.   Daily Caffeine use.   Patient is right handed    His Allergies Are:  No Known Allergies:   His Current Medications Are:  Outpatient Encounter Prescriptions as of 06/29/2014  Medication Sig  . amoxicillin (AMOXIL) 500 MG capsule As needed when going to dentist,take 4 capsules  . carvedilol (COREG) 25 MG tablet Take 25 mg by mouth 2 (two) times daily.    . furosemide (LASIX) 40 MG tablet Take 40 mg by mouth as needed.    . hydrochlorothiazide 25 MG tablet Take 25 mg by mouth daily.    . omeprazole (PRILOSEC) 20 MG capsule Take 20 mg by mouth 2 (two) times daily.    . potassium chloride SA (K-DUR,KLOR-CON) 20 MEQ tablet Take 20 mEq by mouth as needed.    . ramipril (ALTACE) 10 MG capsule Take 10 mg by mouth 2 (two) times daily.    . rosuvastatin (CRESTOR) 40 MG tablet Take 20 mg by mouth daily.    . traMADol (ULTRAM) 50 MG tablet Take 50 mg by mouth as needed for pain.  . warfarin (COUMADIN) 5 MG tablet Take 5 mg by mouth daily.   :  Review of Systems:  Out of a complete 14 point review  of systems, all are reviewed and negative with the exception of these symptoms as listed below:   Review of Systems  All other systems reviewed and are negative.   Objective:  Neurologic Exam  Physical Exam Physical Examination:   Filed Vitals:   06/29/14 1022  BP: 106/68  Pulse: 70  Temp: 98.2 F (36.8 C)    General Examination: The patient is a very pleasant 68 y.o. male in no acute distress. He appears well-developed and well-nourished and well   groomed. He is overweight.   HEENT: Normocephalic, atraumatic, pupils are equal, round and reactive to light and accommodation. Funduscopic exam is normal with sharp disc margins noted. Extraocular tracking is good without limitation to gaze excursion or nystagmus noted. Normal smooth pursuit is noted. Hearing is grossly intact. Face is symmetric with normal facial animation and normal facial sensation. Speech is clear with no dysarthria noted. There is no hypophonia. There is no lip, neck/head, jaw or voice tremor. Neck is supple with full range of passive and active motion. There are no carotid bruits on auscultation. Oropharynx exam reveals: mild mouth dryness, adequate dental hygiene and moderate airway crowding, due to narrow airway entry and redundant soft palate and larger uvula and wider tongue base. Mallampati is class II. Tongue protrudes centrally and palate elevates symmetrically. Tonsils are absent. He has a absent overbite.    Chest: Clear to auscultation without wheezing, rhonchi or crackles noted.  Heart: S1+S2+0, regular and normal without murmurs, rubs or gallops noted.   Abdomen: Soft, non-tender and non-distended with normal bowel sounds appreciated on auscultation.  Extremities: There is trace pitting edema in the distal lower extremities bilaterally. Pedal pulses are intact.  Skin: Warm and dry without trophic changes noted. There are no varicose veins.  Musculoskeletal: exam reveals no obvious joint deformities,  tenderness or joint swelling or erythema.   Neurologically:  Mental status: The patient is awake, alert and oriented in all 4 spheres. His immediate and remote memory, attention, language skills and fund of knowledge are appropriate. There is no evidence of aphasia, agnosia, apraxia or anomia. Speech is clear with normal prosody and enunciation. Thought process is linear. Mood is normal and affect is normal.  Cranial nerves II - XII are as described above under HEENT exam. In addition: shoulder shrug is normal with equal shoulder height noted. Motor exam: Normal bulk, strength and tone is noted. There is no drift, tremor or rebound. Romberg is negative. Reflexes are 2+ throughout. Babinski: Toes are flexor bilaterally. Fine motor skills and coordination: intact with normal finger taps, normal hand movements, normal rapid alternating patting, normal foot taps and normal foot agility.  Cerebellar testing: No dysmetria or intention tremor on finger to nose testing. There is no truncal or gait ataxia.  Sensory exam: intact to light touch, pinprick, vibration, temperature sense in the upper and lower extremities.  Gait, station and balance: He stands easily. No veering to one side is noted. No leaning to one side is noted. Posture is age-appropriate and stance is narrow based. Gait shows normal stride length and normal pace. No problems turning are noted. He turns en bloc. Tandem walk is unremarkable.   Assessment and Plan:   In summary, [name] Page is a very pleasant 68-year old male with an underlying medical history of hyperlipidemia, arthritis, A. fib, status post pacemaker placement, obesity, allergic rhinitis, BPH, who presents for followup consultation of his obstructive sleep apnea. His recent split-night sleep study confirmed moderate to severe sleep apnea. Of note, the absence of REM sleep during the baseline portion of the test will likely have underestimated the degree of his sleep apnea and  degree of his desaturation nadir. He has been on CPAP treatment with full compliance from the beginning. He has done really well and endorses significant improvement of his sleep related issues including sleep consolidation, better quality of sleep and reduction in daytime somnolence is reported as well. He is overall very pleased. Today we discussed sleep study results in detail and   I also explained to him his compliance data in detail and shared his hypogram data with him as well as provided him with a copy of it. He is commended on being fully compliant with CPAP and encouraged to continue using it every night. Since he is doing well, I would like to see him back in 6 months, and yearly thereafter if he continues to do well. I answered all his questions today and he was in agreement. 

## 2014-06-29 NOTE — Patient Instructions (Signed)

## 2014-09-11 ENCOUNTER — Encounter: Payer: Self-pay | Admitting: Internal Medicine

## 2014-09-11 ENCOUNTER — Ambulatory Visit (INDEPENDENT_AMBULATORY_CARE_PROVIDER_SITE_OTHER): Payer: Commercial Managed Care - HMO | Admitting: *Deleted

## 2014-09-11 DIAGNOSIS — I442 Atrioventricular block, complete: Secondary | ICD-10-CM

## 2014-09-11 LAB — MDC_IDC_ENUM_SESS_TYPE_REMOTE
Battery Remaining Longevity: 45 mo
Brady Statistic RV Percent Paced: 100 %
Implantable Pulse Generator Model: 3210
Lead Channel Impedance Value: 390 Ohm
Lead Channel Pacing Threshold Amplitude: 0.5 V
Lead Channel Pacing Threshold Amplitude: 0.75 V
Lead Channel Sensing Intrinsic Amplitude: 5.4 mV
Lead Channel Setting Pacing Amplitude: 2 V
Lead Channel Setting Pacing Amplitude: 2 V
Lead Channel Setting Pacing Pulse Width: 0.5 ms
Lead Channel Setting Pacing Pulse Width: 0.8 ms
MDC IDC MSMT BATTERY REMAINING PERCENTAGE: 48 %
MDC IDC MSMT BATTERY VOLTAGE: 2.89 V
MDC IDC MSMT LEADCHNL LV IMPEDANCE VALUE: 460 Ohm
MDC IDC MSMT LEADCHNL LV PACING THRESHOLD PULSEWIDTH: 0.5 ms
MDC IDC MSMT LEADCHNL RV PACING THRESHOLD PULSEWIDTH: 0.8 ms
MDC IDC PG SERIAL: 2227415
MDC IDC SESS DTM: 20151130072318
MDC IDC SET LEADCHNL RV SENSING SENSITIVITY: 4 mV

## 2014-09-12 NOTE — Progress Notes (Signed)
Remote pacemaker transmission.   

## 2014-09-27 ENCOUNTER — Encounter: Payer: Self-pay | Admitting: Cardiology

## 2014-10-24 DIAGNOSIS — I4891 Unspecified atrial fibrillation: Secondary | ICD-10-CM | POA: Diagnosis not present

## 2014-10-24 DIAGNOSIS — Z7901 Long term (current) use of anticoagulants: Secondary | ICD-10-CM | POA: Diagnosis not present

## 2014-11-01 DIAGNOSIS — G4733 Obstructive sleep apnea (adult) (pediatric): Secondary | ICD-10-CM | POA: Diagnosis not present

## 2014-11-03 DIAGNOSIS — G4733 Obstructive sleep apnea (adult) (pediatric): Secondary | ICD-10-CM | POA: Diagnosis not present

## 2014-11-08 DIAGNOSIS — H3531 Nonexudative age-related macular degeneration: Secondary | ICD-10-CM | POA: Diagnosis not present

## 2014-11-08 DIAGNOSIS — H25013 Cortical age-related cataract, bilateral: Secondary | ICD-10-CM | POA: Diagnosis not present

## 2014-11-08 DIAGNOSIS — H35033 Hypertensive retinopathy, bilateral: Secondary | ICD-10-CM | POA: Diagnosis not present

## 2014-11-08 DIAGNOSIS — H2513 Age-related nuclear cataract, bilateral: Secondary | ICD-10-CM | POA: Diagnosis not present

## 2014-11-29 DIAGNOSIS — Z7901 Long term (current) use of anticoagulants: Secondary | ICD-10-CM | POA: Diagnosis not present

## 2014-11-29 DIAGNOSIS — I4891 Unspecified atrial fibrillation: Secondary | ICD-10-CM | POA: Diagnosis not present

## 2014-12-02 DIAGNOSIS — G4733 Obstructive sleep apnea (adult) (pediatric): Secondary | ICD-10-CM | POA: Diagnosis not present

## 2014-12-11 ENCOUNTER — Ambulatory Visit (INDEPENDENT_AMBULATORY_CARE_PROVIDER_SITE_OTHER): Payer: Commercial Managed Care - HMO | Admitting: *Deleted

## 2014-12-11 DIAGNOSIS — I442 Atrioventricular block, complete: Secondary | ICD-10-CM

## 2014-12-11 NOTE — Progress Notes (Signed)
Remote pacemaker transmission.   

## 2014-12-14 ENCOUNTER — Telehealth: Payer: Self-pay | Admitting: Neurology

## 2014-12-14 NOTE — Telephone Encounter (Signed)
Patient has rescheduled and confirmed new appointment time on 01/02/15

## 2014-12-21 LAB — MDC_IDC_ENUM_SESS_TYPE_REMOTE
Brady Statistic RV Percent Paced: 99 %
Lead Channel Impedance Value: 380 Ohm
Lead Channel Impedance Value: 490 Ohm
Lead Channel Pacing Threshold Amplitude: 0.75 V
Lead Channel Pacing Threshold Pulse Width: 0.5 ms
Lead Channel Pacing Threshold Pulse Width: 0.8 ms
Lead Channel Setting Pacing Amplitude: 2 V
Lead Channel Setting Pacing Amplitude: 2 V
Lead Channel Setting Sensing Sensitivity: 4 mV
MDC IDC MSMT BATTERY REMAINING LONGEVITY: 40 mo
MDC IDC MSMT BATTERY REMAINING PERCENTAGE: 43 %
MDC IDC MSMT BATTERY VOLTAGE: 2.87 V
MDC IDC MSMT LEADCHNL RV PACING THRESHOLD AMPLITUDE: 0.875 V
MDC IDC MSMT LEADCHNL RV SENSING INTR AMPL: 5.4 mV
MDC IDC PG SERIAL: 2227415
MDC IDC SESS DTM: 20160229075211
MDC IDC SET LEADCHNL LV PACING PULSEWIDTH: 0.5 ms
MDC IDC SET LEADCHNL RV PACING PULSEWIDTH: 0.8 ms

## 2014-12-28 ENCOUNTER — Encounter: Payer: Self-pay | Admitting: Cardiology

## 2014-12-31 DIAGNOSIS — G4733 Obstructive sleep apnea (adult) (pediatric): Secondary | ICD-10-CM | POA: Diagnosis not present

## 2015-01-02 ENCOUNTER — Ambulatory Visit (INDEPENDENT_AMBULATORY_CARE_PROVIDER_SITE_OTHER): Payer: Commercial Managed Care - HMO | Admitting: Neurology

## 2015-01-02 ENCOUNTER — Encounter: Payer: Self-pay | Admitting: Neurology

## 2015-01-02 ENCOUNTER — Telehealth: Payer: Self-pay | Admitting: Neurology

## 2015-01-02 VITALS — BP 120/75 | HR 95 | Temp 97.6°F | Resp 18 | Ht 74.0 in | Wt 246.0 lb

## 2015-01-02 DIAGNOSIS — G4733 Obstructive sleep apnea (adult) (pediatric): Secondary | ICD-10-CM | POA: Diagnosis not present

## 2015-01-02 DIAGNOSIS — Z9989 Dependence on other enabling machines and devices: Principal | ICD-10-CM

## 2015-01-02 DIAGNOSIS — I4891 Unspecified atrial fibrillation: Secondary | ICD-10-CM

## 2015-01-02 DIAGNOSIS — Z950 Presence of cardiac pacemaker: Secondary | ICD-10-CM

## 2015-01-02 DIAGNOSIS — Z7901 Long term (current) use of anticoagulants: Secondary | ICD-10-CM | POA: Diagnosis not present

## 2015-01-02 NOTE — Telephone Encounter (Signed)
Please call [facility] in [facility]): Pt was switched from [facility] to [facility] in 11/15, and seems like his pressure was changed from 9 cm to auto for some reason and I do not recall changing his pressure settings? He was not aware of the change either, he indicates ongoing good compliance and the data is suggesting less usage too. I am perplexed, he has always been 100% compliant. He needs to go back on 9 cm. What happened here?

## 2015-01-02 NOTE — Progress Notes (Signed)
Subjective:    Patient ID: [name] Page is a 69 y.o. male.  HPI     Interim history:   [name] Page is a very pleasant 69 year old right-handed gentleman with an underlying medical history of hyperlipidemia, arthritis, A. fib, status post pacemaker placement, obesity, allergic rhinitis, BPH, who presents for followup consultation of his obstructive sleep apnea. The patient is unaccompanied today. I last saw him on 06/29/2014, at which time he reported doing much better. He felt, he was sleeping better, his wife was pleased with how he was sleeping, his daytime somnolence and sleep consolidation were improved. He denied any restless leg symptoms and was not bothered by any leg movements or twitching at night. Overall he had done really well and was very pleased. He was completely compliant with treatment and had no issues adjusting to the machine and the mask. He was using nasal pillows and liked them.  Today, 01/02/2015: I reviewed his CPAP compliance data from 12/02/2014 through 12/31/2014 which is a total of 30 days during which time he used his machine 28 days with percent used days greater than 4 hours of only 50%, indicating suboptimal compliance with an average usage of 3 hours and 59 minutes. 95th percentile of pressure was 10.2. Leak acceptable with the 95th percentile of leak at 12.9 L/m. Residual AHI at 3.8 per hour.   Today, 01/02/2015: he reports that he continues to be fully compliant with treatment. He averages way more than 4 hours a night. He's perplexed just as I am regarding his CPAP compliance. Of note, he change DME providers per insurance requirement in November 2015. [facility]. He sleeps on average 7 hours each night. He retired in October 2015 and has been playing more golf. He uses his elliptical daily for about 30 minutes.  Previously:  I first met him on 04/04/2014 at the request of his primary care physician, at which time the patient  reported snoring, significant daytime somnolence and witnessed apneas per wife. I invited him back for sleep study. He had a split-night sleep study on 04/19/2014, and I went over his test results with him in detail today. Baseline sleep efficiency was significantly reduced at 53.9% with a long latency to sleep of 39.5 minutes and wake after sleep onset of 67.5 minutes with moderate sleep fragmentation noted. He had an elevated arousal index. He had an increased percentage of stage I and stage II sleep as well as absence of slow-wave and REM sleep. He had no significant periodic leg movements and EKG showed known A. fib. He had a paced cardiac rhythm. His total AHI was elevated at 25 per hour. Baseline oxygen saturation was 93%, nadir was 73%. He was then titrated on CPAP. Sleep efficiency was 67%. He had a absence of deep sleep but increased percentage of dream sleep at 47.1% after CPAP. His average oxygen saturation was 93% with a nadir of 89%. He was titrated from 5-9 cm with a reduction of his AHI to 0 events per hour at the final pressure with supine REM sleep achieved. He had severe periodic leg movements during the treatment portion of the study but no significant arousals. Based on the test results I prescribed CPAP for him.  I reviewed the patient's CPAP compliance data from 05/16/2014 to 06/14/2014, which is a total of 30 days, during which time the patient used CPAP every day. The average usage for all days was 7 hours and 23 minutes. The percent  used days greater than 4 hours was 100 %, indicating superb compliance. The residual AHI was 0.6 per hour, indicating an appropriate treatment pressure of 9 cwp with EPR of 2. Air leak from the mask was very low at 3 L per minute at the 95th percentile.  I reviewed his compliance data from 05/29/2014 through 06/27/2014 which is a total of 30 days during which time he used his machine every night with percent used days greater than 4 hours of 100%, again  indicating flawless compliance. His average usage was 7 hours and 25 minutes. His residual AHI low at 0.3 with leak very low at 0.8 at the current pressure of 9 cm with EPR of 2.  His Past Medical History Is Significant For: Past Medical History  Diagnosis Date  . Chronic atrial fibrillation     s/p AV nodal ablation and on Coumadin. Patient developed dyspnea/fatigue with RV  pacing and an LV lead was placed  . Cardiomyopathy     Nonishemic, possibly tachycardia-mediated. 1997 LHC with no significant CAD. Myoview (7/09) showed EF 44% with possible mild inferior ischemia. Echo (9/10): EF 60%  . Upper GI bleed     With duodenal ulcer in 2005.  Patient was on ASA and coumadin at that time. ASA was stopped..  . Pseudogout   . Osteoarthritis of right shoulder region   . Hyperlipidemia   . HTN (hypertension)   . Hyperplastic colon polyp     and adenomatous  . Anal fistula   . Pacemaker     St. Jude CRT-P device  . Atrial fibrillation     His Past Surgical History Is Significant For: Past Surgical History  Procedure Laterality Date  . Pacemaker insertion      St. Jude's  . Treatment fistula anal    . Shoulder surgery      Right    His Family History Is Significant For: Family History  Problem Relation Age of Onset  . Heart disease Mother   . Colon cancer Neg Hx   . Stroke Father     His Social History Is Significant For: History   Social History  . Marital Status: Married    Spouse Name: [name] Page  . Number of Children: 0  . Years of Education: some coll.   Occupational History  . SALES    Social History Main Topics  . Smoking status: Never Smoker   . Smokeless tobacco: Never Used  . Alcohol Use: No     Comment: Very rare  . Drug Use: No  . Sexual Activity: Not on file   Other Topics Concern  . None   Social History Narrative   Lives in [place].   Does a lot of traveling.   Exercises daily.   Daily Caffeine use.   Patient is right handed    His  Allergies Are:  No Known Allergies:   His Current Medications Are:  Outpatient Encounter Prescriptions as of 01/02/2015  Medication Sig  . carvedilol (COREG) 25 MG tablet Take 25 mg by mouth 2 (two) times daily.    . furosemide (LASIX) 40 MG tablet Take 40 mg by mouth as needed.    . hydrochlorothiazide 25 MG tablet Take 25 mg by mouth daily.    Marland Kitchen omeprazole (PRILOSEC) 20 MG capsule Take 20 mg by mouth daily.  . potassium chloride SA (K-DUR,KLOR-CON) 20 MEQ tablet Take 20 mEq by mouth as needed.    . ramipril (ALTACE) 10 MG capsule Take 10 mg by  mouth 2 (two) times daily.    . rosuvastatin (CRESTOR) 40 MG tablet Take 20 mg by mouth daily.    . traMADol (ULTRAM) 50 MG tablet Take 50 mg by mouth as needed for pain.  Marland Kitchen warfarin (COUMADIN) 5 MG tablet Take 5 mg by mouth daily.   . [DISCONTINUED] amoxicillin (AMOXIL) 500 MG capsule As needed when going to dentist,take 4 capsules  . [DISCONTINUED] omeprazole (PRILOSEC) 20 MG capsule Take 20 mg by mouth 2 (two) times daily.    :  Review of Systems:  Out of a complete 14 point review of systems, all are reviewed and negative with the exception of these symptoms as listed below:   Review of Systems  All other systems reviewed and are negative.   Objective:  Neurologic Exam  Physical Exam Physical Examination:   Filed Vitals:   01/02/15 1322  BP: 120/75  Pulse: 95  Temp: 97.6 F (36.4 C)  Resp: 18    General Examination: The patient is a very pleasant 69 y.o. male in no acute distress. He appears well-developed and well-nourished and well groomed. He is overweight.   HEENT: Normocephalic, atraumatic, pupils are equal, round and reactive to light and accommodation. Funduscopic exam is normal with sharp disc margins noted. Extraocular tracking is good without limitation to gaze excursion or nystagmus noted. Normal smooth pursuit is noted. Hearing is grossly intact. Face is symmetric with normal facial animation and normal facial  sensation. Speech is clear with no dysarthria noted. There is no hypophonia. There is no lip, neck/head, jaw or voice tremor. Neck is supple with full range of passive and active motion. There are no carotid bruits on auscultation. Oropharynx exam reveals: mild mouth dryness, adequate dental hygiene and moderate airway crowding, due to narrow airway entry and redundant soft palate and larger uvula and wider tongue base. Mallampati is class II. Tongue protrudes centrally and palate elevates symmetrically. Tonsils are absent. He has no overbite.    Chest: Clear to auscultation without wheezing, rhonchi or crackles noted.  Heart: S1+S2+0, regular and normal without murmurs, rubs or gallops noted.   Abdomen: Soft, non-tender and non-distended with normal bowel sounds appreciated on auscultation.  Extremities: There is trace pitting edema in the distal lower extremities bilaterally. Pedal pulses are intact.  Skin: Warm and dry without trophic changes noted. There are no varicose veins.  Musculoskeletal: exam reveals no obvious joint deformities, tenderness or joint swelling or erythema.   Neurologically:  Mental status: The patient is awake, alert and oriented in all 4 spheres. His immediate and remote memory, attention, language skills and fund of knowledge are appropriate. There is no evidence of aphasia, agnosia, apraxia or anomia. Speech is clear with normal prosody and enunciation. Thought process is linear. Mood is normal and affect is normal.  Cranial nerves II - XII are as described above under HEENT exam. In addition: shoulder shrug is normal with equal shoulder height noted. Motor exam: Normal bulk, strength and tone is noted. There is no drift, tremor or rebound. Romberg is negative. Reflexes are 2+ throughout. Fine motor skills and coordination: intact with normal finger taps, normal hand movements, normal rapid alternating patting, normal foot taps and normal foot agility.  Cerebellar  testing: No dysmetria or intention tremor on finger to nose testing. There is no truncal or gait ataxia.  Sensory exam: intact to light touch, pinprick, vibration, temperature sense in the upper and lower extremities.  Gait, station and balance: He stands easily. No veering to one  side is noted. No leaning to one side is noted. Posture is age-appropriate and stance is narrow based. Gait shows normal stride length and normal pace. No problems turning are noted. He turns en bloc. Tandem walk is unremarkable.   Assessment and Plan:   In summary, [name] Page is a very pleasant 69 year old male with an underlying medical history of hyperlipidemia, arthritis, A. fib, status post pacemaker placement, obesity, allergic rhinitis, BPH, who presents for followup consultation of his obstructive sleep apnea. His split-night sleep study in July 2015 showed moderate to severe obstructive sleep apnea. He had done rather well with CPAP at 9 cm with full compliance. There is some discrepancy in his report and the compliance data available. For some reason his settings are now AutoPap at 5-20 cm and I do not recall prescribing auto Pap for him. He had had great results with CPAP at 9 cm. He change DME providers in November 2015 and the changes may have something to do with his changing DME companies. I will try to get to the bottom of this. I discussed his sleep study results from last year with him again today and his previous compliance data was reviewed in comparison with the current data and there is some discrepancy as far as his average usage, his CPAP settings, and his report as to how long each night he uses this and what his machine displays. At this juncture, I would like to see him back in 6 months. He is encouraged to continue to using his machine regularly and he is advised that we will make sure his settings are correct. I answered all his questions today and he was in agreement. I spent 25 minutes in total  face-to-face time with the patient, more than 50% of which was spent in counseling and coordination of care, reviewing test results, reviewing medication and discussing or reviewing the diagnosis of OSA, its prognosis and treatment options.

## 2015-01-02 NOTE — Patient Instructions (Signed)
I will change your cpap setting back to 9 cm. Follow up in 6 months.

## 2015-01-03 NOTE — Telephone Encounter (Signed)
[facility] to perform a download and fax over to [place].

## 2015-01-03 NOTE — Telephone Encounter (Signed)
The CPAP compliance download report had his name on it but it looked like it was maybe someone else's? Leak was different, he was on AutoPap of 5-20 according to that, average usage was much less than what he reported and what he previously had documented, there is something wrong with his records on [facility]? Can they send me a download? We may need to download directly from his machine.

## 2015-01-03 NOTE — Telephone Encounter (Signed)
I called and spoke with [name] Page. At [facility] and the representative confirmed that the patient's CPAP machine is set to 9 cm with heated humidification.  Who provided the information stating his pressure had been adjusted?

## 2015-01-08 ENCOUNTER — Encounter: Payer: Self-pay | Admitting: Neurology

## 2015-01-09 ENCOUNTER — Encounter: Payer: Self-pay | Admitting: Internal Medicine

## 2015-01-12 ENCOUNTER — Encounter: Payer: Self-pay | Admitting: Internal Medicine

## 2015-01-31 DIAGNOSIS — G4733 Obstructive sleep apnea (adult) (pediatric): Secondary | ICD-10-CM | POA: Diagnosis not present

## 2015-02-06 DIAGNOSIS — I4891 Unspecified atrial fibrillation: Secondary | ICD-10-CM | POA: Diagnosis not present

## 2015-02-06 DIAGNOSIS — Z7901 Long term (current) use of anticoagulants: Secondary | ICD-10-CM | POA: Diagnosis not present

## 2015-02-07 ENCOUNTER — Encounter: Payer: Self-pay | Admitting: Internal Medicine

## 2015-03-02 DIAGNOSIS — G4733 Obstructive sleep apnea (adult) (pediatric): Secondary | ICD-10-CM | POA: Diagnosis not present

## 2015-03-13 ENCOUNTER — Ambulatory Visit (INDEPENDENT_AMBULATORY_CARE_PROVIDER_SITE_OTHER): Payer: Commercial Managed Care - HMO | Admitting: *Deleted

## 2015-03-13 DIAGNOSIS — I442 Atrioventricular block, complete: Secondary | ICD-10-CM

## 2015-03-13 DIAGNOSIS — I4891 Unspecified atrial fibrillation: Secondary | ICD-10-CM | POA: Diagnosis not present

## 2015-03-13 DIAGNOSIS — Z7901 Long term (current) use of anticoagulants: Secondary | ICD-10-CM | POA: Diagnosis not present

## 2015-03-14 NOTE — Progress Notes (Signed)
Remote pacemaker transmission.   

## 2015-03-21 LAB — CUP PACEART REMOTE DEVICE CHECK
Battery Remaining Percentage: 38 %
Battery Voltage: 2.86 V
Lead Channel Pacing Threshold Amplitude: 0.75 V
Lead Channel Pacing Threshold Pulse Width: 0.8 ms
Lead Channel Setting Pacing Amplitude: 2 V
Lead Channel Setting Pacing Pulse Width: 0.5 ms
Lead Channel Setting Sensing Sensitivity: 4 mV
MDC IDC MSMT BATTERY REMAINING LONGEVITY: 35 mo
MDC IDC MSMT LEADCHNL LV IMPEDANCE VALUE: 460 Ohm
MDC IDC MSMT LEADCHNL LV PACING THRESHOLD PULSEWIDTH: 0.5 ms
MDC IDC MSMT LEADCHNL RV IMPEDANCE VALUE: 390 Ohm
MDC IDC MSMT LEADCHNL RV PACING THRESHOLD AMPLITUDE: 0.75 V
MDC IDC MSMT LEADCHNL RV SENSING INTR AMPL: 6.3 mV
MDC IDC SESS DTM: 20160531062242
MDC IDC SET LEADCHNL RV PACING AMPLITUDE: 2 V
MDC IDC SET LEADCHNL RV PACING PULSEWIDTH: 0.8 ms
Pulse Gen Model: 3210
Pulse Gen Serial Number: 2227415

## 2015-03-23 ENCOUNTER — Encounter: Payer: Self-pay | Admitting: Cardiology

## 2015-03-27 ENCOUNTER — Encounter: Payer: Self-pay | Admitting: Internal Medicine

## 2015-04-02 DIAGNOSIS — G4733 Obstructive sleep apnea (adult) (pediatric): Secondary | ICD-10-CM | POA: Diagnosis not present

## 2015-04-06 ENCOUNTER — Encounter: Payer: Self-pay | Admitting: Internal Medicine

## 2015-04-06 ENCOUNTER — Ambulatory Visit (INDEPENDENT_AMBULATORY_CARE_PROVIDER_SITE_OTHER): Payer: Commercial Managed Care - HMO | Admitting: Internal Medicine

## 2015-04-06 VITALS — BP 128/78 | HR 72 | Ht 73.0 in | Wt 250.0 lb

## 2015-04-06 DIAGNOSIS — Z5181 Encounter for therapeutic drug level monitoring: Secondary | ICD-10-CM

## 2015-04-06 DIAGNOSIS — I480 Paroxysmal atrial fibrillation: Secondary | ICD-10-CM | POA: Diagnosis not present

## 2015-04-06 DIAGNOSIS — K219 Gastro-esophageal reflux disease without esophagitis: Secondary | ICD-10-CM | POA: Diagnosis not present

## 2015-04-06 DIAGNOSIS — Z86010 Personal history of colonic polyps: Secondary | ICD-10-CM | POA: Diagnosis not present

## 2015-04-06 DIAGNOSIS — Z7901 Long term (current) use of anticoagulants: Secondary | ICD-10-CM

## 2015-04-06 MED ORDER — NA SULFATE-K SULFATE-MG SULF 17.5-3.13-1.6 GM/177ML PO SOLN: 1.0000 | Freq: Once | ORAL | Status: DC

## 2015-04-06 NOTE — Progress Notes (Signed)
HISTORY OF PRESENT ILLNESS:  [name] Page is a 69 y.o. male with multiple medical problems as listed below including a history of atrial fibrillation for which he underwent AV node ablation with subsequent pacemaker placement. He is on chronic Coumadin therapy. The patient presents today for surveillance colonoscopy. He has a history of multiple hyperplastic polyps including large right-sided lesion. His last colonoscopy was performed 03/14/2010. 9 polyps removed. All described as hyperplastic. Follow-up in 5 years recommended. Patient's GI review of systems is entirely negative. He has had no problems with heart irregularity in years. His Coumadin is managed by Reynaldo Minium.  REVIEW OF SYSTEMS:  All non-GI ROS negative upon complete evaluation   Past Medical History  Diagnosis Date  . Chronic atrial fibrillation     s/p AV nodal ablation and on Coumadin. Patient developed dyspnea/fatigue with RV  pacing and an LV lead was placed  . Cardiomyopathy     Nonishemic, possibly tachycardia-mediated. 1997 LHC with no significant CAD. Myoview (7/09) showed EF 44% with possible mild inferior ischemia. Echo (9/10): EF 60%  . Upper GI bleed     With duodenal ulcer in 2005.  Patient was on ASA and coumadin at that time. ASA was stopped..  . Pseudogout   . Osteoarthritis of right shoulder region   . Hyperlipidemia   . HTN (hypertension)   . Hyperplastic colon polyp     and adenomatous  . Anal fistula   . Pacemaker     St. Jude CRT-P device  . Atrial fibrillation   . Colon polyp     hyperplastic and adenomatous  . CHF (congestive heart failure)   . Anal fissure     Past Surgical History  Procedure Laterality Date  . Pacemaker insertion      St. Jude's  . Treatment fistula anal    . Shoulder surgery      Right    Social History [name] Page  reports that he has never smoked. He has never used smokeless tobacco. He reports that he does not drink alcohol or use illicit drugs.  family  history includes Heart disease in his mother; Stroke in his father. There is no history of Colon cancer.  No Known Allergies     PHYSICAL EXAMINATION: Vital signs: BP 128/78 mmHg  Pulse 72  Ht 6\' 1"  (1.854 m)  Wt 250 lb (113.399 kg)  BMI 32.99 kg/m2 General: Well-developed, well-nourished, no acute distress HEENT: Sclerae are anicteric, conjunctiva pink. Oral mucosa intact Lungs: Clear Heart: Regular Abdomen: soft, nontender, nondistended, no obvious ascites, no peritoneal signs, normal bowel sounds. No organomegaly. Extremities: No edema Psychiatric: alert and oriented x3. Cooperative    ASSESSMENT:  #1. History of multiple hyperplastic polyps including large sessile right-sided lesion #2. Multiple medical problems. On chronic Coumadin #3. GERD. Asymptomatic on PPI  PLAN:  #1. Surveillance colonoscopy.The nature of the procedure, as well as the risks, benefits, and alternatives were carefully and thoroughly reviewed with the patient. Ample time for discussion and questions allowed. The patient understood, was satisfied, and agreed to proceed. #2. Hold Coumadin 5 days prior to the procedure. We discussed the risk benefit issues related. Should be low risk since he's been in normal sinus rhythm for years. We'll confirm with Dr. Reynaldo Minium #3. Continue PPI for GERD symptom control

## 2015-04-06 NOTE — Patient Instructions (Signed)
You have been scheduled for a colonoscopy. Please follow written instructions given to you at your visit today.  Please pick up your prep supplies at the pharmacy within the next 1-3 days. If you use inhalers (even only as needed), please bring them with you on the day of your procedure.   

## 2015-04-11 ENCOUNTER — Telehealth: Payer: Self-pay

## 2015-04-11 NOTE — Telephone Encounter (Signed)
  04/11/2015   RE: [name] Page DOB: [birth year] MRN: [id number]   Dear Dr. Reynaldo Minium,    We have scheduled the above patient for an endoscopic procedure. Our records show that he is on anticoagulation therapy.   Please advise as to how long the patient may come off his therapy of Coumadin prior to the procedure, which is scheduled for 05/17/2015.  Please fax back/ or route the completed form to [facility] at [phone].   Sincerely,    Phillis Haggis

## 2015-04-27 DIAGNOSIS — Z7901 Long term (current) use of anticoagulants: Secondary | ICD-10-CM | POA: Diagnosis not present

## 2015-04-27 DIAGNOSIS — Z125 Encounter for screening for malignant neoplasm of prostate: Secondary | ICD-10-CM | POA: Diagnosis not present

## 2015-04-27 DIAGNOSIS — I10 Essential (primary) hypertension: Secondary | ICD-10-CM | POA: Diagnosis not present

## 2015-04-27 DIAGNOSIS — E785 Hyperlipidemia, unspecified: Secondary | ICD-10-CM | POA: Diagnosis not present

## 2015-04-27 DIAGNOSIS — I4891 Unspecified atrial fibrillation: Secondary | ICD-10-CM | POA: Diagnosis not present

## 2015-04-30 NOTE — Telephone Encounter (Signed)
Faxed this letter for a second time - called and was a more direct fax number to use

## 2015-05-02 DIAGNOSIS — G4733 Obstructive sleep apnea (adult) (pediatric): Secondary | ICD-10-CM | POA: Diagnosis not present

## 2015-05-02 NOTE — Telephone Encounter (Signed)
Lm on vm regarding holding coumadin for 5 days prior to procedure.

## 2015-05-02 NOTE — Telephone Encounter (Signed)
Spoke with patient and gave him his instructions for Coumadin per Dr. Reynaldo Minium - hold for 5 days prior to his prior to his procedure.  Patient acknowledged and understood

## 2015-05-03 DIAGNOSIS — Z23 Encounter for immunization: Secondary | ICD-10-CM | POA: Diagnosis not present

## 2015-05-03 DIAGNOSIS — N401 Enlarged prostate with lower urinary tract symptoms: Secondary | ICD-10-CM | POA: Diagnosis not present

## 2015-05-03 DIAGNOSIS — I4891 Unspecified atrial fibrillation: Secondary | ICD-10-CM | POA: Diagnosis not present

## 2015-05-03 DIAGNOSIS — M1990 Unspecified osteoarthritis, unspecified site: Secondary | ICD-10-CM | POA: Diagnosis not present

## 2015-05-03 DIAGNOSIS — E785 Hyperlipidemia, unspecified: Secondary | ICD-10-CM | POA: Diagnosis not present

## 2015-05-03 DIAGNOSIS — G4730 Sleep apnea, unspecified: Secondary | ICD-10-CM | POA: Diagnosis not present

## 2015-05-03 DIAGNOSIS — Z6832 Body mass index (BMI) 32.0-32.9, adult: Secondary | ICD-10-CM | POA: Diagnosis not present

## 2015-05-03 DIAGNOSIS — I10 Essential (primary) hypertension: Secondary | ICD-10-CM | POA: Diagnosis not present

## 2015-05-03 DIAGNOSIS — Z0000 Encounter for general adult medical examination without abnormal findings: Secondary | ICD-10-CM | POA: Diagnosis not present

## 2015-05-17 ENCOUNTER — Encounter: Payer: Self-pay | Admitting: Internal Medicine

## 2015-05-17 ENCOUNTER — Ambulatory Visit (AMBULATORY_SURGERY_CENTER): Payer: Commercial Managed Care - HMO | Admitting: Internal Medicine

## 2015-05-17 VITALS — BP 130/75 | HR 70 | Temp 97.6°F | Resp 18 | Ht 73.0 in | Wt 250.0 lb

## 2015-05-17 DIAGNOSIS — I509 Heart failure, unspecified: Secondary | ICD-10-CM | POA: Diagnosis not present

## 2015-05-17 DIAGNOSIS — I10 Essential (primary) hypertension: Secondary | ICD-10-CM | POA: Diagnosis not present

## 2015-05-17 DIAGNOSIS — Z86010 Personal history of colonic polyps: Secondary | ICD-10-CM

## 2015-05-17 DIAGNOSIS — K219 Gastro-esophageal reflux disease without esophagitis: Secondary | ICD-10-CM | POA: Diagnosis not present

## 2015-05-17 DIAGNOSIS — I4891 Unspecified atrial fibrillation: Secondary | ICD-10-CM | POA: Diagnosis not present

## 2015-05-17 DIAGNOSIS — D123 Benign neoplasm of transverse colon: Secondary | ICD-10-CM | POA: Diagnosis not present

## 2015-05-17 MED ORDER — SODIUM CHLORIDE 0.9 % IV SOLN: 500.0000 mL | INTRAVENOUS | Status: DC

## 2015-05-17 NOTE — Patient Instructions (Signed)
YOU HAD AN ENDOSCOPIC PROCEDURE TODAY AT [facility]:   Refer to the procedure report that was given to you for any specific questions about what was found during the examination.  If the procedure report does not answer your questions, please call your gastroenterologist to clarify.  If you requested that your care partner not be given the details of your procedure findings, then the procedure report has been included in a sealed envelope for you to review at your convenience later.  YOU SHOULD EXPECT: Some feelings of bloating in the abdomen. Passage of more gas than usual.  Walking can help get rid of the air that was put into your GI tract during the procedure and reduce the bloating. If you had a lower endoscopy (such as a colonoscopy or flexible sigmoidoscopy) you may notice spotting of blood in your stool or on the toilet paper. If you underwent a bowel prep for your procedure, you may not have a normal bowel movement for a few days.  Please Note:  You might notice some irritation and congestion in your nose or some drainage.  This is from the oxygen used during your procedure.  There is no need for concern and it should clear up in a day or so.  SYMPTOMS TO REPORT IMMEDIATELY:   Following lower endoscopy (colonoscopy or flexible sigmoidoscopy):  Excessive amounts of blood in the stool  Significant tenderness or worsening of abdominal pains  Swelling of the abdomen that is new, acute  Fever of 100F or higher  For urgent or emergent issues, a gastroenterologist can be reached at any hour by calling [phone].   DIET: Your first meal following the procedure should be a small meal and then it is ok to progress to your normal diet. Heavy or fried foods are harder to digest and may make you feel nauseous or bloated.  Likewise, meals heavy in dairy and vegetables can increase bloating.  Drink plenty of fluids but you should avoid alcoholic beverages for 24  hours.  ACTIVITY:  You should plan to take it easy for the rest of today and you should NOT DRIVE or use heavy machinery until tomorrow (because of the sedation medicines used during the test).    FOLLOW UP: Our staff will call the number listed on your records the next business day following your procedure to check on you and address any questions or concerns that you may have regarding the information given to you following your procedure. If we do not reach you, we will leave a message.  However, if you are feeling well and you are not experiencing any problems, there is no need to return our call.  We will assume that you have returned to your regular daily activities without incident.  If any biopsies were taken you will be contacted by phone or by letter within the next 1-3 weeks.  Please call us at [phone] if you have not heard about the biopsies in 3 weeks.    SIGNATURES/CONFIDENTIALITY: You and/or your care partner have signed paperwork which will be entered into your electronic medical record.  These signatures attest to the fact that that the information above on your After Visit Summary has been reviewed and is understood.  Full responsibility of the confidentiality of this discharge information lies with you and/or your care-partner.  Repeat Colonoscopy in 5 years if polyp adenomatous; otherwise 10 years

## 2015-05-17 NOTE — Op Note (Signed)
[place]  [address] [place], [id number]   COLONOSCOPY PROCEDURE REPORT  PATIENT: [name] Page, [name] Page  MR#: [id number] BIRTHDATE: [birth year] , 49  yrs. old GENDER: male ENDOSCOPIST: Eustace Quail, MD REFERRED [id number] Program Recall PROCEDURE DATE:  05/17/2015 PROCEDURE:   Colonoscopy, surveillance and Colonoscopy with snare polypectomy X1 First Screening Colonoscopy - Avg.  risk and is 50 yrs.  old or older - No.  Prior Negative Screening - Now for repeat screening. N/A  History of Adenoma - Now for follow-up colonoscopy & has been > or = to 3 yrs.  N/A  Polyps removed today? Yes ASA CLASS:   Class III INDICATIONS:Surveillance due to prior colonic neoplasia and Patient is not applicable for Colorectal Neoplasm Risk Assessment for this procedure.  . Previous examinations 2004, 2006, and 2011 with multiple and large hyperplastic polyps, including right-sided sessile polyps MEDICATIONS: Monitored anesthesia care and Propofol 300 mg IV  DESCRIPTION OF PROCEDURE:   After the risks benefits and alternatives of the procedure were thoroughly explained, informed consent was obtained.  The digital rectal exam revealed no abnormalities of the rectum.   The LB [id number]  endoscope was introduced through the anus and advanced to the cecum, which was identified by both the appendix and ileocecal valve. No adverse events experienced.   The quality of the prep was good.  (Suprep was used)  The instrument was then slowly withdrawn as the colon was fully examined. Estimated blood loss is zero unless otherwise noted in this procedure report.      COLON FINDINGS: A single polyp measuring 4 mm in size was found in the transverse colon.  A polypectomy was performed with a cold snare.  The resection was complete, the polyp tissue was completely retrieved and sent to histology.   The examination was otherwise normal.  Retroflexed views revealed internal  hemorrhoids. The time to cecum = 1.7 Withdrawal time = 11.8   The scope was withdrawn and the procedure completed. COMPLICATIONS: There were no immediate complications.  ENDOSCOPIC IMPRESSION: 1.   Single polyp was found in the transverse colon; polypectomy was performed with a cold snare 2.   The examination was otherwise normal  RECOMMENDATIONS: 1. Repeat colonoscopy in 5 years if polyp adenomatous; otherwise 10 years  eSigned:  Eustace Quail, MD 05/17/2015 3:20 PM   cc: The Patient and [name], MD

## 2015-05-17 NOTE — Progress Notes (Signed)
Report to PACU, RN, vss, BBS= Clear.  

## 2015-05-17 NOTE — Progress Notes (Signed)
Called to room to assist during endoscopic procedure.  Patient ID and intended procedure confirmed with present staff. Received instructions for my participation in the procedure from the performing physician.  

## 2015-05-18 ENCOUNTER — Telehealth: Payer: Self-pay | Admitting: *Deleted

## 2015-05-18 NOTE — Telephone Encounter (Signed)
  Follow up Call-  Call back number 05/17/2015  Post procedure Call Back phone  # [phone]  Permission to leave phone message Yes     Patient questions:  Do you have a fever, pain , or abdominal swelling? No. Pain Score  0 *  Have you tolerated food without any problems? Yes.    Have you been able to return to your normal activities? Yes.    Do you have any questions about your discharge instructions: Diet   No. Medications  No. Follow up visit  No.  Do you have questions or concerns about your Care? No.  Actions: * If pain score is 4 or above: No action needed, pain <4.

## 2015-05-29 ENCOUNTER — Encounter: Payer: Self-pay | Admitting: Internal Medicine

## 2015-05-31 DIAGNOSIS — I4891 Unspecified atrial fibrillation: Secondary | ICD-10-CM | POA: Diagnosis not present

## 2015-05-31 DIAGNOSIS — Z7901 Long term (current) use of anticoagulants: Secondary | ICD-10-CM | POA: Diagnosis not present

## 2015-06-01 DIAGNOSIS — G4733 Obstructive sleep apnea (adult) (pediatric): Secondary | ICD-10-CM | POA: Diagnosis not present

## 2015-06-02 DIAGNOSIS — G4733 Obstructive sleep apnea (adult) (pediatric): Secondary | ICD-10-CM | POA: Diagnosis not present

## 2015-06-12 ENCOUNTER — Encounter: Payer: Self-pay | Admitting: Internal Medicine

## 2015-06-12 ENCOUNTER — Ambulatory Visit (INDEPENDENT_AMBULATORY_CARE_PROVIDER_SITE_OTHER): Payer: Commercial Managed Care - HMO | Admitting: Internal Medicine

## 2015-06-12 VITALS — BP 132/82 | HR 74 | Ht 73.0 in | Wt 254.8 lb

## 2015-06-12 DIAGNOSIS — I10 Essential (primary) hypertension: Secondary | ICD-10-CM | POA: Diagnosis not present

## 2015-06-12 DIAGNOSIS — I482 Chronic atrial fibrillation, unspecified: Secondary | ICD-10-CM

## 2015-06-12 DIAGNOSIS — Z950 Presence of cardiac pacemaker: Secondary | ICD-10-CM | POA: Diagnosis not present

## 2015-06-12 DIAGNOSIS — I480 Paroxysmal atrial fibrillation: Secondary | ICD-10-CM

## 2015-06-12 DIAGNOSIS — I442 Atrioventricular block, complete: Secondary | ICD-10-CM

## 2015-06-12 LAB — CUP PACEART INCLINIC DEVICE CHECK
Brady Statistic RV Percent Paced: 99.63 %
Date Time Interrogation Session: 20160830135810
Lead Channel Pacing Threshold Amplitude: 0.75 V
Lead Channel Pacing Threshold Amplitude: 1 V
Lead Channel Pacing Threshold Pulse Width: 0.5 ms
Lead Channel Pacing Threshold Pulse Width: 0.8 ms
Lead Channel Sensing Intrinsic Amplitude: 6.7 mV
Lead Channel Setting Pacing Amplitude: 2 V
Lead Channel Setting Pacing Pulse Width: 0.5 ms
Lead Channel Setting Pacing Pulse Width: 0.8 ms
MDC IDC MSMT BATTERY REMAINING LONGEVITY: 30 mo
MDC IDC MSMT BATTERY VOLTAGE: 2.83 V
MDC IDC MSMT LEADCHNL LV IMPEDANCE VALUE: 487.5 Ohm
MDC IDC MSMT LEADCHNL LV PACING THRESHOLD AMPLITUDE: 0.75 V
MDC IDC MSMT LEADCHNL LV PACING THRESHOLD PULSEWIDTH: 0.5 ms
MDC IDC MSMT LEADCHNL RV IMPEDANCE VALUE: 425 Ohm
MDC IDC MSMT LEADCHNL RV PACING THRESHOLD AMPLITUDE: 1 V
MDC IDC MSMT LEADCHNL RV PACING THRESHOLD PULSEWIDTH: 0.8 ms
MDC IDC SET LEADCHNL RV PACING AMPLITUDE: 2 V
MDC IDC SET LEADCHNL RV SENSING SENSITIVITY: 4 mV
MDC IDC STAT BRADY RA PERCENT PACED: 0 %
Pulse Gen Serial Number: 2227415

## 2015-06-12 NOTE — Assessment & Plan Note (Signed)
His ventricular rate is well controlled. He has switched from warfarin to Eliquis. No problems.

## 2015-06-12 NOTE — Progress Notes (Signed)
HPI [name] Page returns today for followup. He is a pleasant 69 yo man with a h/o HTN, chronic atrial fibrillation, s/p AV node ablation, and PPM. He continues to work full-time. He denies chest pain, shortness of breath, or syncope. He has mild peripheral edema. No Known Allergies   Current Outpatient Prescriptions  Medication Sig Dispense Refill  . carvedilol (COREG) 25 MG tablet Take 25 mg by mouth 2 (two) times daily.      . colchicine 0.6 MG tablet     . ELIQUIS 5 MG TABS tablet Take 5 mg by mouth 2 (two) times daily.    . furosemide (LASIX) 40 MG tablet Take 40 mg by mouth as needed.      . hydrochlorothiazide 25 MG tablet Take 25 mg by mouth daily.      Marland Kitchen omeprazole (PRILOSEC) 20 MG capsule     . potassium chloride SA (K-DUR,KLOR-CON) 20 MEQ tablet Take 20 mEq by mouth as needed.      . ramipril (ALTACE) 10 MG capsule Take 10 mg by mouth 2 (two) times daily.      . rosuvastatin (CRESTOR) 40 MG tablet Take 20 mg by mouth daily.      . traMADol (ULTRAM) 50 MG tablet Take 50 mg by mouth as needed for pain.     No current facility-administered medications for this visit.     Past Medical History  Diagnosis Date  . Chronic atrial fibrillation     s/p AV nodal ablation and on Coumadin. Patient developed dyspnea/fatigue with RV  pacing and an LV lead was placed  . Cardiomyopathy     Nonishemic, possibly tachycardia-mediated. 1997 LHC with no significant CAD. Myoview (7/09) showed EF 44% with possible mild inferior ischemia. Echo (9/10): EF 60%  . Upper GI bleed     With duodenal ulcer in 2005.  Patient was on ASA and coumadin at that time. ASA was stopped..  . Pseudogout   . Osteoarthritis of right shoulder region   . Hyperlipidemia   . HTN (hypertension)   . Hyperplastic colon polyp     and adenomatous  . Anal fistula   . Pacemaker     St. Jude CRT-P device  . Atrial fibrillation   . Colon polyp     hyperplastic and adenomatous  . CHF (congestive heart failure)   . Anal  fissure   . Blood transfusion without reported diagnosis   . Cataract   . Sleep apnea     ROS:   All systems reviewed and negative except as noted in the HPI.   Past Surgical History  Procedure Laterality Date  . Pacemaker insertion      St. Jude's  . Treatment fistula anal    . Shoulder surgery      Right     Family History  Problem Relation Age of Onset  . Heart disease Mother   . Colon cancer Neg Hx   . Stroke Father   . Diverticulitis Sister      Social History   Social History  . Marital Status: Married    Spouse Name: [name] Page  . Number of Children: 0  . Years of Education: some coll.   Occupational History  . SALES    Social History Main Topics  . Smoking status: Never Smoker   . Smokeless tobacco: Never Used  . Alcohol Use: No     Comment: Very rare  . Drug Use: No  . Sexual Activity: Not on file  Other Topics Concern  . Not on file   Social History Narrative   Lives in [place].   Does a lot of traveling.   Exercises daily.   Daily Caffeine use.   Patient is right handed     BP 132/82 mmHg  Pulse 74  Ht 6\' 1"  (1.854 m)  Wt 254 lb 12.8 oz (115.577 kg)  BMI 33.62 kg/m2  Physical Exam:  Well appearing middle-aged man, NAD HEENT: Unremarkable Neck:  7 cm JVD, no thyromegally Back:  No CVA tenderness Lungs:  Clear with no wheezes, rales, or rhonchi. Well-healed pacemaker incision. HEART:  Regular rate rhythm, no murmurs, no rubs, no clicks Abd:  soft, positive bowel sounds, no organomegally, no rebound, no guarding Ext:  2 plus pulses, no edema, no cyanosis, no clubbing Skin:  No rashes no nodules Neuro:  CN II through XII intact, motor grossly intact  EKG -atrial fibrillation with biventricular pacing  DEVICE  Normal device function.  See PaceArt for details.   Assess/Plan:

## 2015-06-12 NOTE — Assessment & Plan Note (Signed)
His blood pressure is well controlled. Will follow. He is encouraged to maintain a low sodium diet.

## 2015-06-12 NOTE — Patient Instructions (Signed)
Medication Instructions:  Your physician recommends that you continue on your current medications as directed. Please refer to the Current Medication list given to you today.   Labwork: None ordered  Testing/Procedures: None ordered  Follow-Up: Your physician wants you to follow-up in: 12 months with Dr [name] You will receive a reminder letter in the mail two months in advance. If you don't receive a letter, please call our office to schedule the follow-up appointment.  Remote monitoring is used to monitor your Pacemaker  from home. This monitoring reduces the number of office visits required to check your device to one time per year. It allows us to keep an eye on the functioning of your device to ensure it is working properly. You are scheduled for a device check from home on 09/11/15. You may send your transmission at any time that day. If you have a wireless device, the transmission will be sent automatically. After your physician reviews your transmission, you will receive a postcard with your next transmission date.    Any Other Special Instructions Will Be Listed Below (If Applicable).   

## 2015-06-12 NOTE — Assessment & Plan Note (Signed)
His St. Jude BiV PM is working normally. Will follow and recheck in several months.

## 2015-07-03 DIAGNOSIS — G4733 Obstructive sleep apnea (adult) (pediatric): Secondary | ICD-10-CM | POA: Diagnosis not present

## 2015-07-05 ENCOUNTER — Ambulatory Visit (INDEPENDENT_AMBULATORY_CARE_PROVIDER_SITE_OTHER): Payer: Commercial Managed Care - HMO | Admitting: Neurology

## 2015-07-05 ENCOUNTER — Encounter: Payer: Self-pay | Admitting: Neurology

## 2015-07-05 VITALS — BP 142/84 | HR 72 | Resp 18 | Ht 73.0 in | Wt 246.0 lb

## 2015-07-05 DIAGNOSIS — I4891 Unspecified atrial fibrillation: Secondary | ICD-10-CM | POA: Diagnosis not present

## 2015-07-05 DIAGNOSIS — Z950 Presence of cardiac pacemaker: Secondary | ICD-10-CM

## 2015-07-05 DIAGNOSIS — Z9989 Dependence on other enabling machines and devices: Principal | ICD-10-CM

## 2015-07-05 DIAGNOSIS — G4733 Obstructive sleep apnea (adult) (pediatric): Secondary | ICD-10-CM

## 2015-07-05 NOTE — Patient Instructions (Signed)

## 2015-07-05 NOTE — Progress Notes (Signed)
Subjective:    Patient ID: [name] Page is a 69 y.o. male.  HPI     Interim history:  [name] Page is a very pleasant 70 year old right-handed gentleman with an underlying medical history of hyperlipidemia, arthritis, A. fib, status post pacemaker placement, obesity, allergic rhinitis, BPH, who presents for followup consultation of his obstructive sleep apnea, on treatment with CPAP therapy. The patient is unaccompanied today. I last saw him on 01/02/2015, at which time he reported treatment compliance with CPAP. He changed from advanced home care to [place]. He retired in October 2015 and has been playing more golf. He uses his elliptical daily for about 30 minutes.  Today, 07/05/2015: I reviewed his CPAP compliance data from 06/04/2015 through 07/03/2015 which is a total of 30 days during which time he used his machine every night with percent used days greater than 4 hours at 100%, indicating superb compliance with an average usage of 7 hours and 13 minutes, residual AHI low at 0.9 per hour, leak acceptable with the 95th percentile at 14.8 L/m on a pressure of 9 cm with EPR of 2.  Today, 07/05/2015: He reports doing well. He is fully compliant with treatment. In fact, he states that he loves his CPAP machine. He is requested a heated tubing for his machine. He has switched DME providers. He is no longer on Coumadin. He is taking Eliquis, he has a pacemaker check pending for this year in November.  Previously:  I saw him on 06/29/2014, at which time he reported doing much better. He felt, he was sleeping better, his wife was pleased with how he was sleeping, his daytime somnolence and sleep consolidation were improved. He denied any restless leg symptoms and was not bothered by any leg movements or twitching at night. Overall he had done really well and was very pleased. He was completely compliant with treatment and had no issues adjusting to the machine and the mask. He was using nasal pillows and  liked them.  I reviewed his CPAP compliance data from 12/02/2014 through 12/31/2014 which is a total of 30 days during which time he used his machine 28 days with percent used days greater than 4 hours of only 50%, indicating suboptimal compliance with an average usage of 3 hours and 59 minutes. 95th percentile of pressure was 10.2. Leak acceptable with the 95th percentile of leak at 12.9 L/m. Residual AHI at 3.8 per hour.   I first met him on 04/04/2014 at the request of his primary care physician, at which time the patient reported snoring, significant daytime somnolence and witnessed apneas per wife. I invited him back for sleep study. He had a split-night sleep study on 04/19/2014, and I went over his test results with him in detail today. Baseline sleep efficiency was significantly reduced at 53.9% with a long latency to sleep of 39.5 minutes and wake after sleep onset of 67.5 minutes with moderate sleep fragmentation noted. He had an elevated arousal index. He had an increased percentage of stage I and stage II sleep as well as absence of slow-wave and REM sleep. He had no significant periodic leg movements and EKG showed known A. fib. He had a paced cardiac rhythm. His total AHI was elevated at 25 per hour. Baseline oxygen saturation was 93%, nadir was 73%. He was then titrated on CPAP. Sleep efficiency was 67%. He had a absence of deep sleep but increased percentage of dream sleep at 47.1% after CPAP. His average oxygen saturation was 93%  with a nadir of 89%. He was titrated from 5-9 cm with a reduction of his AHI to 0 events per hour at the final pressure with supine REM sleep achieved. He had severe periodic leg movements during the treatment portion of the study but no significant arousals. Based on the test results I prescribed CPAP for him.  I reviewed the patient's CPAP compliance data from 05/16/2014 to 06/14/2014, which is a total of 30 days, during which time the patient used CPAP every day.  The average usage for all days was 7 hours and 23 minutes. The percent used days greater than 4 hours was 100 %, indicating superb compliance. The residual AHI was 0.6 per hour, indicating an appropriate treatment pressure of 9 cwp with EPR of 2. Air leak from the mask was very low at 3 L per minute at the 95th percentile.  I reviewed his compliance data from 05/29/2014 through 06/27/2014 which is a total of 30 days during which time he used his machine every night with percent used days greater than 4 hours of 100%, again indicating flawless compliance. His average usage was 7 hours and 25 minutes. His residual AHI low at 0.3 with leak very low at 0.8 at the current pressure of 9 cm with EPR of 2.  His Past Medical History Is Significant For: Past Medical History  Diagnosis Date  . Chronic atrial fibrillation     s/p AV nodal ablation and on Coumadin. Patient developed dyspnea/fatigue with RV  pacing and an LV lead was placed  . Cardiomyopathy     Nonishemic, possibly tachycardia-mediated. 1997 LHC with no significant CAD. Myoview (7/09) showed EF 44% with possible mild inferior ischemia. Echo (9/10): EF 60%  . Upper GI bleed     With duodenal ulcer in 2005.  Patient was on ASA and coumadin at that time. ASA was stopped..  . Pseudogout   . Osteoarthritis of right shoulder region   . Hyperlipidemia   . HTN (hypertension)   . Hyperplastic colon polyp     and adenomatous  . Anal fistula   . Pacemaker     St. Jude CRT-P device  . Atrial fibrillation   . Colon polyp     hyperplastic and adenomatous  . CHF (congestive heart failure)   . Anal fissure   . Blood transfusion without reported diagnosis   . Cataract   . Sleep apnea     His Past Surgical History Is Significant For: Past Surgical History  Procedure Laterality Date  . Pacemaker insertion      St. Jude's  . Treatment fistula anal    . Shoulder surgery      Right    His Family History Is Significant For: Family History   Problem Relation Age of Onset  . Heart disease Mother   . Colon cancer Neg Hx   . Stroke Father   . Diverticulitis Sister     His Social History Is Significant For: Social History   Social History  . Marital Status: Married    Spouse Name: [name] Page  . Number of Children: 0  . Years of Education: some coll.   Occupational History  . SALES    Social History Main Topics  . Smoking status: Never Smoker   . Smokeless tobacco: Never Used  . Alcohol Use: No     Comment: Very rare  . Drug Use: No  . Sexual Activity: Not Asked   Other Topics Concern  . None   Social  History Narrative   Lives in [place].   Does a lot of traveling.   Exercises daily.   Daily Caffeine use.   Patient is right handed    His Allergies Are:  No Known Allergies:   His Current Medications Are:  Outpatient Encounter Prescriptions as of 07/05/2015  Medication Sig  . carvedilol (COREG) 25 MG tablet Take 25 mg by mouth 2 (two) times daily.    . colchicine 0.6 MG tablet   . ELIQUIS 5 MG TABS tablet Take 5 mg by mouth 2 (two) times daily.  . furosemide (LASIX) 40 MG tablet Take 40 mg by mouth as needed.    . hydrochlorothiazide 25 MG tablet Take 25 mg by mouth daily.    Marland Kitchen omeprazole (PRILOSEC) 20 MG capsule   . potassium chloride SA (K-DUR,KLOR-CON) 20 MEQ tablet Take 20 mEq by mouth as needed.    . ramipril (ALTACE) 10 MG capsule Take 10 mg by mouth 2 (two) times daily.    . rosuvastatin (CRESTOR) 40 MG tablet Take 20 mg by mouth daily.    . traMADol (ULTRAM) 50 MG tablet Take 50 mg by mouth as needed for pain.   No facility-administered encounter medications on file as of 07/05/2015.  :  Review of Systems:  Out of a complete 14 point review of systems, all are reviewed and negative with the exception of these symptoms as listed below:   Review of Systems  Neurological:       Patient states that he has no new concerns.   He reports that the tubing is not heated and would like orders for  heated tubing.     Objective:  Neurologic Exam  Physical Exam Physical Examination:   Filed Vitals:   07/05/15 1329  BP: 142/84  Pulse: 72  Resp: 18    General Examination: The patient is a very pleasant 69 y.o. male in no acute distress. He appears well-developed and well-nourished and well groomed. He is overweight.   HEENT: Normocephalic, atraumatic, pupils are equal, round and reactive to light and accommodation. Funduscopic exam is normal with sharp disc margins noted. Extraocular tracking is good without limitation to gaze excursion or nystagmus noted. Normal smooth pursuit is noted. Hearing is grossly intact. Face is symmetric with normal facial animation and normal facial sensation. Speech is clear with no dysarthria noted. There is no hypophonia. There is no lip, neck/head, jaw or voice tremor. Neck is supple with full range of passive and active motion. There are no carotid bruits on auscultation. Oropharynx exam reveals: mild mouth dryness, adequate dental hygiene and moderate airway crowding, due to narrow airway entry and redundant soft palate and larger uvula and wider tongue base. Mallampati is class II. Tongue protrudes centrally and palate elevates symmetrically. Tonsils are absent. He has no overbite.    Chest: Clear to auscultation without wheezing, rhonchi or crackles noted.  Heart: S1+S2+0, regular and normal without murmurs, rubs or gallops noted.   Abdomen: Soft, non-tender and non-distended with normal bowel sounds appreciated on auscultation.  Extremities: There is trace to 1+ pitting edema in the distal lower extremities bilaterally mild erythema is noted distally bilaterally in the legs. Pedal pulses are intact.  Skin: Warm and dry without trophic changes noted. There are no varicose veins.  Musculoskeletal: exam reveals no obvious joint deformities, tenderness or joint swelling or erythema.   Neurologically:  Mental status: The patient is awake, alert and  oriented in all 4 spheres. His immediate and remote memory, attention,  language skills and fund of knowledge are appropriate. There is no evidence of aphasia, agnosia, apraxia or anomia. Speech is clear with normal prosody and enunciation. Thought process is linear. Mood is normal and affect is normal.  Cranial nerves II - XII are as described above under HEENT exam. In addition: shoulder shrug is normal with equal shoulder height noted. Motor exam: Normal bulk, strength and tone is noted. There is no drift, tremor or rebound. Romberg is negative. Reflexes are 2+ throughout. Fine motor skills and coordination: intact with normal finger taps, normal hand movements, normal rapid alternating patting, normal foot taps and normal foot agility.  Cerebellar testing: No dysmetria or intention tremor on finger to nose testing. There is no truncal or gait ataxia.  Sensory exam: intact to light touch, pinprick, vibration, temperature sense in the upper and lower extremities.  Gait, station and balance: He stands easily. No veering to one side is noted. No leaning to one side is noted. Posture is age-appropriate and stance is narrow based. Gait shows normal stride length and normal pace. No problems turning are noted. He turns en bloc. Tandem walk is unremarkable.   Assessment and Plan:   In summary, [name] Page is a very pleasant 69 year old male with an underlying medical history of hyperlipidemia, arthritis, A. fib, status post pacemaker placement, obesity, allergic rhinitis, BPH, who presents for followup consultation of his obstructive sleep apnea. His split-night sleep study in July 2015 showed moderate to severe obstructive sleep apnea. He has done well with CPAP at 9 cm with full compliance and good results. He sleeps well with CPAP, needs heated tubing and changed DME to [facility]. I reviewed his most recent compliance data. He is commended for his superb compliance and he reports using the elyptical machine 5  times a week and plays golf once a week. At this juncture, I would like to see him back in 12 months. He is encouraged to continue to using his machine regularly and he is advised that we will make sure his settings are correct. I answered all his questions today and he was in agreement. I spent 20 minutes in total face-to-face time with the patient, more than 50% of which was spent in counseling and coordination of care, reviewing test results, reviewing medication and discussing or reviewing the diagnosis of OSA, its prognosis and treatment options.

## 2015-07-10 DIAGNOSIS — G4733 Obstructive sleep apnea (adult) (pediatric): Secondary | ICD-10-CM | POA: Diagnosis not present

## 2015-08-02 DIAGNOSIS — G4733 Obstructive sleep apnea (adult) (pediatric): Secondary | ICD-10-CM | POA: Diagnosis not present

## 2015-09-02 DIAGNOSIS — G4733 Obstructive sleep apnea (adult) (pediatric): Secondary | ICD-10-CM | POA: Diagnosis not present

## 2015-09-04 DIAGNOSIS — I4891 Unspecified atrial fibrillation: Secondary | ICD-10-CM | POA: Diagnosis not present

## 2015-09-04 DIAGNOSIS — Z79899 Other long term (current) drug therapy: Secondary | ICD-10-CM | POA: Diagnosis not present

## 2015-09-04 DIAGNOSIS — Z7901 Long term (current) use of anticoagulants: Secondary | ICD-10-CM | POA: Diagnosis not present

## 2015-09-11 ENCOUNTER — Ambulatory Visit (INDEPENDENT_AMBULATORY_CARE_PROVIDER_SITE_OTHER): Payer: Commercial Managed Care - HMO | Admitting: *Deleted

## 2015-09-11 DIAGNOSIS — I442 Atrioventricular block, complete: Secondary | ICD-10-CM | POA: Diagnosis not present

## 2015-09-12 DIAGNOSIS — G4733 Obstructive sleep apnea (adult) (pediatric): Secondary | ICD-10-CM | POA: Diagnosis not present

## 2015-09-13 NOTE — Progress Notes (Signed)
Remote pacemaker transmission.   

## 2015-09-24 LAB — CUP PACEART REMOTE DEVICE CHECK
Battery Remaining Longevity: 32 mo
Battery Voltage: 2.83 V
Implantable Lead Implant Date: 20000217
Implantable Lead Location: 753860
Lead Channel Pacing Threshold Amplitude: 0.625 V
Lead Channel Pacing Threshold Pulse Width: 0.5 ms
Lead Channel Sensing Intrinsic Amplitude: 6.4 mV
Lead Channel Setting Sensing Sensitivity: 4 mV
MDC IDC LEAD IMPLANT DT: 20100414
MDC IDC LEAD LOCATION: 753858
MDC IDC LEAD MODEL: 4196
MDC IDC MSMT BATTERY REMAINING PERCENTAGE: 35 %
MDC IDC MSMT LEADCHNL LV IMPEDANCE VALUE: 510 Ohm
MDC IDC MSMT LEADCHNL RV IMPEDANCE VALUE: 390 Ohm
MDC IDC MSMT LEADCHNL RV PACING THRESHOLD AMPLITUDE: 0.875 V
MDC IDC MSMT LEADCHNL RV PACING THRESHOLD PULSEWIDTH: 0.8 ms
MDC IDC PG SERIAL: 2227415
MDC IDC SESS DTM: 20161129152703
MDC IDC SET LEADCHNL LV PACING AMPLITUDE: 2 V
MDC IDC SET LEADCHNL LV PACING PULSEWIDTH: 0.5 ms
MDC IDC SET LEADCHNL RV PACING AMPLITUDE: 2 V
MDC IDC SET LEADCHNL RV PACING PULSEWIDTH: 0.8 ms

## 2015-09-25 ENCOUNTER — Encounter: Payer: Self-pay | Admitting: Cardiology

## 2015-10-31 DIAGNOSIS — Z6832 Body mass index (BMI) 32.0-32.9, adult: Secondary | ICD-10-CM | POA: Diagnosis not present

## 2015-10-31 DIAGNOSIS — G4730 Sleep apnea, unspecified: Secondary | ICD-10-CM | POA: Diagnosis not present

## 2015-10-31 DIAGNOSIS — E784 Other hyperlipidemia: Secondary | ICD-10-CM | POA: Diagnosis not present

## 2015-10-31 DIAGNOSIS — I4891 Unspecified atrial fibrillation: Secondary | ICD-10-CM | POA: Diagnosis not present

## 2015-10-31 DIAGNOSIS — Z7901 Long term (current) use of anticoagulants: Secondary | ICD-10-CM | POA: Diagnosis not present

## 2015-10-31 DIAGNOSIS — Z1389 Encounter for screening for other disorder: Secondary | ICD-10-CM | POA: Diagnosis not present

## 2015-10-31 DIAGNOSIS — M1990 Unspecified osteoarthritis, unspecified site: Secondary | ICD-10-CM | POA: Diagnosis not present

## 2015-10-31 DIAGNOSIS — N401 Enlarged prostate with lower urinary tract symptoms: Secondary | ICD-10-CM | POA: Diagnosis not present

## 2015-10-31 DIAGNOSIS — I10 Essential (primary) hypertension: Secondary | ICD-10-CM | POA: Diagnosis not present

## 2015-11-13 DIAGNOSIS — H35031 Hypertensive retinopathy, right eye: Secondary | ICD-10-CM | POA: Diagnosis not present

## 2015-11-13 DIAGNOSIS — H353111 Nonexudative age-related macular degeneration, right eye, early dry stage: Secondary | ICD-10-CM | POA: Diagnosis not present

## 2015-11-13 DIAGNOSIS — H25013 Cortical age-related cataract, bilateral: Secondary | ICD-10-CM | POA: Diagnosis not present

## 2015-11-13 DIAGNOSIS — H35032 Hypertensive retinopathy, left eye: Secondary | ICD-10-CM | POA: Diagnosis not present

## 2015-11-13 DIAGNOSIS — H353121 Nonexudative age-related macular degeneration, left eye, early dry stage: Secondary | ICD-10-CM | POA: Diagnosis not present

## 2015-11-13 DIAGNOSIS — H2513 Age-related nuclear cataract, bilateral: Secondary | ICD-10-CM | POA: Diagnosis not present

## 2015-11-13 DIAGNOSIS — H179 Unspecified corneal scar and opacity: Secondary | ICD-10-CM | POA: Diagnosis not present

## 2015-12-11 ENCOUNTER — Ambulatory Visit (INDEPENDENT_AMBULATORY_CARE_PROVIDER_SITE_OTHER): Payer: Commercial Managed Care - HMO | Admitting: *Deleted

## 2015-12-11 DIAGNOSIS — I442 Atrioventricular block, complete: Secondary | ICD-10-CM | POA: Diagnosis not present

## 2015-12-11 NOTE — Progress Notes (Signed)
Remote pacemaker transmission.   

## 2016-01-14 ENCOUNTER — Encounter: Payer: Self-pay | Admitting: Cardiology

## 2016-01-14 LAB — CUP PACEART REMOTE DEVICE CHECK
Battery Remaining Longevity: 18 mo
Battery Remaining Percentage: 22 %
Implantable Lead Implant Date: 20000217
Implantable Lead Implant Date: 20100414
Implantable Lead Location: 753860
Lead Channel Impedance Value: 460 Ohm
Lead Channel Pacing Threshold Amplitude: 0.75 V
Lead Channel Pacing Threshold Pulse Width: 0.5 ms
Lead Channel Sensing Intrinsic Amplitude: 5.2 mV
Lead Channel Setting Pacing Amplitude: 2 V
Lead Channel Setting Sensing Sensitivity: 4 mV
MDC IDC LEAD LOCATION: 753858
MDC IDC LEAD MODEL: 4196
MDC IDC MSMT BATTERY VOLTAGE: 2.78 V
MDC IDC MSMT LEADCHNL RV IMPEDANCE VALUE: 380 Ohm
MDC IDC MSMT LEADCHNL RV PACING THRESHOLD AMPLITUDE: 0.875 V
MDC IDC MSMT LEADCHNL RV PACING THRESHOLD PULSEWIDTH: 0.8 ms
MDC IDC SESS DTM: 20170228075606
MDC IDC SET LEADCHNL LV PACING AMPLITUDE: 2 V
MDC IDC SET LEADCHNL LV PACING PULSEWIDTH: 0.5 ms
MDC IDC SET LEADCHNL RV PACING PULSEWIDTH: 0.8 ms
Pulse Gen Serial Number: 2227415

## 2016-03-11 ENCOUNTER — Ambulatory Visit (INDEPENDENT_AMBULATORY_CARE_PROVIDER_SITE_OTHER): Payer: Commercial Managed Care - HMO | Admitting: *Deleted

## 2016-03-11 DIAGNOSIS — I10 Essential (primary) hypertension: Secondary | ICD-10-CM | POA: Diagnosis not present

## 2016-03-11 DIAGNOSIS — I442 Atrioventricular block, complete: Secondary | ICD-10-CM

## 2016-03-11 DIAGNOSIS — Z6832 Body mass index (BMI) 32.0-32.9, adult: Secondary | ICD-10-CM | POA: Diagnosis not present

## 2016-03-11 DIAGNOSIS — I4891 Unspecified atrial fibrillation: Secondary | ICD-10-CM | POA: Diagnosis not present

## 2016-03-12 NOTE — Progress Notes (Signed)
Remote pacemaker transmission.   

## 2016-03-27 LAB — CUP PACEART REMOTE DEVICE CHECK
Battery Remaining Longevity: 14 mo
Battery Remaining Percentage: 17 %
Battery Voltage: 2.75 V
Implantable Lead Implant Date: 20000217
Implantable Lead Location: 753858
Implantable Lead Location: 753860
Implantable Lead Model: 4196
Lead Channel Impedance Value: 390 Ohm
Lead Channel Impedance Value: 440 Ohm
Lead Channel Pacing Threshold Amplitude: 0.625 V
Lead Channel Pacing Threshold Amplitude: 0.875 V
Lead Channel Pacing Threshold Pulse Width: 0.5 ms
Lead Channel Setting Pacing Pulse Width: 0.5 ms
Lead Channel Setting Pacing Pulse Width: 0.8 ms
Lead Channel Setting Sensing Sensitivity: 4 mV
MDC IDC LEAD IMPLANT DT: 20100414
MDC IDC MSMT LEADCHNL RV PACING THRESHOLD PULSEWIDTH: 0.8 ms
MDC IDC MSMT LEADCHNL RV SENSING INTR AMPL: 8.5 mV
MDC IDC SESS DTM: 20170530142610
MDC IDC SET LEADCHNL LV PACING AMPLITUDE: 2 V
MDC IDC SET LEADCHNL RV PACING AMPLITUDE: 2 V
Pulse Gen Serial Number: 2227415

## 2016-04-01 ENCOUNTER — Encounter: Payer: Self-pay | Admitting: Cardiology

## 2016-04-03 DIAGNOSIS — G4733 Obstructive sleep apnea (adult) (pediatric): Secondary | ICD-10-CM | POA: Diagnosis not present

## 2016-04-29 DIAGNOSIS — Z125 Encounter for screening for malignant neoplasm of prostate: Secondary | ICD-10-CM | POA: Diagnosis not present

## 2016-04-29 DIAGNOSIS — I10 Essential (primary) hypertension: Secondary | ICD-10-CM | POA: Diagnosis not present

## 2016-04-29 DIAGNOSIS — E784 Other hyperlipidemia: Secondary | ICD-10-CM | POA: Diagnosis not present

## 2016-05-05 DIAGNOSIS — Z0000 Encounter for general adult medical examination without abnormal findings: Secondary | ICD-10-CM | POA: Diagnosis not present

## 2016-05-05 DIAGNOSIS — G4730 Sleep apnea, unspecified: Secondary | ICD-10-CM | POA: Diagnosis not present

## 2016-05-05 DIAGNOSIS — E784 Other hyperlipidemia: Secondary | ICD-10-CM | POA: Diagnosis not present

## 2016-05-05 DIAGNOSIS — N401 Enlarged prostate with lower urinary tract symptoms: Secondary | ICD-10-CM | POA: Diagnosis not present

## 2016-05-05 DIAGNOSIS — I10 Essential (primary) hypertension: Secondary | ICD-10-CM | POA: Diagnosis not present

## 2016-05-05 DIAGNOSIS — I4891 Unspecified atrial fibrillation: Secondary | ICD-10-CM | POA: Diagnosis not present

## 2016-05-05 DIAGNOSIS — Z950 Presence of cardiac pacemaker: Secondary | ICD-10-CM | POA: Diagnosis not present

## 2016-05-05 DIAGNOSIS — Z7901 Long term (current) use of anticoagulants: Secondary | ICD-10-CM | POA: Diagnosis not present

## 2016-05-05 DIAGNOSIS — M1990 Unspecified osteoarthritis, unspecified site: Secondary | ICD-10-CM | POA: Diagnosis not present

## 2016-05-16 DIAGNOSIS — G4733 Obstructive sleep apnea (adult) (pediatric): Secondary | ICD-10-CM | POA: Diagnosis not present

## 2016-06-12 ENCOUNTER — Encounter: Payer: Self-pay | Admitting: Internal Medicine

## 2016-06-12 ENCOUNTER — Ambulatory Visit (INDEPENDENT_AMBULATORY_CARE_PROVIDER_SITE_OTHER): Payer: Commercial Managed Care - HMO | Admitting: Internal Medicine

## 2016-06-12 ENCOUNTER — Encounter (INDEPENDENT_AMBULATORY_CARE_PROVIDER_SITE_OTHER): Payer: Self-pay

## 2016-06-12 VITALS — BP 130/82 | HR 70 | Ht 73.0 in | Wt 241.8 lb

## 2016-06-12 DIAGNOSIS — I482 Chronic atrial fibrillation, unspecified: Secondary | ICD-10-CM

## 2016-06-12 DIAGNOSIS — Z950 Presence of cardiac pacemaker: Secondary | ICD-10-CM | POA: Diagnosis not present

## 2016-06-12 LAB — CUP PACEART INCLINIC DEVICE CHECK
Brady Statistic RA Percent Paced: 0 %
Date Time Interrogation Session: 20170831140048
Implantable Lead Implant Date: 20100414
Implantable Lead Location: 753858
Implantable Lead Model: 4196
Lead Channel Impedance Value: 387.5 Ohm
Lead Channel Pacing Threshold Amplitude: 1 V
Lead Channel Pacing Threshold Amplitude: 1 V
Lead Channel Pacing Threshold Pulse Width: 0.5 ms
Lead Channel Pacing Threshold Pulse Width: 0.5 ms
Lead Channel Setting Pacing Amplitude: 2 V
Lead Channel Setting Pacing Amplitude: 2 V
Lead Channel Setting Pacing Pulse Width: 0.5 ms
Lead Channel Setting Pacing Pulse Width: 0.8 ms
MDC IDC LEAD IMPLANT DT: 20000217
MDC IDC LEAD LOCATION: 753860
MDC IDC MSMT BATTERY REMAINING LONGEVITY: 10.9
MDC IDC MSMT BATTERY VOLTAGE: 2.72 V
MDC IDC MSMT LEADCHNL LV IMPEDANCE VALUE: 475 Ohm
MDC IDC MSMT LEADCHNL LV PACING THRESHOLD AMPLITUDE: 1 V
MDC IDC MSMT LEADCHNL RV PACING THRESHOLD AMPLITUDE: 1 V
MDC IDC MSMT LEADCHNL RV PACING THRESHOLD PULSEWIDTH: 0.8 ms
MDC IDC MSMT LEADCHNL RV PACING THRESHOLD PULSEWIDTH: 0.8 ms
MDC IDC SET LEADCHNL RV SENSING SENSITIVITY: 4 mV
MDC IDC STAT BRADY RV PERCENT PACED: 98 %
Pulse Gen Model: 3210
Pulse Gen Serial Number: 2227415

## 2016-06-12 NOTE — Patient Instructions (Addendum)
Medication Instructions:  Your physician recommends that you continue on your current medications as directed. Please refer to the Current Medication list given to you today.   Labwork: None ordered   Testing/Procedures: None ordered   Follow-Up: Your physician wants you to follow-up in: 12 months with Dr Knox Saliva will receive a reminder letter in the mail two months in advance. If you don't receive a letter, please call our office to schedule the follow-up appointment.  Remote monitoring is used to monitor your Pacemaker  from home. This monitoring reduces the number of office visits required to check your device to one time per year. It allows [place] to keep an eye on the functioning of your device to ensure it is working properly. You are scheduled for a device check from home on 09/11/16. You may send your transmission at any time that day. If you have a wireless device, the transmission will be sent automatically. After your physician reviews your transmission, you will receive a postcard with your next transmission date.

## 2016-06-12 NOTE — Progress Notes (Signed)
HPI [name] returns today for followup. He is a pleasant 70 yo man with a h/o HTN, chronic atrial fibrillation, s/p AV node ablation, and PPM. He continues to work full-time. He denies chest pain, shortness of breath, or syncope. He has mild peripheral edema. No Known Allergies   Current Outpatient Prescriptions  Medication Sig Dispense Refill  . carvedilol (COREG) 25 MG tablet Take 25 mg by mouth 2 (two) times daily.      . colchicine 0.6 MG tablet as directed.     Marland Kitchen ELIQUIS 5 MG TABS tablet Take 5 mg by mouth 2 (two) times daily.    . furosemide (LASIX) 40 MG tablet Take 40 mg by mouth daily as needed (swelling).     . hydrochlorothiazide 25 MG tablet Take 25 mg by mouth daily.      Marland Kitchen omeprazole (PRILOSEC) 20 MG capsule as directed.     . potassium chloride SA (K-DUR,KLOR-CON) 20 MEQ tablet Take 20 mEq by mouth daily as needed (When you take Lasix).     . ramipril (ALTACE) 10 MG capsule Take 10 mg by mouth 2 (two) times daily.      . rosuvastatin (CRESTOR) 40 MG tablet Take 20 mg by mouth daily.      . traMADol (ULTRAM) 50 MG tablet Take 50 mg by mouth as directed. Take as needed for pain     No current facility-administered medications for this visit.      Past Medical History:  Diagnosis Date  . Anal fissure   . Anal fistula   . Atrial fibrillation ([place])   . Blood transfusion without reported diagnosis   . Cardiomyopathy    Nonishemic, possibly tachycardia-mediated. 1997 LHC with no significant CAD. Myoview (7/09) showed EF 44% with possible mild inferior ischemia. Echo (9/10): EF 60%  . Cataract   . CHF (congestive heart failure) ([place])   . Chronic atrial fibrillation ([facility])    s/p AV nodal ablation and on Coumadin. Patient developed dyspnea/fatigue with RV  pacing and an LV lead was placed  . Colon polyp    hyperplastic and adenomatous  . HTN (hypertension)   . Hyperlipidemia   . Hyperplastic colon polyp    and adenomatous  . Osteoarthritis of right shoulder region   .  Pacemaker    St. Jude CRT-P device  . Pseudogout   . Sleep apnea   . Upper GI bleed    With duodenal ulcer in 2005.  Patient was on ASA and coumadin at that time. ASA was stopped..    ROS:   All systems reviewed and negative except as noted in the HPI.   Past Surgical History:  Procedure Laterality Date  . PACEMAKER INSERTION     St. Jude's  . SHOULDER SURGERY     Right  . TREATMENT FISTULA ANAL       Family History  Problem Relation Age of Onset  . Heart disease Mother   . Stroke Father   . Diverticulitis Sister   . Colon cancer Neg Hx      Social History   Social History  . Marital status: Married    Spouse name: [name] Page  . Number of children: 0  . Years of education: some coll.   Occupational History  . SALES Engineer, maintenance (IT)   Social History Main Topics  . Smoking status: Never Smoker  . Smokeless tobacco: Never Used  . Alcohol use No     Comment: Very rare  . Drug use: No  .  Sexual activity: Not on file   Other Topics Concern  . Not on file   Social History Narrative   Lives in [place].   Does a lot of traveling.   Exercises daily.   Daily Caffeine use.   Patient is right handed     BP 130/82   Pulse 70   Ht 6\' 1"  (1.854 m)   Wt 241 lb 12.8 oz (109.7 kg)   BMI 31.90 kg/m   Physical Exam:  Well appearing middle-aged man, NAD HEENT: Unremarkable Neck:  7 cm JVD, no thyromegally Back:  No CVA tenderness Lungs:  Clear with no wheezes, rales, or rhonchi. Well-healed pacemaker incision. HEART:  Regular rate rhythm, no murmurs, no rubs, no clicks Abd:  soft, positive bowel sounds, no organomegally, no rebound, no guarding Ext:  2 plus pulses, no edema, no cyanosis, no clubbing Skin:  No rashes no nodules Neuro:  CN II through XII intact, motor grossly intact  EKG -atrial fibrillation with biventricular pacing  DEVICE  Normal device function.  See PaceArt for details.   Assess/Plan: 1. Chronic atrial fib - he is doing well,  s/p AV node ablation. His rate is controlled. He will continue his current meds. 2. HTN - his blood pressure is controlled. Will continue his current meds. 3. PPM - his St. Jude BiV PM is working normally. Will follow. He is approaching (less than a year) ERI.  [name] Page.D.

## 2016-07-07 ENCOUNTER — Encounter: Payer: Self-pay | Admitting: Neurology

## 2016-07-07 ENCOUNTER — Ambulatory Visit (INDEPENDENT_AMBULATORY_CARE_PROVIDER_SITE_OTHER): Payer: Commercial Managed Care - HMO | Admitting: Neurology

## 2016-07-07 VITALS — BP 138/76 | HR 80 | Resp 18 | Ht 73.0 in | Wt 241.0 lb

## 2016-07-07 DIAGNOSIS — R600 Localized edema: Secondary | ICD-10-CM

## 2016-07-07 DIAGNOSIS — G4733 Obstructive sleep apnea (adult) (pediatric): Secondary | ICD-10-CM | POA: Diagnosis not present

## 2016-07-07 DIAGNOSIS — I4891 Unspecified atrial fibrillation: Secondary | ICD-10-CM

## 2016-07-07 DIAGNOSIS — Z9989 Dependence on other enabling machines and devices: Principal | ICD-10-CM

## 2016-07-07 DIAGNOSIS — Z950 Presence of cardiac pacemaker: Secondary | ICD-10-CM | POA: Diagnosis not present

## 2016-07-07 NOTE — Patient Instructions (Signed)
Please continue using your CPAP regularly. While your insurance requires that you use CPAP at least 4 hours each night on 70% of the nights, I recommend, that you not skip any nights and use it throughout the night if you can. Getting used to CPAP and staying with the treatment long term does take time and patience and discipline. Untreated obstructive sleep apnea when it is moderate to severe can have an adverse impact on cardiovascular health and raise her risk for heart disease, arrhythmias, hypertension, congestive heart failure, stroke and diabetes. Untreated obstructive sleep apnea causes sleep disruption, nonrestorative sleep, and sleep deprivation. This can have an impact on your day to day functioning and cause daytime sleepiness and impairment of cognitive function, memory loss, mood disturbance, and problems focussing. Using CPAP regularly can improve these symptoms.  Keep up the good work! I will see you back in one year! 

## 2016-07-07 NOTE — Progress Notes (Signed)
Subjective:    Patient ID: [name] Page is a 70 y.o. male.  HPI     Interim history:   Mr. Carlisi is a very pleasant 70 year old right-handed gentleman with an underlying medical history of hyperlipidemia, arthritis, A. fib, s/p ablation in 2000, status post pacemaker placement, obesity, allergic rhinitis, BPH, who presents for followup consultation of his obstructive sleep apnea, on treatment with CPAP therapy. The patient is unaccompanied today. I last saw him on 07/05/2015, at which time he reported doing well. He was fully compliant with CPAP therapy and his exam was stable. He had stopped taking Coumadin and was on Eliquis. He requested heated tubing for his CPAP machine but otherwise left his CPAP. I suggested a one-year checkup.   Today, 07/07/2016: I reviewed his CPAP compliance data from 06/03/2016 through 07/02/2016, which is a total of 30 days, during which time he used his machine every night with percent used days greater than 4 hours at 100%, indicating superb compliance with an average usage of 7 hours and 25 minutes, residual AHI low at 0.7 per hour, leak acceptable with the 95th percentile at 12.5 L/m on a pressure of 9 cm with EPR of 2.  Today, 07/07/2016: He reports doing well with CPAP therapy. He had a recent cardiology appointment with Dr. Lovena Le on 06/12/2016 and I reviewed the office note. He will need a PM battery change in about 10 months. He uses the nasal pillows, has adjusted well. He enjoys playing golf. Denies RLS Sx. takes furosemide as needed, depending on the degree of lower extremity swelling and weight changes.  Previously:   I saw him on 01/02/2015, at which time he reported treatment compliance with CPAP. He changed from advanced home care to [place]. He retired in October 2015 and has been playing more golf. He uses his elliptical daily for about 30 minutes.   I reviewed his CPAP compliance data from 06/04/2015 through 07/03/2015 which is a total of 30 days  during which time he used his machine every night with percent used days greater than 4 hours at 100%, indicating superb compliance with an average usage of 7 hours and 13 minutes, residual AHI low at 0.9 per hour, leak acceptable with the 95th percentile at 14.8 L/m on a pressure of 9 cm with EPR of 2.   I saw him on 06/29/2014, at which time he reported doing much better. He felt, he was sleeping better, his wife was pleased with how he was sleeping, his daytime somnolence and sleep consolidation were improved. He denied any restless leg symptoms and was not bothered by any leg movements or twitching at night. Overall he had done really well and was very pleased. He was completely compliant with treatment and had no issues adjusting to the machine and the mask. He was using nasal pillows and liked them.   I reviewed his CPAP compliance data from 12/02/2014 through 12/31/2014 which is a total of 30 days during which time he used his machine 28 days with percent used days greater than 4 hours of only 50%, indicating suboptimal compliance with an average usage of 3 hours and 59 minutes. 95th percentile of pressure was 10.2. Leak acceptable with the 95th percentile of leak at 12.9 L/m. Residual AHI at 3.8 per hour.    I first met him on 04/04/2014 at the request of his primary care physician, at which time the patient reported snoring, significant daytime somnolence and witnessed apneas per wife. I invited him back  for sleep study. He had a split-night sleep study on 04/19/2014, and I went over his test results with him in detail today. Baseline sleep efficiency was significantly reduced at 53.9% with a long latency to sleep of 39.5 minutes and wake after sleep onset of 67.5 minutes with moderate sleep fragmentation noted. He had an elevated arousal index. He had an increased percentage of stage I and stage II sleep as well as absence of slow-wave and REM sleep. He had no significant periodic leg movements and  EKG showed known A. fib. He had a paced cardiac rhythm. His total AHI was elevated at 25 per hour. Baseline oxygen saturation was 93%, nadir was 73%. He was then titrated on CPAP. Sleep efficiency was 67%. He had a absence of deep sleep but increased percentage of dream sleep at 47.1% after CPAP. His average oxygen saturation was 93% with a nadir of 89%. He was titrated from 5-9 cm with a reduction of his AHI to 0 events per hour at the final pressure with supine REM sleep achieved. He had severe periodic leg movements during the treatment portion of the study but no significant arousals. Based on the test results I prescribed CPAP for him.   I reviewed the patient's CPAP compliance data from 05/16/2014 to 06/14/2014, which is a total of 30 days, during which time the patient used CPAP every day. The average usage for all days was 7 hours and 23 minutes. The percent used days greater than 4 hours was 100 %, indicating superb compliance. The residual AHI was 0.6 per hour, indicating an appropriate treatment pressure of 9 cwp with EPR of 2. Air leak from the mask was very low at 3 L per minute at the 95th percentile.  I reviewed his compliance data from 05/29/2014 through 06/27/2014 which is a total of 30 days during which time he used his machine every night with percent used days greater than 4 hours of 100%, again indicating flawless compliance. His average usage was 7 hours and 25 minutes. His residual AHI low at 0.3 with leak very low at 0.8 at the current pressure of 9 cm with EPR of 2.  His Past Medical History Is Significant For: Past Medical History:  Diagnosis Date  . Anal fissure   . Anal fistula   . Atrial fibrillation ([place])   . Blood transfusion without reported diagnosis   . Cardiomyopathy    Nonishemic, possibly tachycardia-mediated. 1997 LHC with no significant CAD. Myoview (7/09) showed EF 44% with possible mild inferior ischemia. Echo (9/10): EF 60%  . Cataract   . CHF (congestive  heart failure) ([place])   . Chronic atrial fibrillation (HCC)    s/p AV nodal ablation and on Coumadin. Patient developed dyspnea/fatigue with RV  pacing and an LV lead was placed  . Colon polyp    hyperplastic and adenomatous  . HTN (hypertension)   . Hyperlipidemia   . Hyperplastic colon polyp    and adenomatous  . Osteoarthritis of right shoulder region   . Pacemaker    St. Jude CRT-P device  . Pseudogout   . Sleep apnea   . Upper GI bleed    With duodenal ulcer in 2005.  Patient was on ASA and coumadin at that time. ASA was stopped.Marland Kitchen    His Past Surgical History Is Significant For: Past Surgical History:  Procedure Laterality Date  . PACEMAKER INSERTION     St. Jude's  . SHOULDER SURGERY     Right  .  TREATMENT FISTULA ANAL      His Family History Is Significant For: Family History  Problem Relation Age of Onset  . Heart disease Mother   . Stroke Father   . Diverticulitis Sister   . Colon cancer Neg Hx     His Social History Is Significant For: Social History   Social History  . Marital status: Married    Spouse name: Di Kindle  . Number of children: 0  . Years of education: some coll.   Occupational History  . SALES Engineer, maintenance ([employer])   Social History Main Topics  . Smoking status: Never Smoker  . Smokeless tobacco: Never Used  . Alcohol use No     Comment: Very rare  . Drug use: No  . Sexual activity: Not Asked   Other Topics Concern  . None   Social History Narrative   Lives in [place].   Does a lot of traveling.   Exercises daily.   Daily Caffeine use.   Patient is right handed    His Allergies Are:  No Known Allergies:   His Current Medications Are:  Outpatient Encounter Prescriptions as of 07/07/2016  Medication Sig  . carvedilol (COREG) 25 MG tablet Take 25 mg by mouth 2 (two) times daily.    . colchicine 0.6 MG tablet as directed.   Marland Kitchen ELIQUIS 5 MG TABS tablet Take 5 mg by mouth 2 (two) times daily.  . furosemide (LASIX) 40 MG  tablet Take 40 mg by mouth daily as needed (swelling).   . hydrochlorothiazide 25 MG tablet Take 25 mg by mouth daily.    Marland Kitchen omeprazole (PRILOSEC) 20 MG capsule as directed.   . potassium chloride SA (K-DUR,KLOR-CON) 20 MEQ tablet Take 20 mEq by mouth daily as needed (When you take Lasix).   . ramipril (ALTACE) 10 MG capsule Take 10 mg by mouth 2 (two) times daily.    . rosuvastatin (CRESTOR) 40 MG tablet Take 20 mg by mouth daily.    . traMADol (ULTRAM) 50 MG tablet Take 50 mg by mouth as directed. Take as needed for pain   No facility-administered encounter medications on file as of 07/07/2016.   :  Review of Systems:  Out of a complete 14 point review of systems, all are reviewed and negative with the exception of these symptoms as listed below: Review of Systems  Neurological:       Patient states that he is doing well with CPAP. No new concerns.     Objective:  Neurologic Exam  Physical Exam Physical Examination:   Vitals:   07/07/16 1301  BP: 138/76  Pulse: 80  Resp: 18   General Examination: The patient is a very pleasant 70 y.o. male in no acute distress. He appears well-developed and well-nourished and well groomed. He has lost about 5 lb.   HEENT: Normocephalic, atraumatic, pupils are equal, round and reactive to light and accommodation. Funduscopic exam is normal with sharp disc margins noted. Extraocular tracking is good without limitation to gaze excursion or nystagmus noted. Normal smooth pursuit is noted. Hearing is grossly intact. Face is symmetric with normal facial animation and normal facial sensation. Speech is clear with no dysarthria noted. There is no hypophonia. There is no lip, neck/head, jaw or voice tremor. Neck is supple with full range of passive and active motion. There are no carotid bruits on auscultation. Oropharynx exam reveals: mild mouth dryness, adequate dental hygiene and moderate airway crowding, due to narrow airway entry and redundant soft  palate  and larger uvula and wider tongue base. Mallampati is class II. Tongue protrudes centrally and palate elevates symmetrically. Tonsils are absent. He has no overbite.    Chest: Clear to auscultation without wheezing, rhonchi or crackles noted.  Heart: S1+S2+0, regular and normal without murmurs, rubs or gallops noted.   Abdomen: Soft, non-tender and non-distended with normal bowel sounds appreciated on auscultation.  Extremities: There is 1+ pitting edema in the distal lower extremities bilaterally mild erythema is noted distally bilaterally in the legs, R more than L. Pedal pulses are intact.  Skin: Warm and dry without trophic changes noted, chronic appearing changes distal legs. There are no varicose veins.  Musculoskeletal: exam reveals no obvious joint deformities, tenderness or joint swelling or erythema.   Neurologically:  Mental status: The patient is awake, alert and oriented in all 4 spheres. His immediate and remote memory, attention, language skills and fund of knowledge are appropriate. There is no evidence of aphasia, agnosia, apraxia or anomia. Speech is clear with normal prosody and enunciation. Thought process is linear. Mood is normal and affect is normal.  Cranial nerves II - XII are as described above under HEENT exam. In addition: shoulder shrug is normal with equal shoulder height noted. Motor exam: Normal bulk, strength and tone is noted. There is no drift, tremor or rebound. Romberg is negative. Reflexes are 2+ throughout. Fine motor skills and coordination: intact with normal finger taps, normal hand movements, normal rapid alternating patting, normal foot taps and normal foot agility.  Cerebellar testing: No dysmetria or intention tremor on finger to nose testing. There is no truncal or gait ataxia.  Sensory exam: intact to light touch, pinprick, vibration, temperature sense in the upper and lower extremities.  Gait, station and balance: He stands easily. No veering to  one side is noted. No leaning to one side is noted. Posture is age-appropriate and stance is narrow based. Gait shows normal stride length and normal pace. No problems turning are noted. Tandem walk is unremarkable.   Assessment and Plan:   In summary, ROBBEN JAGIELLO is a very pleasant 70 year old male with an underlying medical history of hyperlipidemia, arthritis, A. fib, status post pacemaker placement, obesity, allergic rhinitis, BPH, who presents for followup consultation of his obstructive sleep apnea, established on CPAP of 9 cm. He had a split-night sleep study on 04/19/14, which showed moderate to severe obstructive sleep apnea. He has done well with CPAP at 9 cm with full compliance and ongoing good results. He sleeps well with CPAP, DME is [facility]. Physical exam is stable, he has lost about 5 pounds since I saw him last year. He is commended for his outstanding CPAP compliance, and also commended for his physical activity and weight loss endeavors. He is reminded to try to scale back on his ice tea intake and increase his water intake, monitor his lower extremity swelling.  I would like to see him back in 12 months. He is encouraged to continue to using his machine regularly. I placed an order for CPAP supplies. I answered all his questions today and he was in agreement. I spent 25 minutes in total face-to-face time with the patient, more than 50% of which was spent in counseling and coordination of care, reviewing test results, reviewing medication and discussing or reviewing the diagnosis of OSA, its prognosis and treatment options.

## 2016-07-10 DIAGNOSIS — G4733 Obstructive sleep apnea (adult) (pediatric): Secondary | ICD-10-CM | POA: Diagnosis not present

## 2016-09-11 ENCOUNTER — Ambulatory Visit (INDEPENDENT_AMBULATORY_CARE_PROVIDER_SITE_OTHER): Payer: Commercial Managed Care - HMO | Admitting: *Deleted

## 2016-09-11 DIAGNOSIS — I4891 Unspecified atrial fibrillation: Secondary | ICD-10-CM | POA: Diagnosis not present

## 2016-09-11 DIAGNOSIS — I442 Atrioventricular block, complete: Secondary | ICD-10-CM

## 2016-09-11 DIAGNOSIS — Z23 Encounter for immunization: Secondary | ICD-10-CM | POA: Diagnosis not present

## 2016-09-11 DIAGNOSIS — Z79899 Other long term (current) drug therapy: Secondary | ICD-10-CM | POA: Diagnosis not present

## 2016-09-11 NOTE — Progress Notes (Signed)
Remote pacemaker transmission.   

## 2016-09-23 ENCOUNTER — Encounter: Payer: Self-pay | Admitting: Cardiology

## 2016-10-22 LAB — CUP PACEART REMOTE DEVICE CHECK
Battery Voltage: 2.66 V
Date Time Interrogation Session: 20171130072511
Implantable Lead Implant Date: 20000217
Implantable Lead Location: 753858
Implantable Lead Model: 4196
Implantable Pulse Generator Implant Date: 20100414
Lead Channel Impedance Value: 390 Ohm
Lead Channel Pacing Threshold Pulse Width: 0.8 ms
Lead Channel Sensing Intrinsic Amplitude: 6.9 mV
Lead Channel Setting Pacing Amplitude: 2 V
Lead Channel Setting Pacing Amplitude: 2 V
Lead Channel Setting Pacing Pulse Width: 0.8 ms
MDC IDC LEAD IMPLANT DT: 20100414
MDC IDC LEAD LOCATION: 753860
MDC IDC MSMT BATTERY REMAINING LONGEVITY: 4 mo
MDC IDC MSMT BATTERY REMAINING PERCENTAGE: 5 %
MDC IDC MSMT LEADCHNL LV IMPEDANCE VALUE: 540 Ohm
MDC IDC MSMT LEADCHNL LV PACING THRESHOLD AMPLITUDE: 0.5 V
MDC IDC MSMT LEADCHNL LV PACING THRESHOLD PULSEWIDTH: 0.5 ms
MDC IDC MSMT LEADCHNL RV PACING THRESHOLD AMPLITUDE: 0.875 V
MDC IDC SET LEADCHNL LV PACING PULSEWIDTH: 0.5 ms
MDC IDC SET LEADCHNL RV SENSING SENSITIVITY: 4 mV
Pulse Gen Model: 3210
Pulse Gen Serial Number: 2227415

## 2016-10-27 DIAGNOSIS — R69 Illness, unspecified: Secondary | ICD-10-CM | POA: Diagnosis not present

## 2016-11-06 ENCOUNTER — Ambulatory Visit (INDEPENDENT_AMBULATORY_CARE_PROVIDER_SITE_OTHER): Payer: Medicare HMO | Admitting: *Deleted

## 2016-11-06 DIAGNOSIS — Z950 Presence of cardiac pacemaker: Secondary | ICD-10-CM

## 2016-11-06 LAB — CUP PACEART REMOTE DEVICE CHECK
Battery Voltage: 2.63 V
Implantable Lead Implant Date: 20100414
Implantable Lead Location: 753858
Implantable Lead Model: 4196
Lead Channel Impedance Value: 560 Ohm
Lead Channel Pacing Threshold Amplitude: 0.625 V
Lead Channel Pacing Threshold Amplitude: 0.875 V
Lead Channel Pacing Threshold Pulse Width: 0.5 ms
Lead Channel Sensing Intrinsic Amplitude: 6.9 mV
Lead Channel Setting Pacing Amplitude: 2 V
Lead Channel Setting Pacing Pulse Width: 0.5 ms
Lead Channel Setting Pacing Pulse Width: 0.8 ms
Lead Channel Setting Sensing Sensitivity: 4 mV
MDC IDC LEAD IMPLANT DT: 20000217
MDC IDC LEAD LOCATION: 753860
MDC IDC MSMT BATTERY REMAINING LONGEVITY: 1 mo
MDC IDC MSMT BATTERY REMAINING PERCENTAGE: 1 %
MDC IDC MSMT LEADCHNL RV IMPEDANCE VALUE: 410 Ohm
MDC IDC MSMT LEADCHNL RV PACING THRESHOLD PULSEWIDTH: 0.8 ms
MDC IDC PG IMPLANT DT: 20100414
MDC IDC PG SERIAL: 2227415
MDC IDC SESS DTM: 20180125074345
MDC IDC SET LEADCHNL LV PACING AMPLITUDE: 2 V

## 2016-11-06 NOTE — Progress Notes (Signed)
Remote pacemaker transmission.   

## 2016-11-07 ENCOUNTER — Encounter: Payer: Self-pay | Admitting: Cardiology

## 2016-11-07 NOTE — Progress Notes (Signed)
Letter  

## 2016-12-01 DIAGNOSIS — H353131 Nonexudative age-related macular degeneration, bilateral, early dry stage: Secondary | ICD-10-CM | POA: Diagnosis not present

## 2016-12-01 DIAGNOSIS — H25013 Cortical age-related cataract, bilateral: Secondary | ICD-10-CM | POA: Diagnosis not present

## 2016-12-01 DIAGNOSIS — H35033 Hypertensive retinopathy, bilateral: Secondary | ICD-10-CM | POA: Diagnosis not present

## 2016-12-01 DIAGNOSIS — H2513 Age-related nuclear cataract, bilateral: Secondary | ICD-10-CM | POA: Diagnosis not present

## 2016-12-11 ENCOUNTER — Ambulatory Visit (INDEPENDENT_AMBULATORY_CARE_PROVIDER_SITE_OTHER): Payer: Medicare HMO | Admitting: *Deleted

## 2016-12-11 DIAGNOSIS — I442 Atrioventricular block, complete: Secondary | ICD-10-CM

## 2016-12-11 NOTE — Progress Notes (Signed)
Remote pacemaker transmission.   

## 2016-12-16 ENCOUNTER — Encounter: Payer: Self-pay | Admitting: Cardiology

## 2016-12-16 DIAGNOSIS — M25561 Pain in right knee: Secondary | ICD-10-CM | POA: Diagnosis not present

## 2016-12-16 DIAGNOSIS — M1990 Unspecified osteoarthritis, unspecified site: Secondary | ICD-10-CM | POA: Diagnosis not present

## 2016-12-16 DIAGNOSIS — I4891 Unspecified atrial fibrillation: Secondary | ICD-10-CM | POA: Diagnosis not present

## 2016-12-16 DIAGNOSIS — Z6832 Body mass index (BMI) 32.0-32.9, adult: Secondary | ICD-10-CM | POA: Diagnosis not present

## 2016-12-16 DIAGNOSIS — I10 Essential (primary) hypertension: Secondary | ICD-10-CM | POA: Diagnosis not present

## 2016-12-16 LAB — CUP PACEART REMOTE DEVICE CHECK
Implantable Lead Location: 753858
Implantable Pulse Generator Implant Date: 20100414
Lead Channel Impedance Value: 400 Ohm
Lead Channel Pacing Threshold Pulse Width: 0.8 ms
Lead Channel Setting Pacing Amplitude: 2 V
Lead Channel Setting Pacing Amplitude: 2 V
Lead Channel Setting Pacing Pulse Width: 0.5 ms
Lead Channel Setting Pacing Pulse Width: 0.8 ms
MDC IDC LEAD IMPLANT DT: 20000217
MDC IDC LEAD IMPLANT DT: 20100414
MDC IDC LEAD LOCATION: 753860
MDC IDC MSMT BATTERY REMAINING LONGEVITY: 1 mo
MDC IDC MSMT BATTERY REMAINING PERCENTAGE: 0.5 %
MDC IDC MSMT BATTERY VOLTAGE: 2.62 V
MDC IDC MSMT LEADCHNL LV IMPEDANCE VALUE: 460 Ohm
MDC IDC MSMT LEADCHNL LV PACING THRESHOLD AMPLITUDE: 0.625 V
MDC IDC MSMT LEADCHNL LV PACING THRESHOLD PULSEWIDTH: 0.5 ms
MDC IDC MSMT LEADCHNL RV PACING THRESHOLD AMPLITUDE: 0.875 V
MDC IDC MSMT LEADCHNL RV SENSING INTR AMPL: 6.9 mV
MDC IDC SESS DTM: 20180301075536
MDC IDC SET LEADCHNL RV SENSING SENSITIVITY: 4 mV
Pulse Gen Model: 3210
Pulse Gen Serial Number: 2227415

## 2016-12-17 ENCOUNTER — Telehealth: Payer: Self-pay | Admitting: Cardiology

## 2016-12-17 NOTE — Telephone Encounter (Signed)
Spoke w/ pt and informed him that his device has reached ERI. Informed him that a scheduler will call him to schedule an appt w/ MD / NP / PA. Pt verbalized understanding.

## 2016-12-25 NOTE — Progress Notes (Signed)
Cardiology Office Note Date:  12/26/2016  Patient ID:  [name] Page, [name] Page 06-Feb-1946, MRN [id number] PCP:  Geoffery Lyons, MD  Cardiologist:  Dr. Caryl Comes >> Dr. Lovena Le secondary to insurance, but would like to resume management with Dr. Caryl Comes only because he was his initial MD   Chief Complaint: PPM reached ERI  History of Present Illness: [name] Page is a 71 y.o. male with history of permanent AFib w/AV node ablation and CRT-P, HTN, HLD, remote NICM felt to have been secondary to tacycardia with improved LVEF.  He comes today to be seen for Dr. Lovena Le, last seen by him in August, at that time doing well.  He comes today accompanied by his wife, he is doing well, knew exactly when his battery reached ERI with a decrease in his exertional capacity, otherwise no CP, palpitations or SOB, no dizziness,near syncope or syncope.  He denies any bleeding or signs of bleeding.   Device information: SJM CRT-P LV lead/upgrade 01/24/09, original device 2000, Dr. Lovena Le AF Hx Permanent with AV node ablation GIB on ASA w/ warfarin (ASA stopped)   Past Medical History:  Diagnosis Date  . Anal fissure   . Anal fistula   . Atrial fibrillation ([place])   . Blood transfusion without reported diagnosis   . Cardiomyopathy    Nonishemic, possibly tachycardia-mediated. 1997 LHC with no significant CAD. Myoview (7/09) showed EF 44% with possible mild inferior ischemia. Echo (9/10): EF 60%  . Cataract   . CHF (congestive heart failure) ([place])   . Chronic atrial fibrillation (HCC)    s/p AV nodal ablation and on Coumadin. Patient developed dyspnea/fatigue with RV  pacing and an LV lead was placed  . Colon polyp    hyperplastic and adenomatous  . HTN (hypertension)   . Hyperlipidemia   . Hyperplastic colon polyp    and adenomatous  . Osteoarthritis of right shoulder region   . Pacemaker    St. Jude CRT-P device  . Pseudogout   . Sleep apnea   . Upper GI bleed    With duodenal ulcer in 2005.   Patient was on ASA and coumadin at that time. ASA was stopped..    Past Surgical History:  Procedure Laterality Date  . PACEMAKER INSERTION     St. Jude's  . SHOULDER SURGERY     Right  . TREATMENT FISTULA ANAL      Current Outpatient Prescriptions  Medication Sig Dispense Refill  . carvedilol (COREG) 25 MG tablet Take 25 mg by mouth 2 (two) times daily.      . colchicine 0.6 MG tablet as directed.     Marland Kitchen ELIQUIS 5 MG TABS tablet Take 5 mg by mouth 2 (two) times daily.    . furosemide (LASIX) 40 MG tablet Take 40 mg by mouth daily as needed (swelling).     . hydrochlorothiazide 25 MG tablet Take 25 mg by mouth daily.      Marland Kitchen omeprazole (PRILOSEC) 20 MG capsule as directed.     . potassium chloride SA (K-DUR,KLOR-CON) 20 MEQ tablet Take 20 mEq by mouth daily as needed (When you take Lasix).     . ramipril (ALTACE) 10 MG capsule Take 10 mg by mouth 2 (two) times daily.      . rosuvastatin (CRESTOR) 40 MG tablet Take 20 mg by mouth daily.      . traMADol (ULTRAM) 50 MG tablet Take 50 mg by mouth as directed. Take as needed for pain  No current facility-administered medications for this visit.     Allergies:   Patient has no known allergies.   Social History:  The patient  reports that he has never smoked. He has never used smokeless tobacco. He reports that he does not drink alcohol or use drugs.   Family History:  The patient's family history includes Diverticulitis in his sister; Heart disease in his mother; Stroke in his father.  ROS:  Please see the history of present illness. All other systems are reviewed and otherwise negative.   PHYSICAL EXAM:  VS:  BP 134/78   Pulse 63   Ht 6\' 1"  (1.854 m)   Wt 246 lb (111.6 kg)   BMI 32.46 kg/m  BMI: Body mass index is 32.46 kg/m. Well nourished, well developed, in no acute distress  HEENT: normocephalic, atraumatic  Neck: no JVD, carotid bruits or masses Cardiac:  RRR (paced); no significant murmurs, no rubs, or  gallops Lungs:  CTA b/l, no wheezing, rhonchi or rales  Abd: soft, nontender MS: no deformity or atrophy Ext:  no edema  Skin: warm and dry, no rash Neuro:  No gross deficits appreciated Psych: euthymic mood, full affect  PPM site is stable, no tethering or discomfort   EKG:  Done today shows V paced rhythm Device interrogation done today by industry and reviewed by myself: battery reached ERI 12/17/16 and reverted to VVI (from VVIR), lead measurements are stable, no other observations, device was reprogrammed to VVIR given patients symptoms without rate response  06/28/09: TTE Study Conclusions 1. Left ventricle: The cavity size was normal. Wall thickness was  normal. The estimated ejection fraction was 60%. Wall motion was  normal; there were no regional wall motion abnormalities. 2. Mitral valve: Mildly calcified annulus. 3. Left atrium: The atrium was mildly to moderately dilated. 4. Right ventricle: The cavity size was mildly dilated. Systolic  function was normal. 5. Pulmonary arteries: PA peak pressure: 67mm Hg (S).  Recent Labs: No results found for requested labs within last 8760 hours.  No results found for requested labs within last 8760 hours.   CrCl cannot be calculated (Patient's most recent lab result is older than the maximum 21 days allowed.).   Wt Readings from Last 3 Encounters:  12/26/16 246 lb (111.6 kg)  07/07/16 241 lb (109.3 kg)  06/12/16 241 lb 12.8 oz (109.7 kg)     Other studies reviewed: Additional studies/records reviewed today include: summarized above  ASSESSMENT AND PLAN:  1. Permanent AFib     CHA2Ds2Vasc is 2, on Warfarin  2. AV node ablation with CRT-P    device has reached ERI    risks/benefits discussed for generator change procedure, he is agreeable to proceed    He was educated by industry that he may again revert to VVI programming prior to his gen change, the patient would like to go ahead and wait until the next  available with Dr. Caryl Comes, will be early April  3. HTN     Looks good, no changes  Disposition: F/u with routine post gen change follow up, hold Eliquis AM of his procedure.  Current medicines are reviewed at length with the patient today.  The patient did not have any concerns regarding medicines.  Haywood Lasso, PA-C 12/26/2016 6:20 PM     [address] [place] [address] [phone] (office)  [phone] (fax)

## 2016-12-26 ENCOUNTER — Ambulatory Visit (INDEPENDENT_AMBULATORY_CARE_PROVIDER_SITE_OTHER): Payer: Medicare HMO | Admitting: Physician Assistant

## 2016-12-26 ENCOUNTER — Encounter: Payer: Self-pay | Admitting: *Deleted

## 2016-12-26 VITALS — BP 134/78 | HR 63 | Ht 73.0 in | Wt 246.0 lb

## 2016-12-26 DIAGNOSIS — I5022 Chronic systolic (congestive) heart failure: Secondary | ICD-10-CM | POA: Diagnosis not present

## 2016-12-26 DIAGNOSIS — I482 Chronic atrial fibrillation: Secondary | ICD-10-CM | POA: Diagnosis not present

## 2016-12-26 DIAGNOSIS — I4821 Permanent atrial fibrillation: Secondary | ICD-10-CM

## 2016-12-26 DIAGNOSIS — Z45010 Encounter for checking and testing of cardiac pacemaker pulse generator [battery]: Secondary | ICD-10-CM | POA: Diagnosis not present

## 2016-12-26 DIAGNOSIS — Z01818 Encounter for other preprocedural examination: Secondary | ICD-10-CM

## 2016-12-26 DIAGNOSIS — I10 Essential (primary) hypertension: Secondary | ICD-10-CM | POA: Diagnosis not present

## 2016-12-26 NOTE — Patient Instructions (Addendum)
  Medication Instructions:   Your physician recommends that you continue on your current medications as directed. Please refer to the Current Medication list given to you today.   If you need a refill on your cardiac medications before your next appointment, please call your pharmacy.  Labwork:  Return on 01-21-2017  FOR CBC AND BMET    Testing/Procedures: SEE LETTER FOR GEN CHANGE ON 01-28-2017   Follow-Up: AFTER 01-28-2017    10 DAY  WOUND CHECK     51 DAY PHYS  PACER CHK  WITH DR Caryl Comes     Any Other Special Instructions Will Be Listed Below (If Applicable).

## 2016-12-27 DIAGNOSIS — M25561 Pain in right knee: Secondary | ICD-10-CM | POA: Diagnosis not present

## 2017-01-08 DIAGNOSIS — G4733 Obstructive sleep apnea (adult) (pediatric): Secondary | ICD-10-CM | POA: Diagnosis not present

## 2017-01-12 ENCOUNTER — Ambulatory Visit: Payer: Medicare HMO | Admitting: *Deleted

## 2017-01-12 NOTE — Progress Notes (Signed)
Remote pacemaker transmission.   

## 2017-01-21 ENCOUNTER — Other Ambulatory Visit: Payer: Medicare HMO | Admitting: *Deleted

## 2017-01-21 DIAGNOSIS — Z01818 Encounter for other preprocedural examination: Secondary | ICD-10-CM | POA: Diagnosis not present

## 2017-01-22 LAB — BASIC METABOLIC PANEL
BUN / CREAT RATIO: 16 (ref 10–24)
BUN: 13 mg/dL (ref 8–27)
CO2: 28 mmol/L (ref 18–29)
Calcium: 9.5 mg/dL (ref 8.6–10.2)
Chloride: 101 mmol/L (ref 96–106)
Creatinine, Ser: 0.8 mg/dL (ref 0.76–1.27)
GFR calc Af Amer: 105 mL/min/{1.73_m2} (ref >59)
GFR calc non Af Amer: 91 mL/min/{1.73_m2} (ref >59)
GLUCOSE: 98 mg/dL (ref 65–99)
POTASSIUM: 4.2 mmol/L (ref 3.5–5.2)
SODIUM: 142 mmol/L (ref 134–144)

## 2017-01-22 LAB — CBC
Hematocrit: 42.1 % (ref 37.5–51.0)
Hemoglobin: 14 g/dL (ref 13.0–17.7)
MCH: 30.5 pg (ref 26.6–33.0)
MCHC: 33.3 g/dL (ref 31.5–35.7)
MCV: 92 fL (ref 79–97)
PLATELETS: 217 10*3/uL (ref 150–379)
RBC: 4.59 x10E6/uL (ref 4.14–5.80)
RDW: 14 % (ref 12.3–15.4)
WBC: 6.1 10*3/uL (ref 3.4–10.8)

## 2017-01-28 ENCOUNTER — Ambulatory Visit (HOSPITAL_COMMUNITY)
Admission: RE | Admit: 2017-01-28 | Discharge: 2017-01-28 | Disposition: A | Discharge status: Home / Self Care | Payer: Medicare HMO | Source: Ambulatory Visit | Attending: Internal Medicine | Admitting: Internal Medicine

## 2017-01-28 ENCOUNTER — Encounter (HOSPITAL_COMMUNITY): Payer: Self-pay | Admitting: Internal Medicine

## 2017-01-28 ENCOUNTER — Encounter (HOSPITAL_COMMUNITY)
Admission: RE | Disposition: A | Discharge status: Home / Self Care | Payer: Self-pay | Source: Ambulatory Visit | Attending: Internal Medicine

## 2017-01-28 DIAGNOSIS — G4730 Sleep apnea, unspecified: Secondary | ICD-10-CM | POA: Insufficient documentation

## 2017-01-28 DIAGNOSIS — I509 Heart failure, unspecified: Secondary | ICD-10-CM | POA: Insufficient documentation

## 2017-01-28 DIAGNOSIS — I428 Other cardiomyopathies: Secondary | ICD-10-CM | POA: Insufficient documentation

## 2017-01-28 DIAGNOSIS — Z7901 Long term (current) use of anticoagulants: Secondary | ICD-10-CM | POA: Diagnosis not present

## 2017-01-28 DIAGNOSIS — Z823 Family history of stroke: Secondary | ICD-10-CM | POA: Diagnosis not present

## 2017-01-28 DIAGNOSIS — Z8249 Family history of ischemic heart disease and other diseases of the circulatory system: Secondary | ICD-10-CM | POA: Diagnosis not present

## 2017-01-28 DIAGNOSIS — I110 Hypertensive heart disease with heart failure: Secondary | ICD-10-CM | POA: Diagnosis not present

## 2017-01-28 DIAGNOSIS — I482 Chronic atrial fibrillation: Secondary | ICD-10-CM | POA: Insufficient documentation

## 2017-01-28 DIAGNOSIS — Z950 Presence of cardiac pacemaker: Secondary | ICD-10-CM | POA: Diagnosis present

## 2017-01-28 DIAGNOSIS — E785 Hyperlipidemia, unspecified: Secondary | ICD-10-CM | POA: Diagnosis not present

## 2017-01-28 DIAGNOSIS — I442 Atrioventricular block, complete: Secondary | ICD-10-CM | POA: Insufficient documentation

## 2017-01-28 DIAGNOSIS — I4891 Unspecified atrial fibrillation: Secondary | ICD-10-CM | POA: Diagnosis present

## 2017-01-28 DIAGNOSIS — M19011 Primary osteoarthritis, right shoulder: Secondary | ICD-10-CM | POA: Diagnosis not present

## 2017-01-28 DIAGNOSIS — Z45010 Encounter for checking and testing of cardiac pacemaker pulse generator [battery]: Secondary | ICD-10-CM | POA: Insufficient documentation

## 2017-01-28 HISTORY — PX: BIV PACEMAKER GENERATOR CHANGEOUT: EP1198

## 2017-01-28 LAB — SURGICAL PCR SCREEN
MRSA, PCR: NEGATIVE
STAPHYLOCOCCUS AUREUS: NEGATIVE

## 2017-01-28 SURGERY — BIV PACEMAKER GENERATOR CHANGEOUT

## 2017-01-28 MED ORDER — SODIUM CHLORIDE 0.9 % IR SOLN
80.0000 mg | Status: AC
  Administered 2017-01-28: 80 mg
  Filled 2017-01-28: qty 2

## 2017-01-28 MED ORDER — CEFAZOLIN SODIUM-DEXTROSE 2-4 GM/100ML-% IV SOLN
2.0000 g | INTRAVENOUS | Status: AC
  Administered 2017-01-28: 2 g via INTRAVENOUS

## 2017-01-28 MED ORDER — CEFAZOLIN SODIUM-DEXTROSE 2-4 GM/100ML-% IV SOLN
INTRAVENOUS | Status: AC
  Filled 2017-01-28: qty 100

## 2017-01-28 MED ORDER — FENTANYL CITRATE (PF) 100 MCG/2ML IJ SOLN
INTRAMUSCULAR | Status: AC
  Filled 2017-01-28: qty 2

## 2017-01-28 MED ORDER — SODIUM CHLORIDE 0.9 % IV SOLN
INTRAVENOUS | Status: DC
  Administered 2017-01-28: 09:00:00 via INTRAVENOUS

## 2017-01-28 MED ORDER — ONDANSETRON HCL 4 MG/2ML IJ SOLN: 4.0000 mg | Freq: Four times a day (QID) | INTRAMUSCULAR | Status: DC | PRN

## 2017-01-28 MED ORDER — LIDOCAINE HCL (PF) 1 % IJ SOLN
INTRAMUSCULAR | Status: AC
  Filled 2017-01-28: qty 60

## 2017-01-28 MED ORDER — MIDAZOLAM HCL 5 MG/5ML IJ SOLN
INTRAMUSCULAR | Status: DC | PRN
  Administered 2017-01-28: 1 mg via INTRAVENOUS
  Administered 2017-01-28: 2 mg via INTRAVENOUS
  Administered 2017-01-28 (×2): 1 mg via INTRAVENOUS

## 2017-01-28 MED ORDER — ACETAMINOPHEN 325 MG PO TABS
325.0000 mg | ORAL_TABLET | ORAL | Status: DC | PRN
  Filled 2017-01-28: qty 2

## 2017-01-28 MED ORDER — MUPIROCIN 2 % EX OINT
TOPICAL_OINTMENT | CUTANEOUS | Status: AC
  Filled 2017-01-28: qty 22

## 2017-01-28 MED ORDER — LIDOCAINE HCL (PF) 1 % IJ SOLN
INTRAMUSCULAR | Status: DC | PRN
  Administered 2017-01-28: 45 mL

## 2017-01-28 MED ORDER — MUPIROCIN 2 % EX OINT
TOPICAL_OINTMENT | Freq: Once | CUTANEOUS | Status: AC
  Administered 2017-01-28: 1 via NASAL

## 2017-01-28 MED ORDER — MIDAZOLAM HCL 5 MG/5ML IJ SOLN
INTRAMUSCULAR | Status: AC
  Filled 2017-01-28: qty 5

## 2017-01-28 MED ORDER — CHLORHEXIDINE GLUCONATE 4 % EX LIQD: 60.0000 mL | Freq: Once | CUTANEOUS | Status: DC

## 2017-01-28 MED ORDER — SODIUM CHLORIDE 0.9 % IV SOLN: INTRAVENOUS | Status: AC

## 2017-01-28 MED ORDER — FENTANYL CITRATE (PF) 100 MCG/2ML IJ SOLN
INTRAMUSCULAR | Status: DC | PRN
  Administered 2017-01-28 (×3): 25 ug via INTRAVENOUS
  Administered 2017-01-28: 50 ug via INTRAVENOUS

## 2017-01-28 SURGICAL SUPPLY — 8 items
CABLE SURGICAL S-101-97-12 (CABLE) ×2 IMPLANT
HEMOSTAT SURGICEL 2X4 FIBR (HEMOSTASIS) ×2 IMPLANT
PACEMAKER ALLR CRT-P RF PM3222 (Pacemaker) IMPLANT
PAD DEFIB LIFELINK (PAD) ×2 IMPLANT
POUCH AIGIS-R ANTIBACT ICD (Mesh General) ×3 IMPLANT
POUCH AIGIS-R ANTIBACT ICD LRG (Mesh General) IMPLANT
PPM ALLURE CRT-P RF PM3222 (Pacemaker) ×3 IMPLANT
TRAY PACEMAKER INSERTION (PACKS) ×2 IMPLANT

## 2017-01-28 NOTE — H&P (Signed)
Patient Care Team: [name], MD as PCP - General (Internal Medicine)   HPI  [name] Page is a 71 y.o. male Admitted for pacemaker generator replacement  s/p AV ablation yrs ago for uncontrolled atrial arrhythmia and s/p pacer with interval upgrade to cRT 2010  Now at [facility]  Last LV function assessment yrs ago  Some DOE and edema  Takes prn diuretic    Past Medical History:  Diagnosis Date  . Anal fissure   . Anal fistula   . Atrial fibrillation ([place])   . Blood transfusion without reported diagnosis   . Cardiomyopathy    Nonishemic, possibly tachycardia-mediated. 1997 LHC with no significant CAD. Myoview (7/09) showed EF 44% with possible mild inferior ischemia. Echo (9/10): EF 60%  . Cataract   . CHF (congestive heart failure) ([place])   . Chronic atrial fibrillation (HCC)    s/p AV nodal ablation and on Coumadin. Patient developed dyspnea/fatigue with RV  pacing and an LV lead was placed  . Colon polyp    hyperplastic and adenomatous  . HTN (hypertension)   . Hyperlipidemia   . Hyperplastic colon polyp    and adenomatous  . Osteoarthritis of right shoulder region   . Pacemaker    St. Jude CRT-P device  . Pseudogout   . Sleep apnea   . Upper GI bleed    With duodenal ulcer in 2005.  Patient was on ASA and coumadin at that time. ASA was stopped..    Past Surgical History:  Procedure Laterality Date  . PACEMAKER INSERTION     St. Jude's  . SHOULDER SURGERY     Right  . TREATMENT FISTULA ANAL      Current Facility-Administered Medications  Medication Dose Route Frequency Provider Last Rate Last Dose  . 0.9 %  sodium chloride infusion   Intravenous Continuous Amber Sena Slate, NP      . ceFAZolin (ANCEF) IVPB 2g/100 mL premix  2 g Intravenous On Call Amber Sena Slate, NP      . chlorhexidine (HIBICLENS) 4 % liquid 4 application  60 mL Topical Once Amber Sena Slate, NP      . gentamicin (GARAMYCIN) 80 mg in sodium chloride irrigation 0.9 % 500 mL irrigation   80 mg Irrigation On Call Patsey Berthold, NP      . mupirocin ointment Drue Stager) 2 %   Nasal Once [name], MD      . mupirocin ointment (BACTROBAN) 2 %             No Known Allergies    Social History  Substance Use Topics  . Smoking status: Never Smoker  . Smokeless tobacco: Never Used  . Alcohol use No     Comment: Very rare     Family History  Problem Relation Age of Onset  . Heart disease Mother   . Stroke Father   . Diverticulitis Sister   . Colon cancer Neg Hx      No current facility-administered medications on file prior to encounter.    Current Outpatient Prescriptions on File Prior to Encounter  Medication Sig Dispense Refill  . carvedilol (COREG) 25 MG tablet Take 25 mg by mouth 2 (two) times daily.      Marland Kitchen ELIQUIS 5 MG TABS tablet Take 5 mg by mouth 2 (two) times daily.    . furosemide (LASIX) 40 MG tablet Take 40 mg by mouth daily as needed (swelling).     Marland Kitchen  hydrochlorothiazide 25 MG tablet Take 25 mg by mouth daily.      . ramipril (ALTACE) 10 MG capsule Take 10 mg by mouth 2 (two) times daily.      . rosuvastatin (CRESTOR) 40 MG tablet Take 20 mg by mouth daily with supper.     . traMADol (ULTRAM) 50 MG tablet Take 50 mg by mouth daily as needed for moderate pain.     Marland Kitchen colchicine 0.6 MG tablet Take 0.6 mg by mouth daily as needed (gout flare).     . potassium chloride SA (K-DUR,KLOR-CON) 20 MEQ tablet Take 20 mEq by mouth daily as needed (When you take Lasix).         Review of Systems negative except from HPI and PMH  Physical Exam BP (!) 154/87 (BP Location: Right Arm)   Pulse 73   Temp 97.7 F (36.5 C) (Oral)   Ht 6\' 1"  (1.854 m)   Wt 240 lb (108.9 kg)   SpO2 100%   BMI 31.66 kg/m  Well developed and well nourished in no acute distress HENT normal E scleral and icterus clear Neck Supple JVP flat; carotids brisk and full Clear to ausculation Device pocket well healed; without hematoma or erythema.  There is no tethering  Regular rate  and rhythm, no murmurs gallops or rub Soft with active bowel sounds No clubbing cyanosis 1+ Edema Alert and oriented, grossly normal motor and sensory function Skin Warm and Dry    Assessment and  Plan  AV Ablation and complete heart block  Hypertension  CRT=P St Jude  At ERI  CHF/fluid overload  ? LV function   We have reviewed the benefits and risks of generator replacement.  These include but are not limited to lead fracture and infection.  The patient understands, agrees and is willing to proceed.    Follow BP    We will eval LF vunction at wound check

## 2017-01-28 NOTE — Discharge Instructions (Signed)

## 2017-02-05 ENCOUNTER — Telehealth: Payer: Self-pay | Admitting: Internal Medicine

## 2017-02-05 NOTE — Telephone Encounter (Signed)
Returned [name] Page call. He c/o increasing pain from shoulder to hand with intermittent "shooting" pain or "twinges". He denies swelling at site, in arm and hand. He denies any tender, warm, reddened area of arm/hand. He reports that he has been limiting the use of his arm since PPM generator replacement. I have advised him to start to use his affected arm as comfortable to prevent frozen shoulder and to call back if he notices swelling in his arm/hand or if the pain worsens with use. He verbalizes understanding.

## 2017-02-05 NOTE — Telephone Encounter (Signed)
New message    Pt is complaining of pain in his left arm. HE had his device implanted 4/18. He said he thought the pain would go away, but it has not.

## 2017-02-09 ENCOUNTER — Ambulatory Visit (INDEPENDENT_AMBULATORY_CARE_PROVIDER_SITE_OTHER): Payer: Medicare HMO | Admitting: *Deleted

## 2017-02-09 ENCOUNTER — Encounter (INDEPENDENT_AMBULATORY_CARE_PROVIDER_SITE_OTHER): Payer: Self-pay

## 2017-02-09 DIAGNOSIS — I442 Atrioventricular block, complete: Secondary | ICD-10-CM

## 2017-02-09 NOTE — Progress Notes (Signed)
Wound check appointment. Dermabond removed. Wound without redness or edema. Incision edges approximated, wound well healed. Normal device function. Thresholds, sensing, and impedances consistent with implant measurements. Device programmed at chronic values s/p gen change. Histogram distribution appropriate for patient and level of activity. No mode switches or ventricular arrhythmias noted. Pt states that he feels more diaphragmatic stimulation s/p gen change. Unable to re-create but reports a frequency of almost daily. LV pulse amplitude adjusted to 2.5V, LV pulse configuration changed to bipolar with LV cap confirm turned off. ROV with SK in 43mo.

## 2017-03-17 DIAGNOSIS — I4891 Unspecified atrial fibrillation: Secondary | ICD-10-CM | POA: Diagnosis not present

## 2017-03-17 DIAGNOSIS — Z79899 Other long term (current) drug therapy: Secondary | ICD-10-CM | POA: Diagnosis not present

## 2017-04-06 DIAGNOSIS — K006 Disturbances in tooth eruption: Secondary | ICD-10-CM | POA: Diagnosis not present

## 2017-04-06 DIAGNOSIS — R69 Illness, unspecified: Secondary | ICD-10-CM | POA: Diagnosis not present

## 2017-04-10 DIAGNOSIS — G4733 Obstructive sleep apnea (adult) (pediatric): Secondary | ICD-10-CM | POA: Diagnosis not present

## 2017-04-29 ENCOUNTER — Ambulatory Visit (INDEPENDENT_AMBULATORY_CARE_PROVIDER_SITE_OTHER): Payer: Medicare HMO | Admitting: Internal Medicine

## 2017-04-29 ENCOUNTER — Encounter: Payer: Self-pay | Admitting: Internal Medicine

## 2017-04-29 VITALS — BP 130/74 | HR 91 | Ht 74.0 in | Wt 242.0 lb

## 2017-04-29 DIAGNOSIS — I442 Atrioventricular block, complete: Secondary | ICD-10-CM | POA: Diagnosis not present

## 2017-04-29 DIAGNOSIS — Z950 Presence of cardiac pacemaker: Secondary | ICD-10-CM

## 2017-04-29 DIAGNOSIS — I482 Chronic atrial fibrillation: Secondary | ICD-10-CM | POA: Diagnosis not present

## 2017-04-29 DIAGNOSIS — I4821 Permanent atrial fibrillation: Secondary | ICD-10-CM

## 2017-04-29 NOTE — Progress Notes (Signed)
Patient Care Team: [name], MD as PCP - General (Internal Medicine)   HPI  [name] Page is a 71 y.o. male Seen in followup for CRT-PM placement following AV ablation for permanent and rapid Afib years ago.  He underwent generator replacement 4/18  No recent LV function assessment   Date Cr Hgb  4/18 0.8 14        The patient denies chest pain, shortness of breath, nocturnal dyspnea, orthopnea There have been no palpitations, lightheadedness or syncope.  He has peripheral edema L> R which he says developed after trauma;  Some warmth, self medicated with colchicine helped some  No hx of asymmetric edema;  No fevers chills etc  Records and Results Reviewed   Past Medical History:  Diagnosis Date  . Anal fissure   . Anal fistula   . Atrial fibrillation ([place])   . Blood transfusion without reported diagnosis   . Cardiomyopathy    Nonishemic, possibly tachycardia-mediated. 1997 LHC with no significant CAD. Myoview (7/09) showed EF 44% with possible mild inferior ischemia. Echo (9/10): EF 60%  . Cataract   . CHF (congestive heart failure) ([place])   . Chronic atrial fibrillation (HCC)    s/p AV nodal ablation and on Coumadin. Patient developed dyspnea/fatigue with RV  pacing and an LV lead was placed  . Colon polyp    hyperplastic and adenomatous  . HTN (hypertension)   . Hyperlipidemia   . Hyperplastic colon polyp    and adenomatous  . Osteoarthritis of right shoulder region   . Pacemaker    St. Jude CRT-P device  . Pseudogout   . Sleep apnea   . Upper GI bleed    With duodenal ulcer in 2005.  Patient was on ASA and coumadin at that time. ASA was stopped..    Past Surgical History:  Procedure Laterality Date  . [place] N/A 01/28/2017   Procedure: BiV Pacemaker Generator Changeout;  Surgeon: [name], MD;  Location: [facility];  Service: Cardiovascular;  Laterality: N/A;  . PACEMAKER INSERTION     St. Jude's  .  SHOULDER SURGERY     Right  . TREATMENT FISTULA ANAL      Current Outpatient Prescriptions  Medication Sig Dispense Refill  . carvedilol (COREG) 25 MG tablet Take 25 mg by mouth 2 (two) times daily.      . colchicine 0.6 MG tablet Take 0.6 mg by mouth daily as needed (gout flare).     Marland Kitchen ELIQUIS 5 MG TABS tablet Take 5 mg by mouth 2 (two) times daily.    . furosemide (LASIX) 40 MG tablet Take 40 mg by mouth daily as needed (swelling).     . hydrochlorothiazide 25 MG tablet Take 25 mg by mouth daily.      . Multiple Vitamin (MULTIVITAMIN WITH MINERALS) TABS tablet Take 1 tablet by mouth daily.    Marland Kitchen omeprazole (PRILOSEC OTC) 20 MG tablet Take 20 mg by mouth daily.    . potassium chloride SA (K-DUR,KLOR-CON) 20 MEQ tablet Take 20 mEq by mouth daily as needed (When you take Lasix).     . ramipril (ALTACE) 10 MG capsule Take 10 mg by mouth 2 (two) times daily.      . rosuvastatin (CRESTOR) 40 MG tablet Take 20 mg by mouth daily with supper.     . traMADol (ULTRAM) 50 MG tablet Take 50 mg by mouth daily as needed for moderate pain.  No current facility-administered medications for this visit.     No Known Allergies    Review of Systems negative except from HPI and PMH  Physical Exam BP 130/74   Pulse 91   Ht 6\' 2"  (1.88 m)   Wt 242 lb (109.8 kg)   SpO2 97%   BMI 31.07 kg/m  Well developed and well nourished in no acute distress HENT normal E scleral and icterus clear Neck Supple JVP flat; carotids brisk and full Clear to ausculation Device pocket well healed; without hematoma or erythema.  There is no tethering  Regular rate and rhythm, no murmurs gallops or rub Soft with active bowel sounds No clubbing cyanosis 2+L>R  Edema Alert and oriented, grossly normal motor and sensory function Skin Warm and Dry  ECG Atrial fib With BIV pacing QRS neg 1 and pos V1  Assessment and  Plan Atrial fibrillation-permanent  Complete heart block status post AV  ablation  Pacemaker-CRT-St. Jude  Asymmetric edema-new The patient has asymmetric edema without associated symptoms. He takes his diuretics infrequently.  The asymmetry began after swelling developed. His left foot. He thought was gout  I am concerned the asymmetry suggests obstruction  Will need to image the L leg         Current medicines are reviewed at length with the patient today .  The patient does not have concerns regarding medicines.\

## 2017-04-29 NOTE — Patient Instructions (Addendum)
Medication Instructions: - Your physician has recommended you make the following change in your medication:  1) Increase lasix (furosemide) 40 mg- take 1/2 tablet (20 mg) by mouth twice daily as needed 3 days out of the next week   Labwork: - none ordered  Procedures/Testing: - Your physician has requested that you have an echocardiogram. Echocardiography is a painless test that uses sound waves to create images of your heart. It provides your doctor with information about the size and shape of your heart and how well your heart's chambers and valves are working. This procedure takes approximately one hour. There are no restrictions for this procedure.  Follow-Up: - Remote monitoring is used to monitor your Pacemaker of ICD from home. This monitoring reduces the number of office visits required to check your device to one time per year. It allows [place] to keep an eye on the functioning of your device to ensure it is working properly. You are scheduled for a device check from home on 07/30/17. You may send your transmission at any time that day. If you have a wireless device, the transmission will be sent automatically. After your physician reviews your transmission, you will receive a postcard with your next transmission date.  - Your physician wants you to follow-up in: 9 months with Dr. Caryl Comes.  You will receive a reminder letter in the mail two months in advance. If you don't receive a letter, please call our office to schedule the follow-up appointment.   Any Additional Special Instructions Will Be Listed Below (If Applicable).     If you need a refill on your cardiac medications before your next appointment, please call your pharmacy.

## 2017-04-30 LAB — CUP PACEART INCLINIC DEVICE CHECK
Battery Remaining Longevity: 81 mo
Battery Voltage: 2.99 V
Brady Statistic RV Percent Paced: 99.83 %
Date Time Interrogation Session: 20180718180453
Implantable Lead Implant Date: 20100414
Implantable Lead Location: 753860
Implantable Pulse Generator Implant Date: 20180418
Lead Channel Pacing Threshold Amplitude: 0.75 V
Lead Channel Pacing Threshold Amplitude: 1 V
Lead Channel Pacing Threshold Amplitude: 1 V
Lead Channel Pacing Threshold Pulse Width: 0.8 ms
Lead Channel Pacing Threshold Pulse Width: 0.8 ms
Lead Channel Setting Pacing Amplitude: 2 V
Lead Channel Setting Pacing Amplitude: 2.5 V
Lead Channel Setting Sensing Sensitivity: 4 mV
MDC IDC LEAD IMPLANT DT: 20000217
MDC IDC LEAD LOCATION: 753858
MDC IDC MSMT LEADCHNL LV IMPEDANCE VALUE: 937.5 Ohm
MDC IDC MSMT LEADCHNL LV PACING THRESHOLD AMPLITUDE: 0.75 V
MDC IDC MSMT LEADCHNL LV PACING THRESHOLD PULSEWIDTH: 0.5 ms
MDC IDC MSMT LEADCHNL LV PACING THRESHOLD PULSEWIDTH: 0.5 ms
MDC IDC MSMT LEADCHNL RV IMPEDANCE VALUE: 437.5 Ohm
MDC IDC SET LEADCHNL LV PACING PULSEWIDTH: 0.5 ms
MDC IDC SET LEADCHNL RV PACING PULSEWIDTH: 0.8 ms
MDC IDC STAT BRADY RA PERCENT PACED: 0 %
Pulse Gen Model: 3222
Pulse Gen Serial Number: 8013108

## 2017-05-04 DIAGNOSIS — I10 Essential (primary) hypertension: Secondary | ICD-10-CM | POA: Diagnosis not present

## 2017-05-04 DIAGNOSIS — Z125 Encounter for screening for malignant neoplasm of prostate: Secondary | ICD-10-CM | POA: Diagnosis not present

## 2017-05-04 DIAGNOSIS — E784 Other hyperlipidemia: Secondary | ICD-10-CM | POA: Diagnosis not present

## 2017-05-07 DIAGNOSIS — Z0000 Encounter for general adult medical examination without abnormal findings: Secondary | ICD-10-CM | POA: Diagnosis not present

## 2017-05-07 DIAGNOSIS — J302 Other seasonal allergic rhinitis: Secondary | ICD-10-CM | POA: Diagnosis not present

## 2017-05-07 DIAGNOSIS — E784 Other hyperlipidemia: Secondary | ICD-10-CM | POA: Diagnosis not present

## 2017-05-07 DIAGNOSIS — M25561 Pain in right knee: Secondary | ICD-10-CM | POA: Diagnosis not present

## 2017-05-07 DIAGNOSIS — I872 Venous insufficiency (chronic) (peripheral): Secondary | ICD-10-CM | POA: Diagnosis not present

## 2017-05-07 DIAGNOSIS — M1990 Unspecified osteoarthritis, unspecified site: Secondary | ICD-10-CM | POA: Diagnosis not present

## 2017-05-07 DIAGNOSIS — I10 Essential (primary) hypertension: Secondary | ICD-10-CM | POA: Diagnosis not present

## 2017-05-07 DIAGNOSIS — Z950 Presence of cardiac pacemaker: Secondary | ICD-10-CM | POA: Diagnosis not present

## 2017-05-07 DIAGNOSIS — G4730 Sleep apnea, unspecified: Secondary | ICD-10-CM | POA: Diagnosis not present

## 2017-05-08 DIAGNOSIS — Z1212 Encounter for screening for malignant neoplasm of rectum: Secondary | ICD-10-CM | POA: Diagnosis not present

## 2017-05-11 ENCOUNTER — Ambulatory Visit (HOSPITAL_COMMUNITY): Payer: Medicare HMO | Attending: Internal Medicine

## 2017-05-11 ENCOUNTER — Other Ambulatory Visit: Payer: Self-pay

## 2017-05-11 DIAGNOSIS — I482 Chronic atrial fibrillation: Secondary | ICD-10-CM | POA: Diagnosis not present

## 2017-05-11 DIAGNOSIS — E785 Hyperlipidemia, unspecified: Secondary | ICD-10-CM | POA: Insufficient documentation

## 2017-05-11 DIAGNOSIS — I4821 Permanent atrial fibrillation: Secondary | ICD-10-CM

## 2017-05-11 DIAGNOSIS — I4891 Unspecified atrial fibrillation: Secondary | ICD-10-CM | POA: Insufficient documentation

## 2017-05-11 DIAGNOSIS — I110 Hypertensive heart disease with heart failure: Secondary | ICD-10-CM | POA: Insufficient documentation

## 2017-05-11 DIAGNOSIS — I509 Heart failure, unspecified: Secondary | ICD-10-CM | POA: Diagnosis not present

## 2017-05-11 DIAGNOSIS — I429 Cardiomyopathy, unspecified: Secondary | ICD-10-CM | POA: Diagnosis not present

## 2017-05-11 DIAGNOSIS — I071 Rheumatic tricuspid insufficiency: Secondary | ICD-10-CM | POA: Insufficient documentation

## 2017-05-21 ENCOUNTER — Telehealth: Payer: Self-pay | Admitting: Internal Medicine

## 2017-05-21 NOTE — Telephone Encounter (Signed)
Patient calling for echo results 

## 2017-05-21 NOTE — Telephone Encounter (Signed)
To Dr. Caryl Comes- please sign off on echo report. Thanks!

## 2017-05-22 NOTE — Telephone Encounter (Signed)
I called and spoke with the patient and advised him of his echo results. He stated that Dr. Caryl Comes mentioned a potential change in his medication pending his echo results- the patient thought ramipril was mentioned.  I advised him that I did not see this mentioned on his echo report or in Dr. Olin Pia note.  Will review further with Dr. Caryl Comes and call him back next week.  He is agreeable.

## 2017-05-26 MED ORDER — RAMIPRIL 10 MG PO CAPS: 10.0000 mg | ORAL_CAPSULE | Freq: Every day | ORAL | Status: DC

## 2017-05-26 NOTE — Telephone Encounter (Signed)
Per Dr. Caryl Comes- he spoke with the patient's wife and instructed the patient to decrease ramipril to once daily.

## 2017-06-27 DIAGNOSIS — Z23 Encounter for immunization: Secondary | ICD-10-CM | POA: Diagnosis not present

## 2017-07-09 ENCOUNTER — Ambulatory Visit (INDEPENDENT_AMBULATORY_CARE_PROVIDER_SITE_OTHER): Payer: Medicare HMO | Admitting: Neurology

## 2017-07-09 ENCOUNTER — Encounter: Payer: Self-pay | Admitting: Neurology

## 2017-07-09 VITALS — BP 146/93 | HR 117 | Ht 74.0 in | Wt 250.0 lb

## 2017-07-09 DIAGNOSIS — Z9989 Dependence on other enabling machines and devices: Secondary | ICD-10-CM

## 2017-07-09 DIAGNOSIS — G4733 Obstructive sleep apnea (adult) (pediatric): Secondary | ICD-10-CM | POA: Diagnosis not present

## 2017-07-09 NOTE — Progress Notes (Signed)
Subjective:    Patient ID: [name] Page is a 71 y.o. male.  HPI     Interim history:   Mr. Moster is a very pleasant 71 year old right-handed gentleman with an underlying medical history of hyperlipidemia, arthritis, A. fib, s/p ablation in 2000, status post pacemaker placement, obesity, allergic rhinitis, BPH, who presents for followup consultation of his obstructive sleep apnea, on treatment with CPAP therapy. The patient is unaccompanied today. I last saw him on 07/07/2016, at which time he was fully compliant with his CPAP, doing well. He was physically active, he had no telltale restless leg symptoms. I suggested a one-year checkup.  Today, 07/09/2017: I reviewed his CPAP compliance data from 06/08/2017 through 07/07/2017 which is a total of 30 days, during which time he was under percent compliant with his CPAP, average usage of 7 hours and 11 minutes, AHI 0.5, leak acceptable for the 95th percentile at 10.8 L/m on a pressure of 9 cm with EPR of 2. He reports doing well. He had an interim pacemaker generator exchange on 01/28/2017 without any problems. He still plays golf on a regular basis. He feels well.   The patient's allergies, current medications, family history, past medical history, past social history, past surgical history and problem list were reviewed and updated as appropriate.   Previously (copied from previous notes for reference):   I saw him on 07/05/2015, at which time he reported doing well. He was fully compliant with CPAP therapy and his exam was stable. He had stopped taking Coumadin and was on Eliquis. He requested heated tubing for his CPAP machine but otherwise left his CPAP. I suggested a one-year checkup.    I reviewed his CPAP compliance data from 06/03/2016 through 07/02/2016, which is a total of 30 days, during which time he used his machine every night with percent used days greater than 4 hours at 100%, indicating superb compliance with an average usage of  7 hours and 25 minutes, residual AHI low at 0.7 per hour, leak acceptable with the 95th percentile at 12.5 L/m on a pressure of 9 cm with EPR of 2.   I saw him on 01/02/2015, at which time he reported treatment compliance with CPAP. He changed from advanced home care to [place]. He retired in October 2015 and has been playing more golf. He uses his elliptical daily for about 30 minutes.   I reviewed his CPAP compliance data from 06/04/2015 through 07/03/2015 which is a total of 30 days during which time he used his machine every night with percent used days greater than 4 hours at 100%, indicating superb compliance with an average usage of 7 hours and 13 minutes, residual AHI low at 0.9 per hour, leak acceptable with the 95th percentile at 14.8 L/m on a pressure of 9 cm with EPR of 2.   I saw him on 06/29/2014, at which time he reported doing much better. He felt, he was sleeping better, his wife was pleased with how he was sleeping, his daytime somnolence and sleep consolidation were improved. He denied any restless leg symptoms and was not bothered by any leg movements or twitching at night. Overall he had done really well and was very pleased. He was completely compliant with treatment and had no issues adjusting to the machine and the mask. He was using nasal pillows and liked them.   I reviewed his CPAP compliance data from 12/02/2014 through 12/31/2014 which is a total of 30 days during which time he used  his machine 28 days with percent used days greater than 4 hours of only 50%, indicating suboptimal compliance with an average usage of 3 hours and 59 minutes. 95th percentile of pressure was 10.2. Leak acceptable with the 95th percentile of leak at 12.9 L/m. Residual AHI at 3.8 per hour.    I first met him on 04/04/2014 at the request of his primary care physician, at which time the patient reported snoring, significant daytime somnolence and witnessed apneas per wife. I invited him back for sleep  study. He had a split-night sleep study on 04/19/2014, and I went over his test results with him in detail today. Baseline sleep efficiency was significantly reduced at 53.9% with a long latency to sleep of 39.5 minutes and wake after sleep onset of 67.5 minutes with moderate sleep fragmentation noted. He had an elevated arousal index. He had an increased percentage of stage I and stage II sleep as well as absence of slow-wave and REM sleep. He had no significant periodic leg movements and EKG showed known A. fib. He had a paced cardiac rhythm. His total AHI was elevated at 25 per hour. Baseline oxygen saturation was 93%, nadir was 73%. He was then titrated on CPAP. Sleep efficiency was 67%. He had a absence of deep sleep but increased percentage of dream sleep at 47.1% after CPAP. His average oxygen saturation was 93% with a nadir of 89%. He was titrated from 5-9 cm with a reduction of his AHI to 0 events per hour at the final pressure with supine REM sleep achieved. He had severe periodic leg movements during the treatment portion of the study but no significant arousals. Based on the test results I prescribed CPAP for him.   I reviewed the patient's CPAP compliance data from 05/16/2014 to 06/14/2014, which is a total of 30 days, during which time the patient used CPAP every day. The average usage for all days was 7 hours and 23 minutes. The percent used days greater than 4 hours was 100 %, indicating superb compliance. The residual AHI was 0.6 per hour, indicating an appropriate treatment pressure of 9 cwp with EPR of 2. Air leak from the mask was very low at 3 L per minute at the 95th percentile.  I reviewed his compliance data from 05/29/2014 through 06/27/2014 which is a total of 30 days during which time he used his machine every night with percent used days greater than 4 hours of 100%, again indicating flawless compliance. His average usage was 7 hours and 25 minutes. His residual AHI low at 0.3 with  leak very low at 0.8 at the current pressure of 9 cm with EPR of 2.    His Past Medical History Is Significant For: Past Medical History:  Diagnosis Date  . Anal fissure   . Anal fistula   . Atrial fibrillation ([place])   . Blood transfusion without reported diagnosis   . Cardiomyopathy    Nonishemic, possibly tachycardia-mediated. 1997 LHC with no significant CAD. Myoview (7/09) showed EF 44% with possible mild inferior ischemia. Echo (9/10): EF 60%  . Cataract   . CHF (congestive heart failure) ([place])   . Chronic atrial fibrillation (HCC)    s/p AV nodal ablation and on Coumadin. Patient developed dyspnea/fatigue with RV  pacing and an LV lead was placed  . Colon polyp    hyperplastic and adenomatous  . HTN (hypertension)   . Hyperlipidemia   . Hyperplastic colon polyp    and adenomatous  .  Osteoarthritis of right shoulder region   . Pacemaker    St. Jude CRT-P device  . Pseudogout   . Sleep apnea   . Upper GI bleed    With duodenal ulcer in 2005.  Patient was on ASA and coumadin at that time. ASA was stopped.Marland Kitchen    His Past Surgical History Is Significant For: Past Surgical History:  Procedure Laterality Date  . [place] N/A 01/28/2017   Procedure: BiV Pacemaker Generator Changeout;  Surgeon: [name], MD;  Location: [facility];  Service: Cardiovascular;  Laterality: N/A;  . PACEMAKER INSERTION     St. Jude's  . SHOULDER SURGERY     Right  . TREATMENT FISTULA ANAL      His Family History Is Significant For: Family History  Problem Relation Age of Onset  . Heart disease Mother   . Stroke Father   . Diverticulitis Sister   . Colon cancer Neg Hx     His Social History Is Significant For: Social History   Social History  . Marital status: Married    Spouse name: Di Kindle  . Number of children: 0  . Years of education: some coll.   Occupational History  . SALES Engineer, maintenance (IT)   Social History Main Topics  . Smoking  status: Never Smoker  . Smokeless tobacco: Never Used  . Alcohol use No     Comment: Very rare  . Drug use: No  . Sexual activity: Not Asked   Other Topics Concern  . None   Social History Narrative   Lives in [place].   Does a lot of traveling.   Exercises daily.   Daily Caffeine use.   Patient is right handed    His Allergies Are:  No Known Allergies:   His Current Medications Are:  Outpatient Encounter Prescriptions as of 07/09/2017  Medication Sig  . carvedilol (COREG) 25 MG tablet Take 25 mg by mouth 2 (two) times daily.    . colchicine 0.6 MG tablet Take 0.6 mg by mouth daily as needed (gout flare).   Marland Kitchen ELIQUIS 5 MG TABS tablet Take 5 mg by mouth 2 (two) times daily.  . furosemide (LASIX) 40 MG tablet Take 40 mg by mouth daily as needed (swelling).   . hydrochlorothiazide 25 MG tablet Take 25 mg by mouth daily.    . Multiple Vitamin (MULTIVITAMIN WITH MINERALS) TABS tablet Take 1 tablet by mouth daily.  Marland Kitchen omeprazole (PRILOSEC OTC) 20 MG tablet Take 20 mg by mouth daily.  . potassium chloride SA (K-DUR,KLOR-CON) 20 MEQ tablet Take 20 mEq by mouth daily as needed (When you take Lasix).   . ramipril (ALTACE) 10 MG capsule Take 1 capsule (10 mg total) by mouth daily.  . rosuvastatin (CRESTOR) 40 MG tablet Take 20 mg by mouth daily with supper.   . traMADol (ULTRAM) 50 MG tablet Take 50 mg by mouth daily as needed for moderate pain.    No facility-administered encounter medications on file as of 07/09/2017.   :  Review of Systems:  Out of a complete 14 point review of systems, all are reviewed and negative with the exception of these symptoms as listed below: Review of Systems  Neurological:       Pt presents today to discuss his cpap. Pt reports that his cpap is going well.    Objective:  Neurological Exam  Physical Exam Physical Examination:   Vitals:   07/09/17 1353  BP: (!) 146/93  Pulse: (!) 117   General Examination: The patient is a very pleasant 71  y.o. male in no acute distress. He appears well-developed and well-nourished and well groomed.    HEENT: Normocephalic, atraumatic, pupils are equal, round and reactive to light and accommodation. Extraocular tracking is good without limitation to gaze excursion or nystagmus noted. Normal smooth pursuit is noted. Hearing is grossly intact. Face is symmetric with normal facial animation and normal facial sensation. Speech is clear with no dysarthria noted. There is no hypophonia. There is no lip, neck/head, jaw or voice tremor. Neck is supple with full range of passive and active motion. Oropharynx exam reveals: mild mouth dryness, adequate dental hygiene and moderate airway crowding, due to narrow airway entry and redundant soft palate and larger uvula and wider tongue base. Mallampati is class II. Tongue protrudes centrally and palate elevates symmetrically. Tonsils are absent. He has no overbite.    Chest: Clear to auscultation without wheezing, rhonchi or crackles noted.  Heart: S1+S2+0, regular and normal without murmurs, rubs or gallops noted.   Abdomen: Soft, non-tender and non-distended with normal bowel sounds appreciated on auscultation.  Extremities: There is 1+ pitting edema in the distal lower extremities bilaterally mild erythema is noted distally bilaterally in the legs, chronic appearing discoloration and hyperpigmentation.  Skin: Warm and dry without trophic changes noted, chronic appearing changes distal legs. There are no varicose veins.  Musculoskeletal: exam reveals no obvious joint deformities, tenderness or joint swelling or erythema.   Neurologically:  Mental status: The patient is awake, alert and oriented in all 4 spheres. His immediate and remote memory, attention, language skills and fund of knowledge are appropriate. There is no evidence of aphasia, agnosia, apraxia or anomia. Speech is clear with normal prosody and enunciation. Thought process is linear. Mood is  normal and affect is normal.  Cranial nerves II - XII are as described above under HEENT exam. Motor exam: Normal bulk, strength and tone is noted. There is no drift, tremor or rebound. Romberg is negative. Reflexes are 2+ throughout. Fine motor skills and coordination: groslly intact.  Cerebellar testing: No dysmetria or intention tremor on finger to nose testing. There is no truncal or gait ataxia.  Sensory exam: intact to light touch in the upper and lower extremities.  Gait, station and balance: He stands easily. No veering to one side is noted. No leaning to one side is noted. Posture is age-appropriate and stance is narrow based. Gait shows normal stride length and normal pace. No problems turning are noted. Tandem walk is unremarkable.  Assessment and Plan:   In summary, AGASTYA MEISTER is a very pleasant 71 year old male with an underlying medical history of hyperlipidemia, arthritis, A. fib, status post pacemaker placement with recent pacemaker generator replacement in April 2018, obesity, allergic rhinitis, BPH, who presents for followup consultation of his obstructive sleep apnea, established on CPAP of 9 cm.  he is fully compliant with treatment and reports ongoing good results. I renewed his prescription for CPAP related supplies. He had a split-night sleep study on 04/19/14, which showed moderate to severe obstructive sleep apnea.  we briefly reviewed his test results and reviewed his most recent compliance data. DME continues to be [facility]. Physical exam is stable, his weight seems to fluctuate within 5 pounds up and down.  He is highly commended for his CPAP adherence. I suggested a one-year recheck, sooner as needed. I answered all his questions today and he was in agreement.  I spent 20  minutes in total face-to-face time with the patient, more than 50% of which was spent in counseling and coordination of care, reviewing test results, reviewing medication and discussing or reviewing the  diagnosis of OSA, its prognosis and treatment options. Pertinent laboratory and imaging test results that were available during this visit with the patient were reviewed by me and considered in my medical decision making (see chart for details).

## 2017-07-09 NOTE — Patient Instructions (Signed)
Please continue using your CPAP regularly. While your insurance requires that you use CPAP at least 4 hours each night on 70% of the nights, I recommend, that you not skip any nights and use it throughout the night if you can. Getting used to CPAP and staying with the treatment long term does take time and patience and discipline. Untreated obstructive sleep apnea when it is moderate to severe can have an adverse impact on cardiovascular health and raise her risk for heart disease, arrhythmias, hypertension, congestive heart failure, stroke and diabetes. Untreated obstructive sleep apnea causes sleep disruption, nonrestorative sleep, and sleep deprivation. This can have an impact on your day to day functioning and cause daytime sleepiness and impairment of cognitive function, memory loss, mood disturbance, and problems focussing. Using CPAP regularly can improve these symptoms.  Keep up the good work! I will see you in one year.  

## 2017-07-10 DIAGNOSIS — G4733 Obstructive sleep apnea (adult) (pediatric): Secondary | ICD-10-CM | POA: Diagnosis not present

## 2017-07-30 ENCOUNTER — Encounter: Payer: Medicare HMO | Admitting: *Deleted

## 2017-07-30 ENCOUNTER — Telehealth: Payer: Self-pay | Admitting: Cardiology

## 2017-07-30 NOTE — Telephone Encounter (Signed)
LMOVM reminding pt to send remote transmission.   

## 2017-07-31 ENCOUNTER — Encounter: Payer: Self-pay | Admitting: Cardiology

## 2017-08-03 ENCOUNTER — Telehealth: Payer: Self-pay | Admitting: Cardiology

## 2017-08-03 ENCOUNTER — Ambulatory Visit (INDEPENDENT_AMBULATORY_CARE_PROVIDER_SITE_OTHER): Payer: Medicare HMO | Admitting: *Deleted

## 2017-08-03 DIAGNOSIS — I442 Atrioventricular block, complete: Secondary | ICD-10-CM

## 2017-08-03 NOTE — Telephone Encounter (Signed)
Follow Up:; ° ° °Returning your call. °

## 2017-08-03 NOTE — Progress Notes (Signed)
Remote pacemaker transmission.   

## 2017-08-05 LAB — CUP PACEART REMOTE DEVICE CHECK
Battery Remaining Longevity: 86 mo
Date Time Interrogation Session: 20181022032929
Implantable Lead Implant Date: 20000217
Implantable Lead Location: 753860
Implantable Lead Model: 4196
Implantable Pulse Generator Implant Date: 20180418
Lead Channel Impedance Value: 930 Ohm
Lead Channel Pacing Threshold Amplitude: 0.75 V
Lead Channel Pacing Threshold Amplitude: 0.75 V
Lead Channel Pacing Threshold Pulse Width: 0.8 ms
Lead Channel Setting Pacing Amplitude: 2 V
Lead Channel Setting Pacing Pulse Width: 0.5 ms
MDC IDC LEAD IMPLANT DT: 20100414
MDC IDC LEAD LOCATION: 753858
MDC IDC MSMT BATTERY REMAINING PERCENTAGE: 95.5 %
MDC IDC MSMT BATTERY VOLTAGE: 2.99 V
MDC IDC MSMT LEADCHNL LV PACING THRESHOLD PULSEWIDTH: 0.5 ms
MDC IDC MSMT LEADCHNL RV IMPEDANCE VALUE: 410 Ohm
MDC IDC PG SERIAL: 8013108
MDC IDC SET LEADCHNL LV PACING AMPLITUDE: 2.5 V
MDC IDC SET LEADCHNL RV PACING PULSEWIDTH: 0.8 ms
MDC IDC SET LEADCHNL RV SENSING SENSITIVITY: 4 mV

## 2017-08-05 NOTE — Telephone Encounter (Signed)
LMOVM informing pt that his remote transmission was received.

## 2017-08-07 ENCOUNTER — Encounter: Payer: Self-pay | Admitting: Cardiology

## 2017-09-15 DIAGNOSIS — I4891 Unspecified atrial fibrillation: Secondary | ICD-10-CM | POA: Diagnosis not present

## 2017-09-15 DIAGNOSIS — Z7901 Long term (current) use of anticoagulants: Secondary | ICD-10-CM | POA: Diagnosis not present

## 2017-10-11 DIAGNOSIS — G4733 Obstructive sleep apnea (adult) (pediatric): Secondary | ICD-10-CM | POA: Diagnosis not present

## 2017-11-02 ENCOUNTER — Telehealth: Payer: Self-pay | Admitting: Cardiology

## 2017-11-02 ENCOUNTER — Ambulatory Visit (INDEPENDENT_AMBULATORY_CARE_PROVIDER_SITE_OTHER): Payer: Medicare HMO | Admitting: *Deleted

## 2017-11-02 DIAGNOSIS — I442 Atrioventricular block, complete: Secondary | ICD-10-CM

## 2017-11-02 NOTE — Telephone Encounter (Signed)
Spoke with pt and reminded pt of remote transmission that is due today. Pt verbalized understanding.   

## 2017-11-03 ENCOUNTER — Encounter: Payer: Self-pay | Admitting: Cardiology

## 2017-11-03 NOTE — Progress Notes (Signed)
Remote pacemaker transmission.   

## 2017-11-04 DIAGNOSIS — Z950 Presence of cardiac pacemaker: Secondary | ICD-10-CM | POA: Diagnosis not present

## 2017-11-04 DIAGNOSIS — M25561 Pain in right knee: Secondary | ICD-10-CM | POA: Diagnosis not present

## 2017-11-04 DIAGNOSIS — M1990 Unspecified osteoarthritis, unspecified site: Secondary | ICD-10-CM | POA: Diagnosis not present

## 2017-11-04 DIAGNOSIS — N401 Enlarged prostate with lower urinary tract symptoms: Secondary | ICD-10-CM | POA: Diagnosis not present

## 2017-11-04 DIAGNOSIS — I4891 Unspecified atrial fibrillation: Secondary | ICD-10-CM | POA: Diagnosis not present

## 2017-11-04 DIAGNOSIS — G4730 Sleep apnea, unspecified: Secondary | ICD-10-CM | POA: Diagnosis not present

## 2017-11-04 DIAGNOSIS — I872 Venous insufficiency (chronic) (peripheral): Secondary | ICD-10-CM | POA: Diagnosis not present

## 2017-11-04 DIAGNOSIS — I10 Essential (primary) hypertension: Secondary | ICD-10-CM | POA: Diagnosis not present

## 2017-11-04 DIAGNOSIS — E7849 Other hyperlipidemia: Secondary | ICD-10-CM | POA: Diagnosis not present

## 2017-11-25 LAB — CUP PACEART REMOTE DEVICE CHECK
Battery Remaining Percentage: 95.5 %
Battery Voltage: 2.98 V
Implantable Lead Implant Date: 20100414
Implantable Lead Location: 753858
Implantable Lead Location: 753860
Implantable Pulse Generator Implant Date: 20180418
Lead Channel Impedance Value: 390 Ohm
Lead Channel Impedance Value: 940 Ohm
Lead Channel Pacing Threshold Amplitude: 0.75 V
Lead Channel Pacing Threshold Amplitude: 0.875 V
Lead Channel Pacing Threshold Pulse Width: 0.5 ms
Lead Channel Sensing Intrinsic Amplitude: 12 mV
Lead Channel Setting Pacing Amplitude: 2 V
Lead Channel Setting Pacing Pulse Width: 0.5 ms
Lead Channel Setting Pacing Pulse Width: 0.8 ms
Lead Channel Setting Sensing Sensitivity: 4 mV
MDC IDC LEAD IMPLANT DT: 20000217
MDC IDC MSMT BATTERY REMAINING LONGEVITY: 84 mo
MDC IDC MSMT LEADCHNL RV PACING THRESHOLD PULSEWIDTH: 0.8 ms
MDC IDC PG SERIAL: 8013108
MDC IDC SESS DTM: 20190121201605
MDC IDC SET LEADCHNL LV PACING AMPLITUDE: 2.5 V

## 2017-12-03 DIAGNOSIS — H35033 Hypertensive retinopathy, bilateral: Secondary | ICD-10-CM | POA: Diagnosis not present

## 2017-12-03 DIAGNOSIS — H25013 Cortical age-related cataract, bilateral: Secondary | ICD-10-CM | POA: Diagnosis not present

## 2017-12-03 DIAGNOSIS — H353131 Nonexudative age-related macular degeneration, bilateral, early dry stage: Secondary | ICD-10-CM | POA: Diagnosis not present

## 2017-12-03 DIAGNOSIS — H2513 Age-related nuclear cataract, bilateral: Secondary | ICD-10-CM | POA: Diagnosis not present

## 2018-01-05 ENCOUNTER — Encounter: Payer: Self-pay | Admitting: Internal Medicine

## 2018-01-05 ENCOUNTER — Ambulatory Visit: Payer: Medicare HMO | Admitting: Internal Medicine

## 2018-01-05 VITALS — BP 124/78 | HR 79 | Ht 73.0 in | Wt 251.0 lb

## 2018-01-05 DIAGNOSIS — Z950 Presence of cardiac pacemaker: Secondary | ICD-10-CM

## 2018-01-05 DIAGNOSIS — I442 Atrioventricular block, complete: Secondary | ICD-10-CM

## 2018-01-05 DIAGNOSIS — I5022 Chronic systolic (congestive) heart failure: Secondary | ICD-10-CM | POA: Diagnosis not present

## 2018-01-05 NOTE — Progress Notes (Signed)
Patient Care Team: [name], MD as PCP - General (Internal Medicine)   HPI  [name] Page is a 72 y.o. male Seen in followup for CRT-PM placement following AV ablation for permanent and rapid Afib years ago.  He underwent generator replacement 4/18    DATE TEST EF   7/18 Echo   55-60 %           Date Cr Hgb  4/18 0.8 14  Pending       The patient denies chest pain, shortness of breath, nocturnal dyspnea, orthopnea, with asymmetric peripheral edema.  There have been no palpitations, lightheadedness or syncope.     ; Records and Results Reviewed   Past Medical History:  Diagnosis Date  . Anal fissure   . Anal fistula   . Atrial fibrillation ([place])   . Blood transfusion without reported diagnosis   . Cardiomyopathy    Nonishemic, possibly tachycardia-mediated. 1997 LHC with no significant CAD. Myoview (7/09) showed EF 44% with possible mild inferior ischemia. Echo (9/10): EF 60%  . Cataract   . CHF (congestive heart failure) ([place])   . Chronic atrial fibrillation (HCC)    s/p AV nodal ablation and on Coumadin. Patient developed dyspnea/fatigue with RV  pacing and an LV lead was placed  . Colon polyp    hyperplastic and adenomatous  . HTN (hypertension)   . Hyperlipidemia   . Hyperplastic colon polyp    and adenomatous  . Osteoarthritis of right shoulder region   . Pacemaker    St. Jude CRT-P device  . Pseudogout   . Sleep apnea   . Upper GI bleed    With duodenal ulcer in 2005.  Patient was on ASA and coumadin at that time. ASA was stopped..    Past Surgical History:  Procedure Laterality Date  . [place] N/A 01/28/2017   Procedure: BiV Pacemaker Generator Changeout;  Surgeon: [name], MD;  Location: [facility];  Service: Cardiovascular;  Laterality: N/A;  . PACEMAKER INSERTION     St. Jude's  . SHOULDER SURGERY     Right  . TREATMENT FISTULA ANAL      Current Outpatient Medications  Medication Sig  Dispense Refill  . carvedilol (COREG) 25 MG tablet Take 25 mg by mouth 2 (two) times daily.      . colchicine 0.6 MG tablet Take 0.6 mg by mouth daily as needed (gout flare).     Marland Kitchen ELIQUIS 5 MG TABS tablet Take 5 mg by mouth 2 (two) times daily.    . furosemide (LASIX) 40 MG tablet Take 40 mg by mouth daily as needed (swelling).     . hydrochlorothiazide 25 MG tablet Take 25 mg by mouth daily.      . Multiple Vitamin (MULTIVITAMIN WITH MINERALS) TABS tablet Take 1 tablet by mouth daily.    Marland Kitchen omeprazole (PRILOSEC OTC) 20 MG tablet Take 20 mg by mouth daily.    . potassium chloride SA (K-DUR,KLOR-CON) 20 MEQ tablet Take 20 mEq by mouth daily as needed (When you take Lasix).     . rosuvastatin (CRESTOR) 40 MG tablet Take 20 mg by mouth daily with supper.     . traMADol (ULTRAM) 50 MG tablet Take 50 mg by mouth daily as needed for moderate pain.     . ramipril (ALTACE) 10 MG capsule Take 1 capsule (10 mg total) by mouth daily.     No current facility-administered medications for  this visit.     No Known Allergies    Review of Systems negative except from HPI and PMH  Physical Exam BP 124/78   Pulse 79   Ht 6\' 1"  (1.854 m)   Wt 251 lb (113.9 kg)   SpO2 97%   BMI 33.12 kg/m  Well developed and nourished in no acute distress HENT normal Neck supple with JVP-flat Carotids brisk and full without bruits Clear Regular rate and rhythm, no murmurs or gallops Abd-soft with active BS without hepatomegaly No Clubbing cyanosis R edema w support stocking in place Skin-warm and dry A & Oriented  Grossly normal sensory and motor function  ECG  Atrial fib P-synchronous/ AV  pacing Upright QRS V1 and neg 1  Assessment and  Plan Atrial fibrillation-permanent  Complete heart block status post AV ablation  Pacemaker-CRT-St. Jude  Asymmetric edema-    On Anticoagulation;  No bleeding issues   Euvolemic continue current meds  Continue current meds       Current medicines are  reviewed at length with the patient today .  The patient does not have concerns regarding medicines.\

## 2018-01-05 NOTE — Patient Instructions (Addendum)
Medication Instructions:  Your physician recommends that you continue on your current medications as directed. Please refer to the Current Medication list given to you today.  Labwork: None  Testing/Procedures: None ordered.  Follow-Up: Your physician recommends that you schedule a follow-up appointment in: One Year with Dr Caryl Comes  Remote monitoring is used to monitor your Pacemaker of ICD from home. This monitoring reduces the number of office visits required to check your device to one time per year. It allows [place] to keep an eye on the functioning of your device to ensure it is working properly. You are scheduled for a device check from home on 02/01/2018. You may send your transmission at any time that day. If you have a wireless device, the transmission will be sent automatically. After your physician reviews your transmission, you will receive a postcard with your next transmission date.   Any Other Special Instructions Will Be Listed Below (If Applicable).     If you need a refill on your cardiac medications before your next appointment, please call your pharmacy.

## 2018-01-11 DIAGNOSIS — G4733 Obstructive sleep apnea (adult) (pediatric): Secondary | ICD-10-CM | POA: Diagnosis not present

## 2018-02-01 ENCOUNTER — Encounter: Payer: Medicare HMO | Admitting: *Deleted

## 2018-02-01 ENCOUNTER — Telehealth: Payer: Self-pay | Admitting: Cardiology

## 2018-02-01 NOTE — Telephone Encounter (Signed)
LMOVM reminding pt to send remote transmission.   

## 2018-02-03 ENCOUNTER — Encounter: Payer: Self-pay | Admitting: Cardiology

## 2018-02-03 ENCOUNTER — Ambulatory Visit (INDEPENDENT_AMBULATORY_CARE_PROVIDER_SITE_OTHER): Payer: Medicare HMO | Admitting: *Deleted

## 2018-02-03 DIAGNOSIS — I442 Atrioventricular block, complete: Secondary | ICD-10-CM

## 2018-02-05 ENCOUNTER — Encounter: Payer: Self-pay | Admitting: Cardiology

## 2018-02-05 NOTE — Progress Notes (Signed)
Remote pacemaker transmission.   

## 2018-02-08 LAB — CUP PACEART REMOTE DEVICE CHECK
Date Time Interrogation Session: 20190429103023
Implantable Lead Implant Date: 20000217
Implantable Lead Location: 753860
Implantable Lead Model: 4196
MDC IDC LEAD IMPLANT DT: 20100414
MDC IDC LEAD LOCATION: 753858
MDC IDC PG IMPLANT DT: 20180418
MDC IDC PG SERIAL: 8013108

## 2018-03-16 DIAGNOSIS — I4891 Unspecified atrial fibrillation: Secondary | ICD-10-CM | POA: Diagnosis not present

## 2018-03-16 DIAGNOSIS — J302 Other seasonal allergic rhinitis: Secondary | ICD-10-CM | POA: Diagnosis not present

## 2018-03-16 DIAGNOSIS — Z7901 Long term (current) use of anticoagulants: Secondary | ICD-10-CM | POA: Diagnosis not present

## 2018-04-12 DIAGNOSIS — G4733 Obstructive sleep apnea (adult) (pediatric): Secondary | ICD-10-CM | POA: Diagnosis not present

## 2018-05-03 ENCOUNTER — Encounter: Payer: Self-pay | Admitting: Internal Medicine

## 2018-05-03 DIAGNOSIS — R82998 Other abnormal findings in urine: Secondary | ICD-10-CM | POA: Diagnosis not present

## 2018-05-03 DIAGNOSIS — I10 Essential (primary) hypertension: Secondary | ICD-10-CM | POA: Diagnosis not present

## 2018-05-03 DIAGNOSIS — E7849 Other hyperlipidemia: Secondary | ICD-10-CM | POA: Diagnosis not present

## 2018-05-03 DIAGNOSIS — Z125 Encounter for screening for malignant neoplasm of prostate: Secondary | ICD-10-CM | POA: Diagnosis not present

## 2018-05-05 ENCOUNTER — Telehealth: Payer: Self-pay | Admitting: Cardiology

## 2018-05-05 ENCOUNTER — Ambulatory Visit (INDEPENDENT_AMBULATORY_CARE_PROVIDER_SITE_OTHER): Payer: 59 | Admitting: *Deleted

## 2018-05-05 DIAGNOSIS — I442 Atrioventricular block, complete: Secondary | ICD-10-CM

## 2018-05-05 NOTE — Telephone Encounter (Signed)
Confirmed remote transmission w/ pt wife.   

## 2018-05-06 ENCOUNTER — Encounter: Payer: Self-pay | Admitting: Cardiology

## 2018-05-06 NOTE — Progress Notes (Signed)
Remote pacemaker transmission.   

## 2018-05-10 DIAGNOSIS — E7849 Other hyperlipidemia: Secondary | ICD-10-CM | POA: Diagnosis not present

## 2018-05-10 DIAGNOSIS — I872 Venous insufficiency (chronic) (peripheral): Secondary | ICD-10-CM | POA: Diagnosis not present

## 2018-05-10 DIAGNOSIS — Z950 Presence of cardiac pacemaker: Secondary | ICD-10-CM | POA: Diagnosis not present

## 2018-05-10 DIAGNOSIS — M25561 Pain in right knee: Secondary | ICD-10-CM | POA: Diagnosis not present

## 2018-05-10 DIAGNOSIS — Z0000 Encounter for general adult medical examination without abnormal findings: Secondary | ICD-10-CM | POA: Diagnosis not present

## 2018-05-10 DIAGNOSIS — E669 Obesity, unspecified: Secondary | ICD-10-CM | POA: Diagnosis not present

## 2018-05-10 DIAGNOSIS — I10 Essential (primary) hypertension: Secondary | ICD-10-CM | POA: Diagnosis not present

## 2018-05-10 DIAGNOSIS — I4891 Unspecified atrial fibrillation: Secondary | ICD-10-CM | POA: Diagnosis not present

## 2018-05-10 DIAGNOSIS — G4730 Sleep apnea, unspecified: Secondary | ICD-10-CM | POA: Diagnosis not present

## 2018-05-18 DIAGNOSIS — Z1212 Encounter for screening for malignant neoplasm of rectum: Secondary | ICD-10-CM | POA: Diagnosis not present

## 2018-06-14 LAB — CUP PACEART REMOTE DEVICE CHECK
Battery Remaining Longevity: 89 mo
Battery Remaining Percentage: 95.5 %
Implantable Lead Implant Date: 20000217
Implantable Lead Implant Date: 20100414
Implantable Lead Location: 753858
Implantable Lead Location: 753860
Implantable Pulse Generator Implant Date: 20180418
Lead Channel Impedance Value: 950 Ohm
Lead Channel Pacing Threshold Amplitude: 0.625 V
Lead Channel Pacing Threshold Amplitude: 0.75 V
Lead Channel Sensing Intrinsic Amplitude: 12 mV
Lead Channel Setting Pacing Amplitude: 2 V
Lead Channel Setting Pacing Pulse Width: 0.5 ms
MDC IDC MSMT BATTERY VOLTAGE: 2.99 V
MDC IDC MSMT LEADCHNL LV PACING THRESHOLD PULSEWIDTH: 0.5 ms
MDC IDC MSMT LEADCHNL RV IMPEDANCE VALUE: 390 Ohm
MDC IDC MSMT LEADCHNL RV PACING THRESHOLD PULSEWIDTH: 0.8 ms
MDC IDC PG SERIAL: 8013108
MDC IDC SESS DTM: 20190724195506
MDC IDC SET LEADCHNL LV PACING AMPLITUDE: 2.5 V
MDC IDC SET LEADCHNL RV PACING PULSEWIDTH: 0.8 ms
MDC IDC SET LEADCHNL RV SENSING SENSITIVITY: 4 mV

## 2018-06-22 DIAGNOSIS — Z23 Encounter for immunization: Secondary | ICD-10-CM | POA: Diagnosis not present

## 2018-07-13 DIAGNOSIS — G4733 Obstructive sleep apnea (adult) (pediatric): Secondary | ICD-10-CM | POA: Diagnosis not present

## 2018-07-15 ENCOUNTER — Ambulatory Visit: Payer: Medicare HMO | Admitting: Neurology

## 2018-07-15 ENCOUNTER — Encounter: Payer: Self-pay | Admitting: Neurology

## 2018-07-15 VITALS — BP 128/78 | HR 92 | Ht 73.0 in | Wt 253.0 lb

## 2018-07-15 DIAGNOSIS — G4733 Obstructive sleep apnea (adult) (pediatric): Secondary | ICD-10-CM

## 2018-07-15 DIAGNOSIS — Z9989 Dependence on other enabling machines and devices: Secondary | ICD-10-CM | POA: Diagnosis not present

## 2018-07-15 NOTE — Patient Instructions (Addendum)
Please continue using your CPAP regularly. While your insurance requires that you use CPAP at least 4 hours each night on 70% of the nights, I recommend, that you not skip any nights and use it throughout the night if you can. Getting used to CPAP and staying with the treatment long term does take time and patience and discipline. Untreated obstructive sleep apnea when it is moderate to severe can have an adverse impact on cardiovascular health and raise her risk for heart disease, arrhythmias, hypertension, congestive heart failure, stroke and diabetes. Untreated obstructive sleep apnea causes sleep disruption, nonrestorative sleep, and sleep deprivation. This can have an impact on your day to day functioning and cause daytime sleepiness and impairment of cognitive function, memory loss, mood disturbance, and problems focussing. Using CPAP regularly can improve these symptoms.  Keep up the good work! I will see you back in one year! 

## 2018-07-15 NOTE — Progress Notes (Signed)
Subjective:    Patient ID: [name] Page is a 72 y.o. male.  HPI     Interim history:   [name] Page is a very pleasant 72 year old right-handed gentleman with an underlying medical history of hyperlipidemia, arthritis, A. fib, s/p ablation in 2000, status post pacemaker placement, obesity, allergic rhinitis, BPH, who presents for followup consultation of his obstructive sleep apnea, well established on treatment with CPAP therapy. The patient is unaccompanied today. I last saw him on 07/09/2017, at which time he was fully compliant with CPAP therapy and was doing well, he had an interim pacemaker generator change in April 2018 without problems. He was advised to follow-up routinely in one year.   Today, 07/15/2018: I reviewed his CPAP compliance data from 06/14/2018 through 07/13/2018 which is a total of 30 days, during which time he used his CPAP every night with percent used days greater than 4 hours at 100%, indicating superb compliance with an average usage of 7 hours and 28 minutes, residual AHI at goal at 0.8 per hour, leak on the low side with the 95th percentile at 5.2 L/m, pressure of 9 cm with EPR of 2. He reports doing well, continues to be fully compliant with treatment and continues to benefit from it. He had noticed improvement in his nighttime sleep consolidation, sleep quality and daytime somnolence after starting CPAP therapy. He just got all new supplies. He continues to stay active physically, likes to play golf and use his elliptical machine. He hydrates well with water. His DME company is [employer].   The patient's allergies, current medications, family history, past medical history, past social history, past surgical history and problem list were reviewed and updated as appropriate.    Previously (copied from previous notes for reference):      I saw him on 07/07/2016, at which time he was fully compliant with his CPAP, doing well. He was physically active, he had no telltale  restless leg symptoms. I suggested a one-year checkup.   I reviewed his CPAP compliance data from 06/08/2017 through 07/07/2017 which is a total of 30 days, during which time he was 100 percent compliant with his CPAP, average usage of 7 hours and 11 minutes, AHI 0.5, leak acceptable for the 95th percentile at 10.8 L/m on a pressure of 9 cm with EPR of 2.   I saw him on 07/05/2015, at which time he reported doing well. He was fully compliant with CPAP therapy and his exam was stable. He had stopped taking Coumadin and was on Eliquis. He requested heated tubing for his CPAP machine but otherwise left his CPAP. I suggested a one-year checkup.    I reviewed his CPAP compliance data from 06/03/2016 through 07/02/2016, which is a total of 30 days, during which time he used his machine every night with percent used days greater than 4 hours at 100%, indicating superb compliance with an average usage of 7 hours and 25 minutes, residual AHI low at 0.7 per hour, leak acceptable with the 95th percentile at 12.5 L/m on a pressure of 9 cm with EPR of 2.   I saw him on 01/02/2015, at which time he reported treatment compliance with CPAP. He changed from advanced home care to [place]. He retired in October 2015 and has been playing more golf. He uses his elliptical daily for about 30 minutes.   I reviewed his CPAP compliance data from 06/04/2015 through 07/03/2015 which is a total of 30 days during which time he used his  machine every night with percent used days greater than 4 hours at 100%, indicating superb compliance with an average usage of 7 hours and 13 minutes, residual AHI low at 0.9 per hour, leak acceptable with the 95th percentile at 14.8 L/m on a pressure of 9 cm with EPR of 2.   I saw him on 06/29/2014, at which time he reported doing much better. He felt, he was sleeping better, his wife was pleased with how he was sleeping, his daytime somnolence and sleep consolidation were improved. He denied any  restless leg symptoms and was not bothered by any leg movements or twitching at night. Overall he had done really well and was very pleased. He was completely compliant with treatment and had no issues adjusting to the machine and the mask. He was using nasal pillows and liked them.   I reviewed his CPAP compliance data from 12/02/2014 through 12/31/2014 which is a total of 30 days during which time he used his machine 28 days with percent used days greater than 4 hours of only 50%, indicating suboptimal compliance with an average usage of 3 hours and 59 minutes. 95th percentile of pressure was 10.2. Leak acceptable with the 95th percentile of leak at 12.9 L/m. Residual AHI at 3.8 per hour.    I first met him on 04/04/2014 at the request of his primary care physician, at which time the patient reported snoring, significant daytime somnolence and witnessed apneas per wife. I invited him back for sleep study. He had a split-night sleep study on 04/19/2014, and I went over his test results with him in detail today. Baseline sleep efficiency was significantly reduced at 53.9% with a long latency to sleep of 39.5 minutes and wake after sleep onset of 67.5 minutes with moderate sleep fragmentation noted. He had an elevated arousal index. He had an increased percentage of stage I and stage II sleep as well as absence of slow-wave and REM sleep. He had no significant periodic leg movements and EKG showed known A. fib. He had a paced cardiac rhythm. His total AHI was elevated at 25 per hour. Baseline oxygen saturation was 93%, nadir was 73%. He was then titrated on CPAP. Sleep efficiency was 67%. He had a absence of deep sleep but increased percentage of dream sleep at 47.1% after CPAP. His average oxygen saturation was 93% with a nadir of 89%. He was titrated from 5-9 cm with a reduction of his AHI to 0 events per hour at the final pressure with supine REM sleep achieved. He had severe periodic leg movements during the  treatment portion of the study but no significant arousals. Based on the test results I prescribed CPAP for him.   I reviewed the patient's CPAP compliance data from 05/16/2014 to 06/14/2014, which is a total of 30 days, during which time the patient used CPAP every day. The average usage for all days was 7 hours and 23 minutes. The percent used days greater than 4 hours was 100 %, indicating superb compliance. The residual AHI was 0.6 per hour, indicating an appropriate treatment pressure of 9 cwp with EPR of 2. Air leak from the mask was very low at 3 L per minute at the 95th percentile.  I reviewed his compliance data from 05/29/2014 through 06/27/2014 which is a total of 30 days during which time he used his machine every night with percent used days greater than 4 hours of 100%, again indicating flawless compliance. His average usage was 7 hours  and 25 minutes. His residual AHI low at 0.3 with leak very low at 0.8 at the current pressure of 9 cm with EPR of 2.  His Past Medical History Is Significant For: Past Medical History:  Diagnosis Date  . Anal fissure   . Anal fistula   . Atrial fibrillation ([place])   . Blood transfusion without reported diagnosis   . Cardiomyopathy    Nonishemic, possibly tachycardia-mediated. 1997 LHC with no significant CAD. Myoview (7/09) showed EF 44% with possible mild inferior ischemia. Echo (9/10): EF 60%  . Cataract   . CHF (congestive heart failure) ([place])   . Chronic atrial fibrillation    s/p AV nodal ablation and on Coumadin. Patient developed dyspnea/fatigue with RV  pacing and an LV lead was placed  . Colon polyp    hyperplastic and adenomatous  . HTN (hypertension)   . Hyperlipidemia   . Hyperplastic colon polyp    and adenomatous  . Osteoarthritis of right shoulder region   . Pacemaker    St. Jude CRT-P device  . Pseudogout   . Sleep apnea   . Upper GI bleed    With duodenal ulcer in 2005.  Patient was on ASA and coumadin at that time. ASA was  stopped.Marland Kitchen    His Past Surgical History Is Significant For: Past Surgical History:  Procedure Laterality Date  . [place] N/A 01/28/2017   Procedure: BiV Pacemaker Generator Changeout;  Surgeon: [name], MD;  Location: [facility];  Service: Cardiovascular;  Laterality: N/A;  . PACEMAKER INSERTION     St. Jude's  . SHOULDER SURGERY     Right  . TREATMENT FISTULA ANAL      His Family History Is Significant For: Family History  Problem Relation Age of Onset  . Heart disease Mother   . Stroke Father   . Diverticulitis Sister   . Colon cancer Neg Hx     His Social History Is Significant For: Social History   Socioeconomic History  . Marital status: Married    Spouse name: Di Kindle  . Number of children: 0  . Years of education: some coll.  . Highest education level: Not on file  Occupational History  . Occupation: Scientist, clinical (histocompatibility and immunogenetics): [employer]  . Financial resource strain: Not on file  . Food insecurity:    Worry: Not on file    Inability: Not on file  . Transportation needs:    Medical: Not on file    Non-medical: Not on file  Tobacco Use  . Smoking status: Never Smoker  . Smokeless tobacco: Never Used  Substance and Sexual Activity  . Alcohol use: No    Alcohol/week: 0.0 standard drinks    Comment: Very rare  . Drug use: No  . Sexual activity: Not on file  Lifestyle  . Physical activity:    Days per week: Not on file    Minutes per session: Not on file  . Stress: Not on file  Relationships  . Social connections:    Talks on phone: Not on file    Gets together: Not on file    Attends religious service: Not on file    Active member of club or organization: Not on file    Attends meetings of clubs or organizations: Not on file    Relationship status: Not on file  Other Topics Concern  . Not on file  Social History Narrative   Lives  in [place].   Does a lot of traveling.   Exercises daily.    Daily Caffeine use.   Patient is right handed    His Allergies Are:  No Known Allergies:   His Current Medications Are:  Outpatient Encounter Medications as of 07/15/2018  Medication Sig  . carvedilol (COREG) 25 MG tablet Take 25 mg by mouth 2 (two) times daily.    . colchicine 0.6 MG tablet Take 0.6 mg by mouth daily as needed (gout flare).   Marland Kitchen ELIQUIS 5 MG TABS tablet Take 5 mg by mouth 2 (two) times daily.  . furosemide (LASIX) 40 MG tablet Take 40 mg by mouth daily as needed (swelling).   . hydrochlorothiazide 25 MG tablet Take 25 mg by mouth daily.    . Multiple Vitamin (MULTIVITAMIN WITH MINERALS) TABS tablet Take 1 tablet by mouth daily.  Marland Kitchen omeprazole (PRILOSEC OTC) 20 MG tablet Take 20 mg by mouth daily.  . potassium chloride SA (K-DUR,KLOR-CON) 20 MEQ tablet Take 20 mEq by mouth daily as needed (When you take Lasix).   . rosuvastatin (CRESTOR) 40 MG tablet Take 20 mg by mouth daily with supper.   . traMADol (ULTRAM) 50 MG tablet Take 50 mg by mouth daily as needed for moderate pain.   . ramipril (ALTACE) 10 MG capsule Take 1 capsule (10 mg total) by mouth daily.   No facility-administered encounter medications on file as of 07/15/2018.   :  Review of Systems:  Out of a complete 14 point review of systems, all are reviewed and negative with the exception of these symptoms as listed below:  Review of Systems  Neurological:       Patient reports that he is doing very well.     Objective:  Neurological Exam  Physical Exam Physical Examination:   Vitals:   07/15/18 1351  BP: 128/78  Pulse: 92   General Examination: The patient is a very pleasant 71 y.o. male in no acute distress. He appears well-developed and well-nourished and well groomed.   HEENT:Normocephalic, atraumatic, pupils are equal, round and reactive to light and accommodation. Extraocular tracking is good without limitation to gaze excursion or nystagmus noted. Normal smooth pursuit is noted. Hearing  is grossly intact. Face is symmetric with normal facial animation and normal facial sensation. Speech is clear with no dysarthria noted. There is no hypophonia. There is no lip, neck/head, jaw or voice tremor. Neck with FROM. Oropharynx exam reveals: mild mouth dryness, adequate dental hygiene and moderate airway crowding, due to narrow airway entry and redundant soft palate and larger uvula and wider tongue base. Mallampati is class II. Tongue protrudes centrally and palate elevates symmetrically. Tonsils are absent.   Chest:Clear to auscultation without wheezing, rhonchi or crackles noted.  Heart:S1+S2+0, regular and normal without murmurs, rubs or gallops noted.   Abdomen:Soft, non-tender and non-distended with normal bowel sounds appreciated on auscultation.  Extremities:There is 1+ pitting edema in the distal lower extremities bilaterally mild erythema is noted distally bilaterally in the legs, chronic appearing discoloration and hyperpigmentation, all stable.  Skin: Warm and dry without trophic changes noted, chronic appearing changes distal legs. There are no varicose veins.  Musculoskeletal: exam reveals no obvious joint deformities, tenderness or joint swelling or erythema.   Neurologically:  Mental status: The patient is awake, alert and oriented in all 4 spheres. His immediate and remote memory, attention, language skills and fund of knowledge are appropriate. There is no evidence of aphasia, agnosia, apraxia or anomia. Speech is  clear with normal prosody and enunciation. Thought process is linear. Mood is normal and affect is normal.  Cranial nerves II - XII are as described above under HEENT exam. Motor exam: Normal bulk, strength and tone is noted. There is no tremor. Fine motor skills and coordination: grossly intact.  Cerebellar testing: No dysmetria or intention tremor on finger to nose testing. There is no truncal or gait ataxia.  Sensory exam: intact to light touch in  the upper and lower extremities.  Gait, station and balance: He stands easily. No veering to one side is noted. No leaning to one side is noted. Posture is age-appropriate and stance is narrow based. Gait shows normal stride length and normal pace. No problems turning are noted. Tandem walk is unremarkable for age.  Assessment and Plan:   In summary, [name] Page is a very pleasant 72 year old male with an underlying medical history of hyperlipidemia, arthritis, A. fib, status post pacemaker placement (and generator replacement in April 2018), obesity, allergic rhinitis, and BPH, who presents for followup consultation of his obstructive sleep apnea, established on CPAP of 9 cm. He continues to be fully compliant with treatment and reports ongoing good results. I renewed his prescription for CPAP related supplies. He is up to date with the supplies. Of note, he had a split-night sleep study on 04/19/14, which showed moderate to severe obstructive sleep apnea.We reviewed his most recent compliance data. DME is [employer]. Physical exam is stable. I asked him to follow-up routinely in one year. I answered all his questions today and he was in agreement. I spent 20 minutes in total face-to-face time with the patient, more than 50% of which was spent in counseling and coordination of care, reviewing test results, reviewing medication and discussing or reviewing the diagnosis of OSA, its prognosis and treatment options. Pertinent laboratory and imaging test results that were available during this visit with the patient were reviewed by me and considered in my medical decision making (see chart for details).

## 2018-07-15 NOTE — Progress Notes (Addendum)
Order for supplies sent to [facility].

## 2018-08-04 ENCOUNTER — Ambulatory Visit (INDEPENDENT_AMBULATORY_CARE_PROVIDER_SITE_OTHER): Payer: Medicare HMO | Admitting: *Deleted

## 2018-08-04 ENCOUNTER — Telehealth: Payer: Self-pay

## 2018-08-04 DIAGNOSIS — I442 Atrioventricular block, complete: Secondary | ICD-10-CM | POA: Diagnosis not present

## 2018-08-04 NOTE — Telephone Encounter (Signed)
Confirmed remote transmission w/ pt wife.   

## 2018-08-05 ENCOUNTER — Encounter: Payer: Self-pay | Admitting: Cardiology

## 2018-08-05 NOTE — Progress Notes (Signed)
Remote pacemaker transmission.   

## 2018-09-14 DIAGNOSIS — I4891 Unspecified atrial fibrillation: Secondary | ICD-10-CM | POA: Diagnosis not present

## 2018-09-14 DIAGNOSIS — Z7901 Long term (current) use of anticoagulants: Secondary | ICD-10-CM | POA: Diagnosis not present

## 2018-10-09 LAB — CUP PACEART REMOTE DEVICE CHECK
Battery Remaining Longevity: 92 mo
Battery Remaining Percentage: 95.5 %
Battery Voltage: 2.99 V
Implantable Lead Implant Date: 20100414
Implantable Lead Location: 753860
Implantable Lead Model: 4196
Implantable Pulse Generator Implant Date: 20180418
Lead Channel Impedance Value: 950 Ohm
Lead Channel Pacing Threshold Amplitude: 0.75 V
Lead Channel Pacing Threshold Pulse Width: 0.5 ms
Lead Channel Setting Pacing Pulse Width: 0.8 ms
Lead Channel Setting Sensing Sensitivity: 4 mV
MDC IDC LEAD IMPLANT DT: 20000217
MDC IDC LEAD LOCATION: 753858
MDC IDC MSMT LEADCHNL RV IMPEDANCE VALUE: 410 Ohm
MDC IDC MSMT LEADCHNL RV PACING THRESHOLD AMPLITUDE: 0.75 V
MDC IDC MSMT LEADCHNL RV PACING THRESHOLD PULSEWIDTH: 0.8 ms
MDC IDC MSMT LEADCHNL RV SENSING INTR AMPL: 12 mV
MDC IDC SESS DTM: 20191023203406
MDC IDC SET LEADCHNL LV PACING AMPLITUDE: 2.5 V
MDC IDC SET LEADCHNL LV PACING PULSEWIDTH: 0.5 ms
MDC IDC SET LEADCHNL RV PACING AMPLITUDE: 2 V
Pulse Gen Model: 3222
Pulse Gen Serial Number: 8013108

## 2018-11-03 ENCOUNTER — Ambulatory Visit (INDEPENDENT_AMBULATORY_CARE_PROVIDER_SITE_OTHER): Payer: PPO

## 2018-11-03 DIAGNOSIS — I4821 Permanent atrial fibrillation: Secondary | ICD-10-CM | POA: Diagnosis not present

## 2018-11-03 DIAGNOSIS — I495 Sick sinus syndrome: Secondary | ICD-10-CM

## 2018-11-04 LAB — CUP PACEART REMOTE DEVICE CHECK
Battery Remaining Longevity: 89 mo
Battery Voltage: 2.99 V
Date Time Interrogation Session: 20200122143027
Implantable Lead Implant Date: 20000217
Implantable Lead Location: 753858
Implantable Lead Location: 753860
Lead Channel Impedance Value: 930 Ohm
Lead Channel Pacing Threshold Amplitude: 0.75 V
MDC IDC LEAD IMPLANT DT: 20100414
MDC IDC MSMT BATTERY REMAINING PERCENTAGE: 95.5 %
MDC IDC MSMT LEADCHNL LV PACING THRESHOLD PULSEWIDTH: 0.5 ms
MDC IDC MSMT LEADCHNL RV IMPEDANCE VALUE: 390 Ohm
MDC IDC MSMT LEADCHNL RV PACING THRESHOLD AMPLITUDE: 0.875 V
MDC IDC MSMT LEADCHNL RV PACING THRESHOLD PULSEWIDTH: 0.8 ms
MDC IDC MSMT LEADCHNL RV SENSING INTR AMPL: 7.8 mV
MDC IDC PG IMPLANT DT: 20180418
MDC IDC SET LEADCHNL LV PACING AMPLITUDE: 2.5 V
MDC IDC SET LEADCHNL LV PACING PULSEWIDTH: 0.5 ms
MDC IDC SET LEADCHNL RV PACING AMPLITUDE: 2 V
MDC IDC SET LEADCHNL RV PACING PULSEWIDTH: 0.8 ms
MDC IDC SET LEADCHNL RV SENSING SENSITIVITY: 4 mV
Pulse Gen Model: 3222
Pulse Gen Serial Number: 8013108

## 2018-11-04 NOTE — Progress Notes (Signed)
Remote pacemaker transmission.   

## 2018-11-05 ENCOUNTER — Encounter: Payer: Self-pay | Admitting: Cardiology

## 2018-11-05 DIAGNOSIS — G4733 Obstructive sleep apnea (adult) (pediatric): Secondary | ICD-10-CM | POA: Diagnosis not present

## 2018-12-06 DIAGNOSIS — H353131 Nonexudative age-related macular degeneration, bilateral, early dry stage: Secondary | ICD-10-CM | POA: Diagnosis not present

## 2018-12-06 DIAGNOSIS — H2513 Age-related nuclear cataract, bilateral: Secondary | ICD-10-CM | POA: Diagnosis not present

## 2018-12-06 DIAGNOSIS — H35033 Hypertensive retinopathy, bilateral: Secondary | ICD-10-CM | POA: Diagnosis not present

## 2018-12-06 DIAGNOSIS — H25013 Cortical age-related cataract, bilateral: Secondary | ICD-10-CM | POA: Diagnosis not present

## 2019-02-02 ENCOUNTER — Ambulatory Visit (INDEPENDENT_AMBULATORY_CARE_PROVIDER_SITE_OTHER): Payer: PPO | Admitting: *Deleted

## 2019-02-02 ENCOUNTER — Other Ambulatory Visit: Payer: Self-pay

## 2019-02-02 DIAGNOSIS — I442 Atrioventricular block, complete: Secondary | ICD-10-CM | POA: Diagnosis not present

## 2019-02-02 LAB — CUP PACEART REMOTE DEVICE CHECK
Battery Remaining Longevity: 91 mo
Battery Remaining Percentage: 95.5 %
Battery Voltage: 2.98 V
Date Time Interrogation Session: 20200422113354
Implantable Lead Implant Date: 20000217
Implantable Lead Implant Date: 20100414
Implantable Lead Location: 753858
Implantable Lead Location: 753860
Implantable Lead Model: 4196
Implantable Pulse Generator Implant Date: 20180418
Lead Channel Impedance Value: 1000 Ohm
Lead Channel Impedance Value: 410 Ohm
Lead Channel Pacing Threshold Amplitude: 0.75 V
Lead Channel Pacing Threshold Amplitude: 0.75 V
Lead Channel Pacing Threshold Pulse Width: 0.5 ms
Lead Channel Pacing Threshold Pulse Width: 0.8 ms
Lead Channel Sensing Intrinsic Amplitude: 7.8 mV
Lead Channel Setting Pacing Amplitude: 2 V
Lead Channel Setting Pacing Amplitude: 2.5 V
Lead Channel Setting Pacing Pulse Width: 0.5 ms
Lead Channel Setting Pacing Pulse Width: 0.8 ms
Lead Channel Setting Sensing Sensitivity: 4 mV
Pulse Gen Model: 3222
Pulse Gen Serial Number: 8013108

## 2019-02-04 DIAGNOSIS — G4733 Obstructive sleep apnea (adult) (pediatric): Secondary | ICD-10-CM | POA: Diagnosis not present

## 2019-02-10 ENCOUNTER — Encounter: Payer: Self-pay | Admitting: Cardiology

## 2019-02-10 NOTE — Progress Notes (Signed)
Remote pacemaker transmission.   

## 2019-03-15 DIAGNOSIS — Z7901 Long term (current) use of anticoagulants: Secondary | ICD-10-CM | POA: Diagnosis not present

## 2019-03-15 DIAGNOSIS — I10 Essential (primary) hypertension: Secondary | ICD-10-CM | POA: Diagnosis not present

## 2019-03-15 DIAGNOSIS — M1990 Unspecified osteoarthritis, unspecified site: Secondary | ICD-10-CM | POA: Diagnosis not present

## 2019-03-15 DIAGNOSIS — I4891 Unspecified atrial fibrillation: Secondary | ICD-10-CM | POA: Diagnosis not present

## 2019-05-04 ENCOUNTER — Ambulatory Visit (INDEPENDENT_AMBULATORY_CARE_PROVIDER_SITE_OTHER): Payer: PPO | Admitting: *Deleted

## 2019-05-04 DIAGNOSIS — I442 Atrioventricular block, complete: Secondary | ICD-10-CM

## 2019-05-04 DIAGNOSIS — I4821 Permanent atrial fibrillation: Secondary | ICD-10-CM

## 2019-05-04 LAB — CUP PACEART REMOTE DEVICE CHECK
Date Time Interrogation Session: 20200722121751
Implantable Lead Implant Date: 20000217
Implantable Lead Implant Date: 20100414
Implantable Lead Location: 753858
Implantable Lead Location: 753860
Implantable Lead Model: 4196
Implantable Pulse Generator Implant Date: 20180418
Pulse Gen Model: 3222
Pulse Gen Serial Number: 8013108

## 2019-05-06 DIAGNOSIS — G4733 Obstructive sleep apnea (adult) (pediatric): Secondary | ICD-10-CM | POA: Diagnosis not present

## 2019-05-12 DIAGNOSIS — M109 Gout, unspecified: Secondary | ICD-10-CM | POA: Diagnosis not present

## 2019-05-12 DIAGNOSIS — I4891 Unspecified atrial fibrillation: Secondary | ICD-10-CM | POA: Diagnosis not present

## 2019-05-18 NOTE — Progress Notes (Signed)
Remote pacemaker transmission.   

## 2019-06-28 DIAGNOSIS — I4891 Unspecified atrial fibrillation: Secondary | ICD-10-CM | POA: Diagnosis not present

## 2019-06-28 DIAGNOSIS — M542 Cervicalgia: Secondary | ICD-10-CM | POA: Diagnosis not present

## 2019-06-30 DIAGNOSIS — Z125 Encounter for screening for malignant neoplasm of prostate: Secondary | ICD-10-CM | POA: Diagnosis not present

## 2019-06-30 DIAGNOSIS — E7849 Other hyperlipidemia: Secondary | ICD-10-CM | POA: Diagnosis not present

## 2019-06-30 DIAGNOSIS — M109 Gout, unspecified: Secondary | ICD-10-CM | POA: Diagnosis not present

## 2019-06-30 DIAGNOSIS — Z23 Encounter for immunization: Secondary | ICD-10-CM | POA: Diagnosis not present

## 2019-06-30 DIAGNOSIS — M25512 Pain in left shoulder: Secondary | ICD-10-CM | POA: Diagnosis not present

## 2019-07-04 DIAGNOSIS — E785 Hyperlipidemia, unspecified: Secondary | ICD-10-CM | POA: Diagnosis not present

## 2019-07-04 DIAGNOSIS — Z950 Presence of cardiac pacemaker: Secondary | ICD-10-CM | POA: Diagnosis not present

## 2019-07-04 DIAGNOSIS — I10 Essential (primary) hypertension: Secondary | ICD-10-CM | POA: Diagnosis not present

## 2019-07-04 DIAGNOSIS — G4730 Sleep apnea, unspecified: Secondary | ICD-10-CM | POA: Diagnosis not present

## 2019-07-04 DIAGNOSIS — Z7901 Long term (current) use of anticoagulants: Secondary | ICD-10-CM | POA: Diagnosis not present

## 2019-07-04 DIAGNOSIS — M542 Cervicalgia: Secondary | ICD-10-CM | POA: Diagnosis not present

## 2019-07-04 DIAGNOSIS — Z0000 Encounter for general adult medical examination without abnormal findings: Secondary | ICD-10-CM | POA: Diagnosis not present

## 2019-07-04 DIAGNOSIS — M1990 Unspecified osteoarthritis, unspecified site: Secondary | ICD-10-CM | POA: Diagnosis not present

## 2019-07-04 DIAGNOSIS — I872 Venous insufficiency (chronic) (peripheral): Secondary | ICD-10-CM | POA: Diagnosis not present

## 2019-07-04 DIAGNOSIS — I4891 Unspecified atrial fibrillation: Secondary | ICD-10-CM | POA: Diagnosis not present

## 2019-07-04 DIAGNOSIS — M25561 Pain in right knee: Secondary | ICD-10-CM | POA: Diagnosis not present

## 2019-07-04 DIAGNOSIS — M109 Gout, unspecified: Secondary | ICD-10-CM | POA: Diagnosis not present

## 2019-07-05 DIAGNOSIS — R82998 Other abnormal findings in urine: Secondary | ICD-10-CM | POA: Diagnosis not present

## 2019-07-07 DIAGNOSIS — Z1212 Encounter for screening for malignant neoplasm of rectum: Secondary | ICD-10-CM | POA: Diagnosis not present

## 2019-07-07 DIAGNOSIS — M25512 Pain in left shoulder: Secondary | ICD-10-CM | POA: Diagnosis not present

## 2019-07-14 ENCOUNTER — Telehealth: Payer: Self-pay | Admitting: *Deleted

## 2019-07-14 NOTE — Telephone Encounter (Signed)
He is scheduled for an appointment on Monday but wonders with COVID is it really necessary for him to come in?  Please call.

## 2019-07-14 NOTE — Telephone Encounter (Signed)
I called pt. He does not want to come to the office at this time. I offered him a virtual visit but he declined that. Pt would prefer to delay his appt until January. New appt made. I did advise pt that he will need to be seen at least yearly for cpap orders. Pt verbalized understanding of this and of new appt date and time.

## 2019-07-18 ENCOUNTER — Ambulatory Visit: Payer: Medicare HMO | Admitting: Neurology

## 2019-08-04 ENCOUNTER — Ambulatory Visit (INDEPENDENT_AMBULATORY_CARE_PROVIDER_SITE_OTHER): Payer: PPO | Admitting: *Deleted

## 2019-08-04 DIAGNOSIS — I4821 Permanent atrial fibrillation: Secondary | ICD-10-CM

## 2019-08-04 DIAGNOSIS — I5022 Chronic systolic (congestive) heart failure: Secondary | ICD-10-CM | POA: Diagnosis not present

## 2019-08-04 LAB — CUP PACEART REMOTE DEVICE CHECK
Battery Remaining Longevity: 94 mo
Battery Remaining Percentage: 95.5 %
Battery Voltage: 2.98 V
Brady Statistic RV Percent Paced: 99 %
Date Time Interrogation Session: 20201022095707
Implantable Lead Implant Date: 20000217
Implantable Lead Implant Date: 20100414
Implantable Lead Location: 753858
Implantable Lead Location: 753860
Implantable Lead Model: 4196
Implantable Pulse Generator Implant Date: 20180418
Lead Channel Impedance Value: 1000 Ohm
Lead Channel Impedance Value: 490 Ohm
Lead Channel Pacing Threshold Amplitude: 0.75 V
Lead Channel Pacing Threshold Amplitude: 0.75 V
Lead Channel Pacing Threshold Pulse Width: 0.5 ms
Lead Channel Pacing Threshold Pulse Width: 0.8 ms
Lead Channel Sensing Intrinsic Amplitude: 11.4 mV
Lead Channel Setting Pacing Amplitude: 2 V
Lead Channel Setting Pacing Amplitude: 2.5 V
Lead Channel Setting Pacing Pulse Width: 0.5 ms
Lead Channel Setting Pacing Pulse Width: 0.8 ms
Lead Channel Setting Sensing Sensitivity: 4 mV
Pulse Gen Model: 3222
Pulse Gen Serial Number: 8013108

## 2019-08-08 DIAGNOSIS — G4733 Obstructive sleep apnea (adult) (pediatric): Secondary | ICD-10-CM | POA: Diagnosis not present

## 2019-08-15 NOTE — Progress Notes (Signed)
Remote pacemaker transmission.   

## 2019-09-07 DIAGNOSIS — G4733 Obstructive sleep apnea (adult) (pediatric): Secondary | ICD-10-CM | POA: Diagnosis not present

## 2019-09-20 DIAGNOSIS — I4891 Unspecified atrial fibrillation: Secondary | ICD-10-CM | POA: Diagnosis not present

## 2019-09-20 DIAGNOSIS — Z7901 Long term (current) use of anticoagulants: Secondary | ICD-10-CM | POA: Diagnosis not present

## 2019-10-07 DIAGNOSIS — G4733 Obstructive sleep apnea (adult) (pediatric): Secondary | ICD-10-CM | POA: Diagnosis not present

## 2019-11-01 ENCOUNTER — Ambulatory Visit: Payer: PPO | Admitting: Neurology

## 2019-11-01 ENCOUNTER — Encounter: Payer: Self-pay | Admitting: Neurology

## 2019-11-01 ENCOUNTER — Other Ambulatory Visit: Payer: Self-pay

## 2019-11-01 VITALS — BP 136/77 | HR 90 | Temp 96.8°F | Ht 74.0 in | Wt 256.0 lb

## 2019-11-01 DIAGNOSIS — Z9989 Dependence on other enabling machines and devices: Secondary | ICD-10-CM

## 2019-11-01 DIAGNOSIS — G4733 Obstructive sleep apnea (adult) (pediatric): Secondary | ICD-10-CM | POA: Diagnosis not present

## 2019-11-01 NOTE — Patient Instructions (Signed)
Please continue using your CPAP regularly. While your insurance requires that you use CPAP at least 4 hours each night on 70% of the nights, I recommend, that you not skip any nights and use it throughout the night if you can. Getting used to CPAP and staying with the treatment long term does take time and patience and discipline. Untreated obstructive sleep apnea when it is moderate to severe can have an adverse impact on cardiovascular health and raise her risk for heart disease, arrhythmias, hypertension, congestive heart failure, stroke and diabetes. Untreated obstructive sleep apnea causes sleep disruption, nonrestorative sleep, and sleep deprivation. This can have an impact on your day to day functioning and cause daytime sleepiness and impairment of cognitive function, memory loss, mood disturbance, and problems focussing. Using CPAP regularly can improve these symptoms.  Keep up the good work! I will see you back in one year for sleep apnea check up.

## 2019-11-01 NOTE — Progress Notes (Signed)
Subjective:    Patient ID: [name] Page is a 74 y.o. male.  HPI     Interim history:   [name] Page is a very pleasant 74 year old right-handed gentleman with an underlying medical history of hyperlipidemia, arthritis, A. fib, s/p ablation in 2000, status post pacemaker placement, obesity, allergic rhinitis, BPH, who presents for followup consultation of his obstructive sleep apnea, well established on treatment with CPAP therapy. The patient is unaccompanied today and presents for his yearly checkup.  I last saw him on 07/15/2018, at which time he was fully compliant with his CPAP and was doing well.  He opted to delay his follow-up appointment from October 2020.   Today, 11/01/2019: I reviewed his CPAP compliance data from 10/01/2019 through 10/30/2019, which is a total of 30 days, during which time he used his machine every night with percent use days greater than 4 hours at 100%, indicating superb compliance with an average usage of 7 hours and 33 minutes, residual AHI at goal at 0.3/h, leak low with a 95th percentile at 5 L/min on a pressure of 9 cm with EPR of 2.  He reports Doing well with his CPAP, he continues to be fully compliant with it.  He reports no issues, uses nasal pillows and is generally up-to-date with his supplies through [facility].  He has some mouth dryness at night at times but tries to use the humidifier consistently and tries to hydrate well with water.  He is hoping to get the COVID-19 vaccination soon.  He had family members affected with the Covid virus, his older sister in [place] is still recuperating from the infection and sadly he lost his oldest sister from complications of the virus, she had been in a nursing home.   The patient's allergies, current medications, family history, past medical history, past social history, past surgical history and problem list were reviewed and updated as appropriate.    Previously (copied from previous notes for reference):        I  saw him on 07/09/2017, at which time he was fully compliant with CPAP therapy and was doing well, he had an interim pacemaker generator change in April 2018 without problems. He was advised to follow-up routinely in one year.   I reviewed his CPAP compliance data from 06/14/2018 through 07/13/2018 which is a total of 30 days, during which time he used his CPAP every night with percent used days greater than 4 hours at 100%, indicating superb compliance with an average usage of 7 hours and 28 minutes, residual AHI at goal at 0.8 per hour, leak on the low side with the 95th percentile at 5.2 L/m, pressure of 9 cm with EPR of 2.    I saw him on 07/07/2016, at which time he was fully compliant with his CPAP, doing well. He was physically active, he had no telltale restless leg symptoms. I suggested a one-year checkup.   I reviewed his CPAP compliance data from 06/08/2017 through 07/07/2017 which is a total of 30 days, during which time he was 100 percent compliant with his CPAP, average usage of 7 hours and 11 minutes, AHI 0.5, leak acceptable for the 95th percentile at 10.8 L/m on a pressure of 9 cm with EPR of 2.    I saw him on 07/05/2015, at which time he reported doing well. He was fully compliant with CPAP therapy and his exam was stable. He had stopped taking Coumadin and was on Eliquis. He requested heated tubing for  his CPAP machine but otherwise left his CPAP. I suggested a one-year checkup.    I reviewed his CPAP compliance data from 06/03/2016 through 07/02/2016, which is a total of 30 days, during which time he used his machine every night with percent used days greater than 4 hours at 100%, indicating superb compliance with an average usage of 7 hours and 25 minutes, residual AHI low at 0.7 per hour, leak acceptable with the 95th percentile at 12.5 L/m on a pressure of 9 cm with EPR of 2.   I saw him on 01/02/2015, at which time he reported treatment compliance with CPAP. He changed from  [facility] home [facility] to [facility]. He retired in October 2015 and has been playing more golf. He uses his elliptical daily for about 30 minutes.   I reviewed his CPAP compliance data from 06/04/2015 through 07/03/2015 which is a total of 30 days during which time he used his machine every night with percent used days greater than 4 hours at 100%, indicating superb compliance with an average usage of 7 hours and 13 minutes, residual AHI low at 0.9 per hour, leak acceptable with the 95th percentile at 14.8 L/m on a pressure of 9 cm with EPR of 2.   I saw him on 06/29/2014, at which time he reported doing much better. He felt, he was sleeping better, his wife was pleased with how he was sleeping, his daytime somnolence and sleep consolidation were improved. He denied any restless leg symptoms and was not bothered by any leg movements or twitching at night. Overall he had done really well and was very pleased. He was completely compliant with treatment and had no issues adjusting to the machine and the mask. He was using nasal pillows and liked them.   I reviewed his CPAP compliance data from 12/02/2014 through 12/31/2014 which is a total of 30 days during which time he used his machine 28 days with percent used days greater than 4 hours of only 50%, indicating suboptimal compliance with an average usage of 3 hours and 59 minutes. 95th percentile of pressure was 10.2. Leak acceptable with the 95th percentile of leak at 12.9 L/m. Residual AHI at 3.8 per hour.    I first met him on 04/04/2014 at the request of his primary care physician, at which time the patient reported snoring, significant daytime somnolence and witnessed apneas per wife. I invited him back for sleep study. He had a split-night sleep study on 04/19/2014, and I went over his test results with him in detail today. Baseline sleep efficiency was significantly reduced at 53.9% with a long latency to sleep of 39.5 minutes and wake after sleep onset of 67.5  minutes with moderate sleep fragmentation noted. He had an elevated arousal index. He had an increased percentage of stage I and stage II sleep as well as absence of slow-wave and REM sleep. He had no significant periodic leg movements and EKG showed known A. fib. He had a paced cardiac rhythm. His total AHI was elevated at 25 per hour. Baseline oxygen saturation was 93%, nadir was 73%. He was then titrated on CPAP. Sleep efficiency was 67%. He had a absence of deep sleep but increased percentage of dream sleep at 47.1% after CPAP. His average oxygen saturation was 93% with a nadir of 89%. He was titrated from 5-9 cm with a reduction of his AHI to 0 events per hour at the final pressure with supine REM sleep achieved. He had severe periodic  leg movements during the treatment portion of the study but no significant arousals. Based on the test results I prescribed CPAP for him.   I reviewed the patient's CPAP compliance data from 05/16/2014 to 06/14/2014, which is a total of 30 days, during which time the patient used CPAP every day. The average usage for all days was 7 hours and 23 minutes. The percent used days greater than 4 hours was 100 %, indicating superb compliance. The residual AHI was 0.6 per hour, indicating an appropriate treatment pressure of 9 cwp with EPR of 2. Air leak from the mask was very low at 3 L per minute at the 95th percentile.  I reviewed his compliance data from 05/29/2014 through 06/27/2014 which is a total of 30 days during which time he used his machine every night with percent used days greater than 4 hours of 100%, again indicating flawless compliance. His average usage was 7 hours and 25 minutes. His residual AHI low at 0.3 with leak very low at 0.8 at the current pressure of 9 cm with EPR of 2.    His Past Medical History Is Significant For: Past Medical History:  Diagnosis Date  . Anal fissure   . Anal fistula   . Atrial fibrillation ([place])   . Blood transfusion without  reported diagnosis   . Cardiomyopathy    Nonishemic, possibly tachycardia-mediated. 1997 LHC with no significant CAD. Myoview (7/09) showed EF 44% with possible mild inferior ischemia. Echo (9/10): EF 60%  . Cataract   . CHF (congestive heart failure) ([place])   . Chronic atrial fibrillation (HCC)    s/p AV nodal ablation and on Coumadin. Patient developed dyspnea/fatigue with RV  pacing and an LV lead was placed  . Colon polyp    hyperplastic and adenomatous  . HTN (hypertension)   . Hyperlipidemia   . Hyperplastic colon polyp    and adenomatous  . Osteoarthritis of right shoulder region   . Pacemaker    St. Jude CRT-P device  . Pseudogout   . Sleep apnea   . Upper GI bleed    With duodenal ulcer in 2005.  Patient was on ASA and coumadin at that time. ASA was stopped.Marland Kitchen    His Past Surgical History Is Significant For: Past Surgical History:  Procedure Laterality Date  . [place] N/A 01/28/2017   Procedure: BiV Pacemaker Generator Changeout;  Surgeon: [name], MD;  Location: [facility];  Service: Cardiovascular;  Laterality: N/A;  . PACEMAKER INSERTION     St. Jude's  . SHOULDER SURGERY     Right  . TREATMENT FISTULA ANAL      His Family History Is Significant For: Family History  Problem Relation Age of Onset  . Heart disease Mother   . Stroke Father   . Diverticulitis Sister   . Colon cancer Neg Hx     His Social History Is Significant For: Social History   Socioeconomic History  . Marital status: Married    Spouse name: Di Kindle  . Number of children: 0  . Years of education: some coll.  . Highest education level: Not on file  Occupational History  . Occupation: Scientist, clinical (histocompatibility and immunogenetics): [employer]  Tobacco Use  . Smoking status: Never Smoker  . Smokeless tobacco: Never Used  Substance and Sexual Activity  . Alcohol use: No    Alcohol/week: 0.0 standard drinks    Comment: Very rare  . Drug use: No  .  Sexual  activity: Not on file  Other Topics Concern  . Not on file  Social History Narrative   Lives in [place].   Does a lot of traveling.   Exercises daily.   Daily Caffeine use.   Patient is right handed   Social Determinants of Health   Financial Resource Strain:   . Difficulty of Paying Living Expenses: Not on file  Food Insecurity:   . Worried About Charity fundraiser in the Last Year: Not on file  . Ran Out of Food in the Last Year: Not on file  Transportation Needs:   . Lack of Transportation (Medical): Not on file  . Lack of Transportation (Non-Medical): Not on file  Physical Activity:   . Days of Exercise per Week: Not on file  . Minutes of Exercise per Session: Not on file  Stress:   . Feeling of Stress : Not on file  Social Connections:   . Frequency of Communication with Friends and Family: Not on file  . Frequency of Social Gatherings with Friends and Family: Not on file  . Attends Religious Services: Not on file  . Active Member of Clubs or Organizations: Not on file  . Attends Archivist Meetings: Not on file  . Marital Status: Not on file    His Allergies Are:  No Known Allergies:   His Current Medications Are:  Outpatient Encounter Medications as of 11/01/2019  Medication Sig  . carvedilol (COREG) 25 MG tablet Take 25 mg by mouth 2 (two) times daily.    . colchicine 0.6 MG tablet Take 0.6 mg by mouth daily as needed (gout flare).   Marland Kitchen ELIQUIS 5 MG TABS tablet Take 5 mg by mouth 2 (two) times daily.  . furosemide (LASIX) 40 MG tablet Take 40 mg by mouth daily as needed (swelling).   . hydrochlorothiazide 25 MG tablet Take 25 mg by mouth daily.    . Multiple Vitamin (MULTIVITAMIN WITH MINERALS) TABS tablet Take 1 tablet by mouth daily.  Marland Kitchen omeprazole (PRILOSEC OTC) 20 MG tablet Take 20 mg by mouth daily.  . potassium chloride SA (K-DUR,KLOR-CON) 20 MEQ tablet Take 20 mEq by mouth daily as needed (When you take Lasix).   . ramipril (ALTACE) 10 MG  capsule Take 1 capsule (10 mg total) by mouth daily.  . rosuvastatin (CRESTOR) 40 MG tablet Take 20 mg by mouth daily with supper.   . traMADol (ULTRAM) 50 MG tablet Take 50 mg by mouth daily as needed for moderate pain.    No facility-administered encounter medications on file as of 11/01/2019.  :  Review of Systems:  Out of a complete 14 point review of systems, all are reviewed and negative with the exception of these symptoms as listed below: Review of Systems  Neurological:       Reports cpap is working well for him with no issues.     Objective:  Neurological Exam  Physical Exam Physical Examination:   Vitals:   11/01/19 1300  BP: 136/77  Pulse: 90  Temp: (!) 96.8 F (36 C)    General Examination: The patient is a very pleasant 74 y.o. male in no acute distress. He appears well-developed and well-nourished and well groomed.   HEENT:Normocephalic, atraumatic, pupils are equal, round and reactive to light, extraocular tracking is good without limitation to gaze excursion or nystagmus noted. Normal smooth pursuit is noted. Hearing is grossly intact. Face is symmetric with normal facial animation and normal facial sensation. Speech  is clear with no dysarthria noted. There is no hypophonia. There is no lip, neck/head, jaw or voice tremor. Neck with FROM. Oropharynx exam reveals: mild mouth dryness, adequate dental hygiene and moderate airway crowding. Tongue protrudes centrally and palate elevates symmetrically. Tonsils are absent.   Chest:Clear to auscultation without wheezing, rhonchi or crackles noted.  Heart:S1+S2+0, regular and normal without murmurs, rubs or gallops noted.   Abdomen:Soft, non-tender and non-distended with normal bowel sounds appreciated on auscultation.  Extremities:There is no pitting edema in the distal lower extremities bilaterally, wears Knee-high compression socks bilaterally.  Skin: Warm and dry without trophic changes noted, chronic  appearing changes distal legs.   Musculoskeletal: exam reveals Right shoulder pain and decreased range of motion in the right shoulder.    Neurologically:  Mental status: The patient is awake, alert and oriented in all 4 spheres. His immediate and remote memory, attention, language skills and fund of knowledge are appropriate. There is no evidence of aphasia, agnosia, apraxia or anomia. Speech is clear with normal prosody and enunciation. Thought process is linear. Mood is normal and affect is normal.  Cranial nerves II - XII are as described above under HEENT exam. Motor exam: Normal bulk, strength and tone is noted. There is no tremor. Fine motor skills and coordination:grosslyintact.  Cerebellar testing: No dysmetria or intention tremor on finger to nose testing. There is no truncal or gait ataxia.  Sensory exam: intact to light touch in the upper and lower extremities.  Gait, station and balance: He stands easily. No veering to one side is noted. No leaning to one side is noted. Posture is age-appropriate and stance is narrow based. Gait shows normal stride length and normal pace. No problems turning are noted.  Assessment and Plan:   In summary, [name] Page is a very pleasant 73 year old male with an underlying medical history of hyperlipidemia, arthritis, A. fib, status post pacemaker placement (and generator replacement in April 2018), obesity, allergic rhinitis, Right shoulder arthritis, Chronic venous insufficiency, and BPH, who presents for followup consultation of his obstructive sleep apnea, well established on CPAP of 9 cm.He continues to be fully compliant with treatment and reports ongoing good results. He is up-to-date with his supplies.  He had a split-night sleep study on 04/19/2014 which showed moderate to severe obstructive sleep apnea.  His exam is stable.  He is commended for his treatment adherence. I asked him to follow-up routinely in one year. I answered all his  questions today and he was in agreement. I have renewed his CPAP supply order and we will send it to his DME company.

## 2019-11-02 LAB — CUP PACEART REMOTE DEVICE CHECK
Battery Remaining Longevity: 91 mo
Battery Remaining Percentage: 95.5 %
Battery Voltage: 2.98 V
Date Time Interrogation Session: 20210120062215
Implantable Lead Implant Date: 20000217
Implantable Lead Implant Date: 20100414
Implantable Lead Location: 753858
Implantable Lead Location: 753860
Implantable Lead Model: 4196
Implantable Pulse Generator Implant Date: 20180418
Lead Channel Impedance Value: 410 Ohm
Lead Channel Impedance Value: 990 Ohm
Lead Channel Pacing Threshold Amplitude: 0.75 V
Lead Channel Pacing Threshold Amplitude: 0.875 V
Lead Channel Pacing Threshold Pulse Width: 0.5 ms
Lead Channel Pacing Threshold Pulse Width: 0.8 ms
Lead Channel Sensing Intrinsic Amplitude: 7.8 mV
Lead Channel Setting Pacing Amplitude: 2 V
Lead Channel Setting Pacing Amplitude: 2.5 V
Lead Channel Setting Pacing Pulse Width: 0.5 ms
Lead Channel Setting Pacing Pulse Width: 0.8 ms
Lead Channel Setting Sensing Sensitivity: 4 mV
Pulse Gen Model: 3222
Pulse Gen Serial Number: 8013108

## 2019-11-03 ENCOUNTER — Ambulatory Visit (INDEPENDENT_AMBULATORY_CARE_PROVIDER_SITE_OTHER): Payer: PPO | Admitting: *Deleted

## 2019-11-03 DIAGNOSIS — I5022 Chronic systolic (congestive) heart failure: Secondary | ICD-10-CM | POA: Diagnosis not present

## 2019-11-08 DIAGNOSIS — G4733 Obstructive sleep apnea (adult) (pediatric): Secondary | ICD-10-CM | POA: Diagnosis not present

## 2019-11-27 ENCOUNTER — Ambulatory Visit: Payer: PPO | Attending: Internal Medicine

## 2019-11-27 DIAGNOSIS — Z23 Encounter for immunization: Secondary | ICD-10-CM | POA: Insufficient documentation

## 2019-11-27 NOTE — Progress Notes (Signed)
   Covid-19 Vaccination Clinic  Name:  [name] Page    MRN: [id number] DOB: January 19, 1946  11/27/2019  Mr. [name] was observed post Covid-19 immunization for 15 minutes without incidence. He was provided with Vaccine Information Sheet and instruction to access the V-Safe system.   Mr. [name] was instructed to call 911 with any severe reactions post vaccine: Marland Kitchen Difficulty breathing  . Swelling of your face and throat  . A fast heartbeat  . A bad rash all over your body  . Dizziness and weakness    Immunizations Administered    Name Date Dose VIS Date Route   Pfizer COVID-19 Vaccine 11/27/2019 11:51 AM 0.3 mL 09/23/2019 Intramuscular   Manufacturer: [place]   Lot: [id number]   [place]: [id number]

## 2019-12-08 DIAGNOSIS — H35363 Drusen (degenerative) of macula, bilateral: Secondary | ICD-10-CM | POA: Diagnosis not present

## 2019-12-08 DIAGNOSIS — H2513 Age-related nuclear cataract, bilateral: Secondary | ICD-10-CM | POA: Diagnosis not present

## 2019-12-08 DIAGNOSIS — H25013 Cortical age-related cataract, bilateral: Secondary | ICD-10-CM | POA: Diagnosis not present

## 2019-12-08 DIAGNOSIS — H35033 Hypertensive retinopathy, bilateral: Secondary | ICD-10-CM | POA: Diagnosis not present

## 2019-12-08 DIAGNOSIS — H1132 Conjunctival hemorrhage, left eye: Secondary | ICD-10-CM | POA: Diagnosis not present

## 2019-12-20 ENCOUNTER — Ambulatory Visit: Payer: PPO | Attending: Internal Medicine

## 2019-12-20 DIAGNOSIS — Z23 Encounter for immunization: Secondary | ICD-10-CM

## 2019-12-20 NOTE — Progress Notes (Signed)
   Covid-19 Vaccination Clinic  Name:  CARSTON BEHRNS    MRN: [id number] DOB: Feb 06, 1946  12/20/2019  Mr. Bogle was observed post Covid-19 immunization for 15 minutes without incident. He was provided with Vaccine Information Sheet and instruction to access the V-Safe system.   Mr. Lala was instructed to call 911 with any severe reactions post vaccine: Marland Kitchen Difficulty breathing  . Swelling of face and throat  . A fast heartbeat  . A bad rash all over body  . Dizziness and weakness   Immunizations Administered    Name Date Dose VIS Date Route   Pfizer COVID-19 Vaccine 12/20/2019  3:45 PM 0.3 mL 09/23/2019 Intramuscular   Manufacturer: [place]   Lot: [id number]   [place]: [id number]

## 2020-01-02 DIAGNOSIS — I10 Essential (primary) hypertension: Secondary | ICD-10-CM | POA: Diagnosis not present

## 2020-01-02 DIAGNOSIS — Z7901 Long term (current) use of anticoagulants: Secondary | ICD-10-CM | POA: Diagnosis not present

## 2020-01-02 DIAGNOSIS — Z950 Presence of cardiac pacemaker: Secondary | ICD-10-CM | POA: Diagnosis not present

## 2020-01-02 DIAGNOSIS — Z1331 Encounter for screening for depression: Secondary | ICD-10-CM | POA: Diagnosis not present

## 2020-01-02 DIAGNOSIS — I4891 Unspecified atrial fibrillation: Secondary | ICD-10-CM | POA: Diagnosis not present

## 2020-01-02 DIAGNOSIS — N472 Paraphimosis: Secondary | ICD-10-CM | POA: Diagnosis not present

## 2020-01-27 DIAGNOSIS — K6289 Other specified diseases of anus and rectum: Secondary | ICD-10-CM | POA: Diagnosis not present

## 2020-01-27 DIAGNOSIS — R159 Full incontinence of feces: Secondary | ICD-10-CM | POA: Diagnosis not present

## 2020-01-30 ENCOUNTER — Encounter: Payer: Self-pay | Admitting: Internal Medicine

## 2020-02-02 ENCOUNTER — Ambulatory Visit (INDEPENDENT_AMBULATORY_CARE_PROVIDER_SITE_OTHER): Payer: PPO | Admitting: *Deleted

## 2020-02-02 DIAGNOSIS — I5022 Chronic systolic (congestive) heart failure: Secondary | ICD-10-CM

## 2020-02-02 LAB — CUP PACEART REMOTE DEVICE CHECK
Battery Remaining Longevity: 88 mo
Battery Remaining Percentage: 95.5 %
Battery Voltage: 2.98 V
Date Time Interrogation Session: 20210422015024
Implantable Lead Implant Date: 20000217
Implantable Lead Implant Date: 20100414
Implantable Lead Location: 753858
Implantable Lead Location: 753860
Implantable Lead Model: 4196
Implantable Pulse Generator Implant Date: 20180418
Lead Channel Impedance Value: 410 Ohm
Lead Channel Impedance Value: 930 Ohm
Lead Channel Pacing Threshold Amplitude: 0.75 V
Lead Channel Pacing Threshold Amplitude: 0.875 V
Lead Channel Pacing Threshold Pulse Width: 0.5 ms
Lead Channel Pacing Threshold Pulse Width: 0.8 ms
Lead Channel Sensing Intrinsic Amplitude: 12 mV
Lead Channel Setting Pacing Amplitude: 2 V
Lead Channel Setting Pacing Amplitude: 2.5 V
Lead Channel Setting Pacing Pulse Width: 0.5 ms
Lead Channel Setting Pacing Pulse Width: 0.8 ms
Lead Channel Setting Sensing Sensitivity: 4 mV
Pulse Gen Model: 3222
Pulse Gen Serial Number: 8013108

## 2020-02-03 NOTE — Progress Notes (Signed)
PPM Remote  

## 2020-02-07 DIAGNOSIS — G4733 Obstructive sleep apnea (adult) (pediatric): Secondary | ICD-10-CM | POA: Diagnosis not present

## 2020-02-23 ENCOUNTER — Telehealth: Payer: Self-pay

## 2020-02-23 ENCOUNTER — Encounter: Payer: Self-pay | Admitting: Internal Medicine

## 2020-02-23 ENCOUNTER — Ambulatory Visit: Payer: PPO | Admitting: Internal Medicine

## 2020-02-23 VITALS — BP 122/72 | HR 80 | Temp 97.4°F | Ht 73.0 in | Wt 254.6 lb

## 2020-02-23 DIAGNOSIS — R159 Full incontinence of feces: Secondary | ICD-10-CM

## 2020-02-23 DIAGNOSIS — Z86010 Personal history of colonic polyps: Secondary | ICD-10-CM

## 2020-02-23 DIAGNOSIS — Z7901 Long term (current) use of anticoagulants: Secondary | ICD-10-CM | POA: Diagnosis not present

## 2020-02-23 MED ORDER — NA SULFATE-K SULFATE-MG SULF 17.5-3.13-1.6 GM/177ML PO SOLN: 1.0000 | Freq: Once | ORAL | 0 refills | Status: AC

## 2020-02-23 NOTE — Progress Notes (Signed)
HISTORY OF PRESENT ILLNESS:  [name] Page is a very pleasant 74 y.o. male, retired Teaching laboratory technician, with multiple medical problems including a history of atrial fibrillation status post ablation and pacemaker placement on chronic anticoagulation therapy.  He also has a history of adenomatous colon polyps for which she has been seen in this office.  He presents today with chief complaint of perirectal drainage.  Patient tells me that about 20 years ago he was diagnosed with what sounds like a perirectal fistula or abscess that required surgical attention.  He had no problems until about 9 months ago when he began to notice some intermittent perirectal drainage.  Not feces.  Seemed darker purulent at times.  No rectal pain.  No change in bowel habits.  This spontaneously resolved about 6 weeks ago.  He was evaluated at his PCPs office at that time with unremarkable rectal exam.  Again has had no problems since.  It was advised that he present here to be evaluated.  He has had multiple colonoscopies for history of multiple enlarged hyperplastic polyps including sessile serrated polyps.  Prior examinations 2004, 2006, 2011, and 2016.  At the time of his last visit he was found to have a sessile serrated polyp for which follow-up in 5 years recommended.  He tells me that he has had no interval problems with his heart.  He did come off Coumadin for a short period of time for his last examination.  His cardiac care is under the supervision of Dr. Caryl Comes.  He has been fully vaccinated against Covid  REVIEW OF SYSTEMS:  All non-GI ROS negative unless otherwise stated in the HPI except for arthritis  Past Medical History:  Diagnosis Date  . Anal fissure   . Anal fistula   . Arthritis   . Atrial fibrillation ([place])   . Blood transfusion without reported diagnosis   . Cardiomyopathy    Nonishemic, possibly tachycardia-mediated. 1997 LHC with no significant CAD. Myoview (7/09) showed EF 44% with possible mild  inferior ischemia. Echo (9/10): EF 60%  . Cataract   . CHF (congestive heart failure) ([place])   . Chronic atrial fibrillation (HCC)    s/p AV nodal ablation and on Coumadin. Patient developed dyspnea/fatigue with RV  pacing and an LV lead was placed  . Colon polyp    hyperplastic and adenomatous  . HTN (hypertension)   . Hyperlipidemia   . Hyperplastic colon polyp    and adenomatous  . Osteoarthritis of right shoulder region   . Pacemaker    St. Jude CRT-P device  . Pseudogout   . Sleep apnea   . Upper GI bleed    With duodenal ulcer in 2005.  Patient was on ASA and coumadin at that time. ASA was stopped..    Past Surgical History:  Procedure Laterality Date  . [place] N/A 01/28/2017   Procedure: BiV Pacemaker Generator Changeout;  Surgeon: [name], MD;  Location: [facility];  Service: Cardiovascular;  Laterality: N/A;  . PACEMAKER INSERTION     St. Jude's  . SHOULDER SURGERY     Right  . TREATMENT FISTULA ANAL      Social History [name] Page  reports that he has never smoked. He has never used smokeless tobacco. He reports that he does not drink alcohol or use drugs.  family history includes Diabetes in his sister and sister; Diverticulitis in his sister; Heart disease in his mother; Stroke in his father.  No Known Allergies     PHYSICAL EXAMINATION: Vital signs: BP 122/72   Pulse 80   Temp (!) 97.4 F (36.3 C)   Ht 6\' 1"  (1.854 m)   Wt 254 lb 9.6 oz (115.5 kg)   BMI 33.59 kg/m   Constitutional: generally well-appearing, no acute distress Psychiatric: alert and oriented x3, cooperative Eyes: extraocular movements intact, anicteric, conjunctiva pink Mouth: Mask Neck: supple no lymphadenopathy Cardiovascular: heart regular rate and rhythm, no murmur Lungs: clear to auscultation bilaterally Abdomen: soft, nontender, nondistended, no obvious ascites, no peritoneal signs, normal bowel sounds, no organomegaly Rectal: No  perirectal abnormalities no tenderness or mass brown stool Extremities: no clubbing, cyanosis, or lower extremity edema bilaterally Skin: no lesions on visible extremities Neuro: No focal deficits.  Cranial nerves intact  ASSESSMENT:  1.  Based on history patient possibly had a draining fistula which has resolved.  No abnormalities on physical exam at this time.  No symptoms for 6 weeks.  Prior history of what sounds like a perirectal fistula or abscess requiring surgical attention in the early 1990s. 2.  History of multiple polyps on multiple colonoscopies.  Due for surveillance 3.  Multiple medical problems including a history of atrial fibrillation for which she is on chronic anticoagulation.  Currently in the form of Eliquis   PLAN:  1.  Fiber supplementation 2.  Schedule surveillance colonoscopy.  The patient is HIGH RISK given his comorbidities and the need to address his anticoagulation.The nature of the procedure, as well as the risks, benefits, and alternatives were carefully and thoroughly reviewed with the patient. Ample time for discussion and questions allowed. The patient understood, was satisfied, and agreed to proceed. 3.  Hold Eliquis 2 days prior to the procedure.  Anticipate resuming immediately post procedure unless otherwise indicated.  He has normal renal function.  We will confer with his cardiology team on this issue

## 2020-02-23 NOTE — Telephone Encounter (Signed)
Patient with diagnosis of afib on Eliquis for anticoagulation.    Procedure: Colonoscopy Date of procedure: 04/05/20  CHADS2-VASc score of  3 (CHF, HTN, AGE)  CrCl 110 ml/min  Per office protocol, patient can hold Eliquis for 2 days prior to procedure.

## 2020-02-23 NOTE — Telephone Encounter (Signed)
Pt has appt with Dr. Caryl Comes 03/09/20. I will send clearance to MD for upcoming appt for pre op clearance. I will remove from the pre op call back pool.

## 2020-02-23 NOTE — Patient Instructions (Signed)
If you are age 72 or older, your body mass index should be between 23-30. Your Body mass index is 33.59 kg/m. If this is out of the aforementioned range listed, please consider follow up with your Primary Care Provider.  If you are age 4 or younger, your body mass index should be between 19-25. Your Body mass index is 33.59 kg/m. If this is out of the aformentioned range listed, please consider follow up with your Primary Care Provider.    You have been scheduled for a colonoscopy. Please follow written instructions given to you at your visit today.  Please pick up your prep supplies at the pharmacy within the next 1-3 days. If you use inhalers (even only as needed), please bring them with you on the day of your procedure.

## 2020-02-23 NOTE — Telephone Encounter (Signed)
Primary Cardiologist:Steven Caryl Comes, MD  Chart reviewed as part of pre-operative protocol coverage. Because of [name] Page's past medical history and time since last visit, he/she will require a follow-up visit in order to better assess preoperative cardiovascular risk.  Pre-op covering staff: - Please schedule appointment and call patient to inform them. - Please contact requesting surgeon's office via preferred method (i.e, phone, fax) to inform them of need for appointment prior to surgery.  If applicable, this message will also be routed to pharmacy pool and/or primary cardiologist for input on holding anticoagulant/antiplatelet agent as requested below so that this information is available at time of patient's appointment.    [name] Page. [name] Sermons NP-C    02/23/2020, 2:09 PM [address] Office [phone] Fax [phone]

## 2020-02-23 NOTE — Telephone Encounter (Signed)
Lm on cell phone vm that patient could hold his Eliquis for 2 days prior to his procedure.  Requested a call back to let me know he got this message.

## 2020-02-23 NOTE — Telephone Encounter (Signed)
[facility] Pre-operative Risk Assessment     Request for surgical clearance:     Endoscopy Procedure  What type of surgery is being performed?     Colonoscopy  When is this surgery scheduled?     04/05/2020  What type of clearance is required ?   Pharmacy  Are there any medications that need to be held prior to surgery and how long? Xarelto - 2 days  Practice name and name of physician performing surgery?      [facility]  What is your office phone and fax number?      Phone- [phone]  [phone]  Anesthesia type (None, local, MAC, general) ?       MAC

## 2020-02-28 NOTE — Telephone Encounter (Signed)
Noted Thanks SK   

## 2020-03-09 ENCOUNTER — Other Ambulatory Visit: Payer: Self-pay

## 2020-03-09 ENCOUNTER — Ambulatory Visit (INDEPENDENT_AMBULATORY_CARE_PROVIDER_SITE_OTHER): Payer: PPO | Admitting: Internal Medicine

## 2020-03-09 ENCOUNTER — Encounter: Payer: Self-pay | Admitting: Internal Medicine

## 2020-03-09 VITALS — BP 134/70 | HR 78 | Ht 74.0 in | Wt 249.0 lb

## 2020-03-09 DIAGNOSIS — Z950 Presence of cardiac pacemaker: Secondary | ICD-10-CM

## 2020-03-09 DIAGNOSIS — I4821 Permanent atrial fibrillation: Secondary | ICD-10-CM | POA: Diagnosis not present

## 2020-03-09 DIAGNOSIS — I442 Atrioventricular block, complete: Secondary | ICD-10-CM

## 2020-03-09 LAB — CUP PACEART INCLINIC DEVICE CHECK
Battery Remaining Longevity: 82 mo
Battery Voltage: 2.98 V
Brady Statistic RA Percent Paced: 0 %
Brady Statistic RV Percent Paced: 99.82 %
Date Time Interrogation Session: 20210528153700
Implantable Lead Implant Date: 20000217
Implantable Lead Implant Date: 20100414
Implantable Lead Location: 753858
Implantable Lead Location: 753860
Implantable Lead Model: 4196
Implantable Pulse Generator Implant Date: 20180418
Lead Channel Impedance Value: 412.5 Ohm
Lead Channel Impedance Value: 937.5 Ohm
Lead Channel Pacing Threshold Amplitude: 0.75 V
Lead Channel Pacing Threshold Amplitude: 0.875 V
Lead Channel Pacing Threshold Pulse Width: 0.5 ms
Lead Channel Pacing Threshold Pulse Width: 0.8 ms
Lead Channel Setting Pacing Amplitude: 2 V
Lead Channel Setting Pacing Amplitude: 2.5 V
Lead Channel Setting Pacing Pulse Width: 0.5 ms
Lead Channel Setting Pacing Pulse Width: 0.8 ms
Lead Channel Setting Sensing Sensitivity: 4 mV
Pulse Gen Model: 3222
Pulse Gen Serial Number: 8013108

## 2020-03-09 NOTE — Patient Instructions (Signed)

## 2020-03-09 NOTE — Progress Notes (Signed)
Patient Care Team: [name], MD as PCP - General (Internal Medicine) [name], MD as PCP - Cardiology (Cardiology)   HPI  [name] Page is a 74 y.o. male Seen in followup for CRT-PM placement following AV ablation for permanent and rapid Afib years ago.  He underwent generator replacement 4/18    DATE TEST EF   7/18 Echo   55-60 %           Date Cr Hgb  4/18 0.8 14  2/21 0.77 11.1<   The patient denies chest pain, shortness of breath, nocturnal dyspnea, orthopnea, with asymmetric peripheral edema.  There have been no palpitations, lightheadedness or syncope.     ; Records and Results Reviewed   Past Medical History:  Diagnosis Date  . Anal fissure   . Anal fistula   . Arthritis   . Atrial fibrillation ([place])   . Blood transfusion without reported diagnosis   . Cardiomyopathy    Nonishemic, possibly tachycardia-mediated. 1997 LHC with no significant CAD. Myoview (7/09) showed EF 44% with possible mild inferior ischemia. Echo (9/10): EF 60%  . Cataract   . CHF (congestive heart failure) ([place])   . Chronic atrial fibrillation ([facility])    s/p AV nodal ablation and on Coumadin. Patient developed dyspnea/fatigue with RV  pacing and an LV lead was placed  . Colon polyp    hyperplastic and adenomatous  . HTN (hypertension)   . Hyperlipidemia   . Hyperplastic colon polyp    and adenomatous  . Osteoarthritis of right shoulder region   . Pacemaker    St. Jude CRT-P device  . Pseudogout   . Sleep apnea   . Upper GI bleed    With duodenal ulcer in 2005.  Patient was on ASA and coumadin at that time. ASA was stopped..    Past Surgical History:  Procedure Laterality Date  . [place] N/A 01/28/2017   Procedure: BiV Pacemaker Generator Changeout;  Surgeon: [name], MD;  Location: [facility];  Service: Cardiovascular;  Laterality: N/A;  . PACEMAKER INSERTION     St. Jude's  . SHOULDER SURGERY     Right  .  TREATMENT FISTULA ANAL      Current Outpatient Medications  Medication Sig Dispense Refill  . carvedilol (COREG) 25 MG tablet Take 25 mg by mouth 2 (two) times daily.      . colchicine 0.6 MG tablet Take 0.6 mg by mouth daily as needed (gout flare).     Marland Kitchen ELIQUIS 5 MG TABS tablet Take 5 mg by mouth 2 (two) times daily.    . furosemide (LASIX) 40 MG tablet Take 40 mg by mouth daily as needed (swelling).     . hydrochlorothiazide 25 MG tablet Take 25 mg by mouth daily.      . Multiple Vitamin (MULTIVITAMIN WITH MINERALS) TABS tablet Take 1 tablet by mouth daily.    Marland Kitchen omeprazole (PRILOSEC OTC) 20 MG tablet Take 20 mg by mouth daily.    . potassium chloride SA (K-DUR,KLOR-CON) 20 MEQ tablet Take 20 mEq by mouth daily as needed (When you take Lasix).     . rosuvastatin (CRESTOR) 40 MG tablet Take 20 mg by mouth daily with supper.     . traMADol (ULTRAM) 50 MG tablet Take 50 mg by mouth daily as needed for moderate pain.     . ramipril (ALTACE) 10 MG capsule Take 1 capsule (10 mg total) by  mouth daily.     No current facility-administered medications for this visit.    No Known Allergies    Review of Systems negative except from HPI and PMH  Physical Exam BP 134/70   Pulse 78   Ht 6\' 2"  (1.88 m)   Wt 249 lb (112.9 kg)   SpO2 96%   BMI 31.97 kg/m  Well developed and well nourished in no acute distress HENT normal Neck supple with JVP-flat Clear Device pocket well healed; without hematoma or erythema.  There is no tethering  Regular rate and rhythm, no murmur Abd-soft with active BS No Clubbing cyanosis   edema Skin-warm and dry A & Oriented  Grossly normal sensory and motor function  ECG  Afib  Complete heart block V pacing   Assessment and  Plan Atrial fibrillation-permanent  Complete heart block status post AV ablation  Pacemaker-CRT-St. Jude    On Anticoagulation;  No bleeding issues   BP well controlled  Euvolemic continue current meds         Current  medicines are reviewed at length with the patient today .  The patient does not have concerns regarding medicines.\

## 2020-03-13 NOTE — Addendum Note (Signed)
Addended by: Rose Phi on: 03/13/2020 12:55 PM   Modules accepted: Orders

## 2020-03-19 DIAGNOSIS — N478 Other disorders of prepuce: Secondary | ICD-10-CM | POA: Diagnosis not present

## 2020-03-23 ENCOUNTER — Encounter: Payer: Self-pay | Admitting: Internal Medicine

## 2020-04-05 ENCOUNTER — Ambulatory Visit (AMBULATORY_SURGERY_CENTER): Payer: PPO | Admitting: Internal Medicine

## 2020-04-05 ENCOUNTER — Encounter: Payer: Self-pay | Admitting: Internal Medicine

## 2020-04-05 ENCOUNTER — Other Ambulatory Visit: Payer: Self-pay

## 2020-04-05 VITALS — BP 150/89 | HR 80 | Temp 98.2°F | Resp 21 | Ht 73.0 in | Wt 254.0 lb

## 2020-04-05 DIAGNOSIS — E119 Type 2 diabetes mellitus without complications: Secondary | ICD-10-CM | POA: Diagnosis not present

## 2020-04-05 DIAGNOSIS — G4733 Obstructive sleep apnea (adult) (pediatric): Secondary | ICD-10-CM | POA: Diagnosis not present

## 2020-04-05 DIAGNOSIS — R159 Full incontinence of feces: Secondary | ICD-10-CM | POA: Diagnosis not present

## 2020-04-05 DIAGNOSIS — I10 Essential (primary) hypertension: Secondary | ICD-10-CM | POA: Diagnosis not present

## 2020-04-05 DIAGNOSIS — Z86010 Personal history of colonic polyps: Secondary | ICD-10-CM | POA: Diagnosis not present

## 2020-04-05 MED ORDER — SODIUM CHLORIDE 0.9 % IV SOLN: 500.0000 mL | INTRAVENOUS | Status: DC

## 2020-04-05 NOTE — Patient Instructions (Signed)
Normal Colonoscopy - No specimens taken   Resume Eliquis today at your usual dosage   Resume usual diet and medications    YOU HAD AN ENDOSCOPIC PROCEDURE TODAY AT [facility]:   Refer to the procedure report that was given to you for any specific questions about what was found during the examination.  If the procedure report does not answer your questions, please call your gastroenterologist to clarify.  If you requested that your care partner not be given the details of your procedure findings, then the procedure report has been included in a sealed envelope for you to review at your convenience later.  YOU SHOULD EXPECT: Some feelings of bloating in the abdomen. Passage of more gas than usual.  Walking can help get rid of the air that was put into your GI tract during the procedure and reduce the bloating. If you had a lower endoscopy (such as a colonoscopy or flexible sigmoidoscopy) you may notice spotting of blood in your stool or on the toilet paper. If you underwent a bowel prep for your procedure, you may not have a normal bowel movement for a few days.  Please Note:  You might notice some irritation and congestion in your nose or some drainage.  This is from the oxygen used during your procedure.  There is no need for concern and it should clear up in a day or so.  SYMPTOMS TO REPORT IMMEDIATELY:   Following lower endoscopy (colonoscopy or flexible sigmoidoscopy):  Excessive amounts of blood in the stool  Significant tenderness or worsening of abdominal pains  Swelling of the abdomen that is new, acute  Fever of 100F or higher    For urgent or emergent issues, a gastroenterologist can be reached at any hour by calling [phone]. Do not use MyChart messaging for urgent concerns.    DIET:  We do recommend a small meal at first, but then you may proceed to your regular diet.  Drink plenty of fluids but you should avoid alcoholic beverages for 24  hours.  ACTIVITY:  You should plan to take it easy for the rest of today and you should NOT DRIVE or use heavy machinery until tomorrow (because of the sedation medicines used during the test).    FOLLOW UP: Our staff will call the number listed on your records 48-72 hours following your procedure to check on you and address any questions or concerns that you may have regarding the information given to you following your procedure. If we do not reach you, we will leave a message.  We will attempt to reach you two times.  During this call, we will ask if you have developed any symptoms of COVID 19. If you develop any symptoms (ie: fever, flu-like symptoms, shortness of breath, cough etc.) before then, please call [phone].  If you test positive for Covid 19 in the 2 weeks post procedure, please call and report this information to [place].    If any biopsies were taken you will be contacted by phone or by letter within the next 1-3 weeks.  Please call us at [phone] if you have not heard about the biopsies in 3 weeks.    SIGNATURES/CONFIDENTIALITY: You and/or your care partner have signed paperwork which will be entered into your electronic medical record.  These signatures attest to the fact that that the information above on your After Visit Summary has been reviewed and is understood.  Full responsibility of the confidentiality of  this discharge information lies with you and/or your care-partner.

## 2020-04-05 NOTE — Op Note (Signed)
[facility] Patient Name: [name] Page Procedure Date: 04/05/2020 2:45 PM MRN: [id number] Endoscopist: Docia Chuck. Henrene Pastor , MD Age: 74 Referring MD:  Date of Birth: August 19, 1946 Gender: Male Account #: [id number] Procedure:                Colonoscopy Indications:              High risk colon cancer surveillance: Personal                            history of sessile serrated colon polyp (less than                            10 mm in size) with no dysplasia. Previous                            examinations 2004, 2006, 2011, 2016 Medicines:                Monitored Anesthesia Care Procedure:                Pre-Anesthesia Assessment:                           - Prior to the procedure, a History and Physical                            was performed, and patient medications and                            allergies were reviewed. The patient's tolerance of                            previous anesthesia was also reviewed. The risks                            and benefits of the procedure and the sedation                            options and risks were discussed with the patient.                            All questions were answered, and informed consent                            was obtained. Prior Anticoagulants: The patient has                            taken Eliquis (apixaban), last dose was 2 days                            prior to procedure. ASA Grade Assessment: III - A                            patient with severe systemic disease. After  reviewing the risks and benefits, the patient was                            deemed in satisfactory condition to undergo the                            procedure.                           After obtaining informed consent, the colonoscope                            was passed under direct vision. Throughout the                            procedure, the patient's blood pressure, pulse, and                             oxygen saturations were monitored continuously. The                            Colonoscope was introduced through the anus and                            advanced to the the cecum, identified by                            appendiceal orifice and ileocecal valve. The                            ileocecal valve, appendiceal orifice, and rectum                            were photographed. The quality of the bowel                            preparation was excellent. The colonoscopy was                            performed without difficulty. The patient tolerated                            the procedure well. The bowel preparation used was                            SUPREP via split dose instruction. Scope In: 3:03:40 PM Scope Out: 3:17:03 PM Scope Withdrawal Time: 0 hours 11 minutes 22 seconds  Total Procedure Duration: 0 hours 13 minutes 23 seconds  Findings:                 The entire examined colon appeared normal on direct                            and retroflexion views. Complications:  No immediate complications. Estimated blood loss:                            None. Estimated Blood Loss:     Estimated blood loss: none. Impression:               - The entire examined colon is normal on direct and                            retroflexion views.                           - No specimens collected. Recommendation:           - Repeat colonoscopy is not recommended for                            surveillance given the favorable findings on                            today's examination and the patient's current age.                           - Resume Eliquis (apixaban) today at prior dose.                           - Patient has a contact number available for                            emergencies. The signs and symptoms of potential                            delayed complications were discussed with the                            patient. Return to normal activities tomorrow.                             Written discharge instructions were provided to the                            patient.                           - Resume previous diet.                           - Continue present medications. Docia Chuck. Henrene Pastor, MD 04/05/2020 3:23:53 PM This report has been signed electronically.

## 2020-04-05 NOTE — Progress Notes (Signed)
Report to PACU, RN, vss, BBS= Clear.  

## 2020-04-05 NOTE — Progress Notes (Signed)
Vs VV 

## 2020-04-09 ENCOUNTER — Telehealth: Payer: Self-pay | Admitting: *Deleted

## 2020-04-09 ENCOUNTER — Telehealth: Payer: Self-pay

## 2020-04-09 NOTE — Telephone Encounter (Signed)
Follow up call made, left message. 

## 2020-04-09 NOTE — Telephone Encounter (Signed)
Covid-19 screening questions   Do you now or have you had a fever in the last 14 days? No.  Do you have any respiratory symptoms of shortness of breath or cough now or in the last 14 days? No.  Do you have any family members or close contacts with diagnosed or suspected Covid-19 in the past 14 days? No.  Have you been tested for Covid-19 and found to be positive? No.      Follow up Call-  Call back number 04/05/2020  Post procedure Call Back phone  # [phone]  Permission to leave phone message Yes  Some recent data might be hidden     Patient questions:  Do you have a fever, pain , or abdominal swelling? No. Pain Score  0 *  Have you tolerated food without any problems? Yes.    Have you been able to return to your normal activities? Yes.    Do you have any questions about your discharge instructions: Diet   No. Medications  No. Follow up visit  No.  Do you have questions or concerns about your Care? No.  Actions: * If pain score is 4 or above: No action needed, pain <4.

## 2020-05-02 LAB — CUP PACEART REMOTE DEVICE CHECK
Battery Remaining Longevity: 91 mo
Battery Remaining Percentage: 95.5 %
Battery Voltage: 2.98 V
Date Time Interrogation Session: 20210721040017
Implantable Lead Implant Date: 20000217
Implantable Lead Implant Date: 20100414
Implantable Lead Location: 753858
Implantable Lead Location: 753860
Implantable Lead Model: 4196
Implantable Pulse Generator Implant Date: 20180418
Lead Channel Impedance Value: 410 Ohm
Lead Channel Impedance Value: 960 Ohm
Lead Channel Pacing Threshold Amplitude: 0.75 V
Lead Channel Pacing Threshold Amplitude: 0.75 V
Lead Channel Pacing Threshold Pulse Width: 0.5 ms
Lead Channel Pacing Threshold Pulse Width: 0.8 ms
Lead Channel Sensing Intrinsic Amplitude: 12 mV
Lead Channel Setting Pacing Amplitude: 2 V
Lead Channel Setting Pacing Amplitude: 2.5 V
Lead Channel Setting Pacing Pulse Width: 0.5 ms
Lead Channel Setting Pacing Pulse Width: 0.8 ms
Lead Channel Setting Sensing Sensitivity: 4 mV
Pulse Gen Model: 3222
Pulse Gen Serial Number: 8013108

## 2020-05-03 ENCOUNTER — Ambulatory Visit (INDEPENDENT_AMBULATORY_CARE_PROVIDER_SITE_OTHER): Payer: PPO | Admitting: *Deleted

## 2020-05-03 DIAGNOSIS — I4821 Permanent atrial fibrillation: Secondary | ICD-10-CM | POA: Diagnosis not present

## 2020-05-04 NOTE — Progress Notes (Signed)
Remote pacemaker transmission.   

## 2020-05-08 DIAGNOSIS — G4733 Obstructive sleep apnea (adult) (pediatric): Secondary | ICD-10-CM | POA: Diagnosis not present

## 2020-07-05 DIAGNOSIS — M109 Gout, unspecified: Secondary | ICD-10-CM | POA: Diagnosis not present

## 2020-07-05 DIAGNOSIS — I10 Essential (primary) hypertension: Secondary | ICD-10-CM | POA: Diagnosis not present

## 2020-07-05 DIAGNOSIS — Z125 Encounter for screening for malignant neoplasm of prostate: Secondary | ICD-10-CM | POA: Diagnosis not present

## 2020-07-05 DIAGNOSIS — E785 Hyperlipidemia, unspecified: Secondary | ICD-10-CM | POA: Diagnosis not present

## 2020-07-12 DIAGNOSIS — E669 Obesity, unspecified: Secondary | ICD-10-CM | POA: Diagnosis not present

## 2020-07-12 DIAGNOSIS — N401 Enlarged prostate with lower urinary tract symptoms: Secondary | ICD-10-CM | POA: Diagnosis not present

## 2020-07-12 DIAGNOSIS — Z23 Encounter for immunization: Secondary | ICD-10-CM | POA: Diagnosis not present

## 2020-07-12 DIAGNOSIS — M1990 Unspecified osteoarthritis, unspecified site: Secondary | ICD-10-CM | POA: Diagnosis not present

## 2020-07-12 DIAGNOSIS — R82998 Other abnormal findings in urine: Secondary | ICD-10-CM | POA: Diagnosis not present

## 2020-07-12 DIAGNOSIS — I10 Essential (primary) hypertension: Secondary | ICD-10-CM | POA: Diagnosis not present

## 2020-07-12 DIAGNOSIS — I872 Venous insufficiency (chronic) (peripheral): Secondary | ICD-10-CM | POA: Diagnosis not present

## 2020-07-12 DIAGNOSIS — Z0000 Encounter for general adult medical examination without abnormal findings: Secondary | ICD-10-CM | POA: Diagnosis not present

## 2020-08-02 ENCOUNTER — Ambulatory Visit (INDEPENDENT_AMBULATORY_CARE_PROVIDER_SITE_OTHER): Payer: PPO

## 2020-08-02 DIAGNOSIS — I4821 Permanent atrial fibrillation: Secondary | ICD-10-CM

## 2020-08-02 LAB — CUP PACEART REMOTE DEVICE CHECK
Battery Remaining Longevity: 86 mo
Battery Remaining Percentage: 95.5 %
Battery Voltage: 2.96 V
Date Time Interrogation Session: 20211021112923
Implantable Lead Implant Date: 20000217
Implantable Lead Implant Date: 20100414
Implantable Lead Location: 753858
Implantable Lead Location: 753860
Implantable Lead Model: 4196
Implantable Pulse Generator Implant Date: 20180418
Lead Channel Impedance Value: 430 Ohm
Lead Channel Impedance Value: 960 Ohm
Lead Channel Pacing Threshold Amplitude: 0.75 V
Lead Channel Pacing Threshold Amplitude: 0.75 V
Lead Channel Pacing Threshold Pulse Width: 0.5 ms
Lead Channel Pacing Threshold Pulse Width: 0.8 ms
Lead Channel Sensing Intrinsic Amplitude: 12 mV
Lead Channel Setting Pacing Amplitude: 2 V
Lead Channel Setting Pacing Amplitude: 2.5 V
Lead Channel Setting Pacing Pulse Width: 0.5 ms
Lead Channel Setting Pacing Pulse Width: 0.8 ms
Lead Channel Setting Sensing Sensitivity: 4 mV
Pulse Gen Model: 3222
Pulse Gen Serial Number: 8013108

## 2020-08-08 NOTE — Progress Notes (Signed)
Remote pacemaker transmission.   

## 2020-08-09 DIAGNOSIS — G4733 Obstructive sleep apnea (adult) (pediatric): Secondary | ICD-10-CM | POA: Diagnosis not present

## 2020-08-10 DIAGNOSIS — M9903 Segmental and somatic dysfunction of lumbar region: Secondary | ICD-10-CM | POA: Diagnosis not present

## 2020-08-13 DIAGNOSIS — M9903 Segmental and somatic dysfunction of lumbar region: Secondary | ICD-10-CM | POA: Diagnosis not present

## 2020-08-20 DIAGNOSIS — M9903 Segmental and somatic dysfunction of lumbar region: Secondary | ICD-10-CM | POA: Diagnosis not present

## 2020-11-01 ENCOUNTER — Ambulatory Visit (INDEPENDENT_AMBULATORY_CARE_PROVIDER_SITE_OTHER): Payer: PPO

## 2020-11-01 ENCOUNTER — Ambulatory Visit: Payer: PPO | Admitting: Neurology

## 2020-11-01 ENCOUNTER — Encounter: Payer: Self-pay | Admitting: Neurology

## 2020-11-01 ENCOUNTER — Other Ambulatory Visit: Payer: Self-pay

## 2020-11-01 VITALS — BP 132/80 | HR 98 | Ht 74.0 in | Wt 253.0 lb

## 2020-11-01 DIAGNOSIS — I442 Atrioventricular block, complete: Secondary | ICD-10-CM

## 2020-11-01 DIAGNOSIS — G4733 Obstructive sleep apnea (adult) (pediatric): Secondary | ICD-10-CM

## 2020-11-01 DIAGNOSIS — Z9989 Dependence on other enabling machines and devices: Secondary | ICD-10-CM | POA: Diagnosis not present

## 2020-11-01 LAB — CUP PACEART REMOTE DEVICE CHECK
Battery Remaining Longevity: 91 mo
Battery Remaining Percentage: 95.5 %
Battery Voltage: 2.98 V
Date Time Interrogation Session: 20220120020014
Implantable Lead Implant Date: 20000217
Implantable Lead Implant Date: 20100414
Implantable Lead Location: 753858
Implantable Lead Location: 753860
Implantable Lead Model: 4196
Implantable Pulse Generator Implant Date: 20180418
Lead Channel Impedance Value: 410 Ohm
Lead Channel Impedance Value: 960 Ohm
Lead Channel Pacing Threshold Amplitude: 0.75 V
Lead Channel Pacing Threshold Amplitude: 0.875 V
Lead Channel Pacing Threshold Pulse Width: 0.5 ms
Lead Channel Pacing Threshold Pulse Width: 0.8 ms
Lead Channel Sensing Intrinsic Amplitude: 10.3 mV
Lead Channel Setting Pacing Amplitude: 2 V
Lead Channel Setting Pacing Amplitude: 2.5 V
Lead Channel Setting Pacing Pulse Width: 0.5 ms
Lead Channel Setting Pacing Pulse Width: 0.8 ms
Lead Channel Setting Sensing Sensitivity: 4 mV
Pulse Gen Model: 3222
Pulse Gen Serial Number: 8013108

## 2020-11-01 NOTE — Patient Instructions (Signed)
It was nice to see you again today.  Please continue your CPAP regularly, you are fully compliant with it, keep up the good work!   I have renewed your prescription for supplies, please follow-up routinely to see one of our nurse practitioners next year.

## 2020-11-01 NOTE — Progress Notes (Signed)
Subjective:    Patient ID: [name] Page is a 75 y.o. male.  HPI     Interim history:   [name] Page is a very pleasant 75 year old right-handed gentleman with an underlying medical history of hyperlipidemia, arthritis, A. fib, s/p ablation in 2000, status post pacemaker placement, obesity, allergic rhinitis, BPH, who presents for followup consultation of his obstructive sleep apnea, well established on treatment with CPAP therapy. The patient is unaccompanied today and presents for his yearly checkup.  I last saw him on 11/01/2019, at which time he was compliant with CPAP and doing well.   Today, 11/01/20: I reviewed his CPAP compliance data from 10/01/2020 through 10/30/2020, which is a total of 30 days, during which time he used his machine every night with percent use days greater than 4 hours at 100%, indicating superb compliance, average usage of 7 hours and 29 minutes, residual AHI at goal at 0.4/h, leak acceptable with a 95th percentile at 9.6 L/min on a pressure of 9 cm with EPR of 2.  He reports doing well.  He is up-to-date with his supplies, uses nasal pillows, gets his supplies quarterly through [facility].  He has no new complaints.  He sees his electrophysiologist in cardiology yearly and his primary care twice yearly.     The patient's allergies, current medications, family history, past medical history, past social history, past surgical history and problem list were reviewed and updated as appropriate.    Previously (copied from previous notes for reference):    I reviewed his CPAP compliance data from 10/01/2019 through 10/30/2019, which is a total of 30 days, during which time he used his machine every night with percent use days greater than 4 hours at 100%, indicating superb compliance with an average usage of 7 hours and 33 minutes, residual AHI at goal at 0.3/h, leak low with a 95th percentile at 5 L/min on a pressure of 9 cm with EPR of 2.  I saw him on 07/15/2018, at which time he  was fully compliant with his CPAP and was doing well.  He opted to delay his follow-up appointment from October 2020.       I saw him on 07/09/2017, at which time he was fully compliant with CPAP therapy and was doing well, he had an interim pacemaker generator change in April 2018 without problems. He was advised to follow-up routinely in one year.   I reviewed his CPAP compliance data from 06/14/2018 through 07/13/2018 which is a total of 30 days, during which time he used his CPAP every night with percent used days greater than 4 hours at 100%, indicating superb compliance with an average usage of 7 hours and 28 minutes, residual AHI at goal at 0.8 per hour, leak on the low side with the 95th percentile at 5.2 L/m, pressure of 9 cm with EPR of 2.    I saw him on 07/07/2016, at which time he was fully compliant with his CPAP, doing well. He was physically active, he had no telltale restless leg symptoms. I suggested a one-year checkup.   I reviewed his CPAP compliance data from 06/08/2017 through 07/07/2017 which is a total of 30 days, during which time he was 100 percent compliant with his CPAP, average usage of 7 hours and 11 minutes, AHI 0.5, leak acceptable for the 95th percentile at 10.8 L/m on a pressure of 9 cm with EPR of 2.    I saw him on 07/05/2015, at which time he reported doing  well. He was fully compliant with CPAP therapy and his exam was stable. He had stopped taking Coumadin and was on Eliquis. He requested heated tubing for his CPAP machine but otherwise left his CPAP. I suggested a one-year checkup.    I reviewed his CPAP compliance data from 06/03/2016 through 07/02/2016, which is a total of 30 days, during which time he used his machine every night with percent used days greater than 4 hours at 100%, indicating superb compliance with an average usage of 7 hours and 25 minutes, residual AHI low at 0.7 per hour, leak acceptable with the 95th percentile at 12.5 L/m on a pressure of  9 cm with EPR of 2.   I saw him on 01/02/2015, at which time he reported treatment compliance with CPAP. He changed from advanced home care to [facility]. He retired in October 2015 and has been playing more golf. He uses his elliptical daily for about 30 minutes.   I reviewed his CPAP compliance data from 06/04/2015 through 07/03/2015 which is a total of 30 days during which time he used his machine every night with percent used days greater than 4 hours at 100%, indicating superb compliance with an average usage of 7 hours and 13 minutes, residual AHI low at 0.9 per hour, leak acceptable with the 95th percentile at 14.8 L/m on a pressure of 9 cm with EPR of 2.   I saw him on 06/29/2014, at which time he reported doing much better. He felt, he was sleeping better, his wife was pleased with how he was sleeping, his daytime somnolence and sleep consolidation were improved. He denied any restless leg symptoms and was not bothered by any leg movements or twitching at night. Overall he had done really well and was very pleased. He was completely compliant with treatment and had no issues adjusting to the machine and the mask. He was using nasal pillows and liked them.   I reviewed his CPAP compliance data from 12/02/2014 through 12/31/2014 which is a total of 30 days during which time he used his machine 28 days with percent used days greater than 4 hours of only 50%, indicating suboptimal compliance with an average usage of 3 hours and 59 minutes. 95th percentile of pressure was 10.2. Leak acceptable with the 95th percentile of leak at 12.9 L/m. Residual AHI at 3.8 per hour.    I first met him on 04/04/2014 at the request of his primary care physician, at which time the patient reported snoring, significant daytime somnolence and witnessed apneas per wife. I invited him back for sleep study. He had a split-night sleep study on 04/19/2014, and I went over his test results with him in detail today. Baseline sleep  efficiency was significantly reduced at 53.9% with a long latency to sleep of 39.5 minutes and wake after sleep onset of 67.5 minutes with moderate sleep fragmentation noted. He had an elevated arousal index. He had an increased percentage of stage I and stage II sleep as well as absence of slow-wave and REM sleep. He had no significant periodic leg movements and EKG showed known A. fib. He had a paced cardiac rhythm. His total AHI was elevated at 25 per hour. Baseline oxygen saturation was 93%, nadir was 73%. He was then titrated on CPAP. Sleep efficiency was 67%. He had a absence of deep sleep but increased percentage of dream sleep at 47.1% after CPAP. His average oxygen saturation was 93% with a nadir of 89%. He was titrated  from 5-9 cm with a reduction of his AHI to 0 events per hour at the final pressure with supine REM sleep achieved. He had severe periodic leg movements during the treatment portion of the study but no significant arousals. Based on the test results I prescribed CPAP for him.   I reviewed the patient's CPAP compliance data from 05/16/2014 to 06/14/2014, which is a total of 30 days, during which time the patient used CPAP every day. The average usage for all days was 7 hours and 23 minutes. The percent used days greater than 4 hours was 100 %, indicating superb compliance. The residual AHI was 0.6 per hour, indicating an appropriate treatment pressure of 9 cwp with EPR of 2. Air leak from the mask was very low at 3 L per minute at the 95th percentile.  I reviewed his compliance data from 05/29/2014 through 06/27/2014 which is a total of 30 days during which time he used his machine every night with percent used days greater than 4 hours of 100%, again indicating flawless compliance. His average usage was 7 hours and 25 minutes. His residual AHI low at 0.3 with leak very low at 0.8 at the current pressure of 9 cm with EPR of 2.   His Past Medical History Is Significant For: Past Medical  History:  Diagnosis Date  . Anal fissure   . Anal fistula   . Arthritis   . Atrial fibrillation ([place])   . Blood transfusion without reported diagnosis   . Cardiomyopathy    Nonishemic, possibly tachycardia-mediated. 1997 LHC with no significant CAD. Myoview (7/09) showed EF 44% with possible mild inferior ischemia. Echo (9/10): EF 60%  . Cataract   . CHF (congestive heart failure) ([place])   . Chronic atrial fibrillation (HCC)    s/p AV nodal ablation and on Coumadin. Patient developed dyspnea/fatigue with RV  pacing and an LV lead was placed  . Colon polyp    hyperplastic and adenomatous  . HTN (hypertension)   . Hyperlipidemia   . Hyperplastic colon polyp    and adenomatous  . Osteoarthritis of right shoulder region   . Pacemaker    St. Jude CRT-P device  . Pseudogout   . Sleep apnea   . Upper GI bleed    With duodenal ulcer in 2005.  Patient was on ASA and coumadin at that time. ASA was stopped.Marland Kitchen    His Past Surgical History Is Significant For: Past Surgical History:  Procedure Laterality Date  . [place] N/A 01/28/2017   Procedure: BiV Pacemaker Generator Changeout;  Surgeon: [name], MD;  Location: [facility];  Service: Cardiovascular;  Laterality: N/A;  . PACEMAKER INSERTION     St. Jude's  . SHOULDER SURGERY     Right  . TREATMENT FISTULA ANAL      His Family History Is Significant For: Family History  Problem Relation Age of Onset  . Heart disease Mother   . Stroke Father   . Diverticulitis Sister   . Diabetes Sister   . Diabetes Sister   . Colon cancer Neg Hx     His Social History Is Significant For: Social History   Socioeconomic History  . Marital status: Married    Spouse name: Di Kindle  . Number of children: 0  . Years of education: some coll.  . Highest education level: Not on file  Occupational History  . Occupation: Scientist, clinical (histocompatibility and immunogenetics): [employer]  Tobacco Use  .  Smoking status: Never Smoker   . Smokeless tobacco: Never Used  Vaping Use  . Vaping Use: Never used  Substance and Sexual Activity  . Alcohol use: Yes    Alcohol/week: 0.0 standard drinks    Comment: Very rare  . Drug use: No  . Sexual activity: Not on file  Other Topics Concern  . Not on file  Social History Narrative   Lives in [place].   Does a lot of traveling.   Exercises daily.   Daily Caffeine use.   Patient is right handed   Social Determinants of Health   Financial Resource Strain: Not on file  Food Insecurity: Not on file  Transportation Needs: Not on file  Physical Activity: Not on file  Stress: Not on file  Social Connections: Not on file    His Allergies Are:  No Known Allergies:   His Current Medications Are:  Outpatient Encounter Medications as of 11/01/2020  Medication Sig  . carvedilol (COREG) 25 MG tablet Take 25 mg by mouth 2 (two) times daily.  . colchicine 0.6 MG tablet Take 0.6 mg by mouth daily as needed (gout flare).   Marland Kitchen ELIQUIS 5 MG TABS tablet Take 5 mg by mouth 2 (two) times daily.  . furosemide (LASIX) 40 MG tablet Take 40 mg by mouth daily as needed (swelling).  . hydrochlorothiazide 25 MG tablet Take 25 mg by mouth daily.  . Multiple Vitamin (MULTIVITAMIN WITH MINERALS) TABS tablet Take 1 tablet by mouth daily.  Marland Kitchen omeprazole (PRILOSEC OTC) 20 MG tablet Take 20 mg by mouth daily.  . potassium chloride SA (K-DUR,KLOR-CON) 20 MEQ tablet Take 20 mEq by mouth daily as needed (When you take Lasix).  . rosuvastatin (CRESTOR) 40 MG tablet Take 20 mg by mouth daily with supper.  . traMADol (ULTRAM) 50 MG tablet Take 50 mg by mouth daily as needed for moderate pain.  . ramipril (ALTACE) 10 MG capsule Take 1 capsule (10 mg total) by mouth daily.   No facility-administered encounter medications on file as of 11/01/2020.  :  Review of Systems:  Out of a complete 14 point review of systems, all are reviewed and negative with the exception of these symptoms as listed  below: Review of Systems  Neurological:       Here for yearly f/u. Reports he has been doing well. No complaints.     Objective:  Neurological Exam  Physical Exam Physical Examination:   Vitals:   11/01/20 1253  BP: 132/80  Pulse: 98  SpO2: 98%    General Examination: The patient is a very pleasant 75 y.o. male in no acute distress. He appears well-developed and well-nourished and well groomed.   HEENT:Normocephalic, atraumatic, pupils are equal, round and reactive to light, extraocular tracking is well-preserved, hearing is grossly intact.  Speech is clear without dysarthria, hypophonia or voice tremor, airway examination reveals no significant mouth dryness, stable findings, moderate airway crowding.  Tongue protrudes centrally and palate elevates symmetrically.    Chest:Clear to auscultation without wheezing, rhonchi or crackles noted.  Heart:S1+S2+0, regular and normal without murmurs, rubs or gallops noted.   Abdomen:Soft, non-tender and non-distended.  Extremities:There is  trace edema around the right ankle, he wears knee-high compression socks.    Skin: Warm and dry without trophic changes noted, chronic appearing changes distal legs.   Musculoskeletal: exam reveals  no obvious joint tenderness.  Neurologically:  Mental status: The patient is awake, alert and oriented in all 4 spheres. His immediate and remote  memory, attention, language skills and fund of knowledge are appropriate. There is no evidence of aphasia, agnosia, apraxia or anomia. Speech is clear with normal prosody and enunciation. Thought process is linear. Mood is normal and affect is normal.  Cranial nerves II - XII are as described above under HEENT exam. Motor exam: Normal bulk, strength and tone is noted. There is no tremor. Fine motor skills and coordination:grosslyintact.  Cerebellar testing: No dysmetria or intention tremor. There is no truncal or gait ataxia.  Sensory exam: intact to  light touch.   Gait, station and balance: He stands easily. No veering to one side is noted. No leaning to one side is noted. Posture is age-appropriate and stance is narrow based. Gait shows normal stride length and normal pace. No problems turning are noted.  Assessment and Plan:   In summary, [name] Page is a very pleasant57 year old male with an underlying medical history of hyperlipidemia, arthritis, A. fib, status post pacemaker placement(and generator replacement in April 2018), obesity, allergic rhinitis,arthritis, chronic venous insufficiency, andBPH, who presents for followup consultation of his obstructive sleep apnea, well established on CPAP of 9 cm.He continues to befully compliant with treatment and reports ongoing good results. He is up-to-date with his supplies.  Of note, he had a split-night sleep study on 04/19/2014 which showed moderate to severe obstructive sleep apnea.  His exam is stable.  He is commended for his treatment adherence.  He should be eligible for a new machine but we mutually agreed to delay a replacement of his machine until next year especially since he is doing well with his current machine without any problem and given the national shortage of getting new CPAP machines he is agreeable to continuing with his current equipment.  He is getting new supplies on a quarterly basis from his DME company.  I renewed his prescription for supplies and asked him to follow-up routinely in 1 year to see one of our nurse practitioners.  I answered all his questions today and he was in agreement.

## 2020-11-09 DIAGNOSIS — G4733 Obstructive sleep apnea (adult) (pediatric): Secondary | ICD-10-CM | POA: Diagnosis not present

## 2020-11-14 NOTE — Progress Notes (Signed)
Remote pacemaker transmission.   

## 2020-12-10 DIAGNOSIS — H35363 Drusen (degenerative) of macula, bilateral: Secondary | ICD-10-CM | POA: Diagnosis not present

## 2020-12-10 DIAGNOSIS — H524 Presbyopia: Secondary | ICD-10-CM | POA: Diagnosis not present

## 2020-12-10 DIAGNOSIS — H25013 Cortical age-related cataract, bilateral: Secondary | ICD-10-CM | POA: Diagnosis not present

## 2020-12-10 DIAGNOSIS — H04123 Dry eye syndrome of bilateral lacrimal glands: Secondary | ICD-10-CM | POA: Diagnosis not present

## 2020-12-10 DIAGNOSIS — H2513 Age-related nuclear cataract, bilateral: Secondary | ICD-10-CM | POA: Diagnosis not present

## 2021-01-07 DIAGNOSIS — G4730 Sleep apnea, unspecified: Secondary | ICD-10-CM | POA: Diagnosis not present

## 2021-01-07 DIAGNOSIS — Z1331 Encounter for screening for depression: Secondary | ICD-10-CM | POA: Diagnosis not present

## 2021-01-07 DIAGNOSIS — M1990 Unspecified osteoarthritis, unspecified site: Secondary | ICD-10-CM | POA: Diagnosis not present

## 2021-01-07 DIAGNOSIS — N401 Enlarged prostate with lower urinary tract symptoms: Secondary | ICD-10-CM | POA: Diagnosis not present

## 2021-01-07 DIAGNOSIS — E785 Hyperlipidemia, unspecified: Secondary | ICD-10-CM | POA: Diagnosis not present

## 2021-01-07 DIAGNOSIS — M109 Gout, unspecified: Secondary | ICD-10-CM | POA: Diagnosis not present

## 2021-01-07 DIAGNOSIS — I4891 Unspecified atrial fibrillation: Secondary | ICD-10-CM | POA: Diagnosis not present

## 2021-01-07 DIAGNOSIS — Z950 Presence of cardiac pacemaker: Secondary | ICD-10-CM | POA: Diagnosis not present

## 2021-01-07 DIAGNOSIS — Z1339 Encounter for screening examination for other mental health and behavioral disorders: Secondary | ICD-10-CM | POA: Diagnosis not present

## 2021-01-07 DIAGNOSIS — I872 Venous insufficiency (chronic) (peripheral): Secondary | ICD-10-CM | POA: Diagnosis not present

## 2021-01-07 DIAGNOSIS — I10 Essential (primary) hypertension: Secondary | ICD-10-CM | POA: Diagnosis not present

## 2021-01-31 ENCOUNTER — Ambulatory Visit (INDEPENDENT_AMBULATORY_CARE_PROVIDER_SITE_OTHER): Payer: PPO

## 2021-01-31 DIAGNOSIS — I442 Atrioventricular block, complete: Secondary | ICD-10-CM

## 2021-01-31 LAB — CUP PACEART REMOTE DEVICE CHECK
Battery Remaining Longevity: 85 mo
Battery Remaining Percentage: 95.5 %
Battery Voltage: 2.96 V
Date Time Interrogation Session: 20220421020019
Implantable Lead Implant Date: 20000217
Implantable Lead Implant Date: 20100414
Implantable Lead Location: 753858
Implantable Lead Location: 753860
Implantable Lead Model: 4196
Implantable Pulse Generator Implant Date: 20180418
Lead Channel Impedance Value: 390 Ohm
Lead Channel Impedance Value: 930 Ohm
Lead Channel Pacing Threshold Amplitude: 0.75 V
Lead Channel Pacing Threshold Amplitude: 0.875 V
Lead Channel Pacing Threshold Pulse Width: 0.5 ms
Lead Channel Pacing Threshold Pulse Width: 0.8 ms
Lead Channel Sensing Intrinsic Amplitude: 10.3 mV
Lead Channel Setting Pacing Amplitude: 2 V
Lead Channel Setting Pacing Amplitude: 2.5 V
Lead Channel Setting Pacing Pulse Width: 0.5 ms
Lead Channel Setting Pacing Pulse Width: 0.8 ms
Lead Channel Setting Sensing Sensitivity: 4 mV
Pulse Gen Model: 3222
Pulse Gen Serial Number: 8013108

## 2021-02-07 DIAGNOSIS — G4733 Obstructive sleep apnea (adult) (pediatric): Secondary | ICD-10-CM | POA: Diagnosis not present

## 2021-02-18 NOTE — Progress Notes (Signed)
Remote pacemaker transmission.   

## 2021-03-28 ENCOUNTER — Ambulatory Visit: Payer: PPO | Admitting: Internal Medicine

## 2021-03-28 ENCOUNTER — Other Ambulatory Visit: Payer: Self-pay

## 2021-03-28 ENCOUNTER — Encounter: Payer: Self-pay | Admitting: Internal Medicine

## 2021-03-28 VITALS — BP 112/80 | HR 76 | Ht 74.0 in | Wt 251.0 lb

## 2021-03-28 DIAGNOSIS — I4821 Permanent atrial fibrillation: Secondary | ICD-10-CM

## 2021-03-28 DIAGNOSIS — I5022 Chronic systolic (congestive) heart failure: Secondary | ICD-10-CM

## 2021-03-28 DIAGNOSIS — I442 Atrioventricular block, complete: Secondary | ICD-10-CM

## 2021-03-28 DIAGNOSIS — Z950 Presence of cardiac pacemaker: Secondary | ICD-10-CM

## 2021-03-28 DIAGNOSIS — Z79899 Other long term (current) drug therapy: Secondary | ICD-10-CM

## 2021-03-28 NOTE — Progress Notes (Signed)
Patient Care Team: [name], MD as PCP - General (Internal Medicine) [name], MD as PCP - Cardiology (Cardiology)   HPI  [name] Page is a 75 y.o. male Seen in followup for CRT-PM placement following AV ablation for permanent and rapid Afib years ago.  He The patient denies chest pain, shortness of breath, nocturnal dyspnea, orthopnea or peripheral edema.  There have been no palpitations, lightheadedness or syncope.      DATE TEST EF   7/18 Echo   55-60 %           Date Cr Hgb  4/18 0.8 14  2/21 0.77 11.1<        ; Records and Results Reviewed   Past Medical History:  Diagnosis Date   Anal fissure    Anal fistula    Arthritis    Atrial fibrillation ([place])    Blood transfusion without reported diagnosis    Cardiomyopathy    Nonishemic, possibly tachycardia-mediated. 1997 LHC with no significant CAD. Myoview (7/09) showed EF 44% with possible mild inferior ischemia. Echo (9/10): EF 60%   Cataract    CHF (congestive heart failure) (HCC)    Chronic atrial fibrillation (HCC)    s/p AV nodal ablation and on Coumadin. Patient developed dyspnea/fatigue with RV  pacing and an LV lead was placed   Colon polyp    hyperplastic and adenomatous   HTN (hypertension)    Hyperlipidemia    Hyperplastic colon polyp    and adenomatous   Osteoarthritis of right shoulder region    Pacemaker    St. Jude CRT-P device   Pseudogout    Sleep apnea    Upper GI bleed    With duodenal ulcer in 2005.  Patient was on ASA and coumadin at that time. ASA was stopped..    Past Surgical History:  Procedure Laterality Date   BIV PACEMAKER GENERATOR CHANGEOUT N/A 01/28/2017   Procedure: BiV Pacemaker Generator Changeout;  Surgeon: [name], MD;  Location: [facility];  Service: Cardiovascular;  Laterality: N/A;   PACEMAKER INSERTION     St. Jude's   SHOULDER SURGERY     Right   TREATMENT FISTULA ANAL      Current Outpatient Medications  Medication  Sig Dispense Refill   carvedilol (COREG) 25 MG tablet Take 25 mg by mouth 2 (two) times daily.     colchicine 0.6 MG tablet Take 0.6 mg by mouth daily as needed (gout flare).      ELIQUIS 5 MG TABS tablet Take 5 mg by mouth 2 (two) times daily.     furosemide (LASIX) 40 MG tablet Take 40 mg by mouth daily as needed (swelling).     hydrochlorothiazide 25 MG tablet Take 25 mg by mouth daily.     Multiple Vitamin (MULTIVITAMIN WITH MINERALS) TABS tablet Take 1 tablet by mouth daily.     omeprazole (PRILOSEC OTC) 20 MG tablet Take 20 mg by mouth daily.     potassium chloride SA (K-DUR,KLOR-CON) 20 MEQ tablet Take 20 mEq by mouth daily as needed (When you take Lasix).     ramipril (ALTACE) 10 MG capsule Take 1 capsule (10 mg total) by mouth daily.     rosuvastatin (CRESTOR) 20 MG tablet Take 20 mg by mouth daily.     traMADol (ULTRAM) 50 MG tablet Take 50 mg by mouth daily as needed for moderate pain.     No current facility-administered medications for this  visit.    No Known Allergies    Review of Systems negative except from HPI and PMH  Physical Exam BP 112/80 (BP Location: Left Arm, Patient Position: Sitting, Cuff Size: Normal)   Pulse 76   Ht 6\' 2"  (1.88 m)   Wt 251 lb (113.9 kg)   SpO2 98%   BMI 32.23 kg/m  Well developed and well nourished in no acute distress HENT normal Neck supple with JVP-flat Clear Device pocket well healed; without hematoma or erythema.  There is no tethering  Regular rate and rhythm, no  gallop No  murmur Abd-soft with active BS No Clubbing cyanosis  edema Skin-warm and dry A & Oriented  Grossly normal sensory and motor function  ECG atrial fibrillation with underlying biventricular pacing with an upright QRS lead V1 and negative QRS V1 Intervals-/15/42   Assessment and  Plan Atrial fibrillation-permanent  Complete heart block status post AV ablation  Pacemaker-CRT-St. Jude  Cardiomyopathy-chronic-diastolic/systolic  Congestive heart  failure-chronic-systolic-class II    Atrial fibrillation permanent continue on Eliquis 5 mg twice daily.  We will check a CBC  Cardiomyopathy-we will continue him on his Altace 10, carvedilol 25 twice daily and his daily diuretic 25 mg; he is euvolemic.  We will check a metabolic profile.  We will reassess left ventricular function has been appropriate to consider medication adjustments including introduction of Entresto, spironolactone and/or SGLT2 depending on left ventricular function        Current medicines are reviewed at length with the patient today .  The patient does not have concerns regarding medicines.\

## 2021-03-28 NOTE — Progress Notes (Signed)
Error

## 2021-03-28 NOTE — Patient Instructions (Signed)
Medication Instructions:  Your physician recommends that you continue on your current medications as directed. Please refer to the Current Medication list given to you today.  *If you need a refill on your cardiac medications before your next appointment, please call your pharmacy*   Lab Work: CBC and BMET today If you have labs (blood work) drawn today and your tests are completely normal, you will receive your results only by: [facility] (if you have MyChart) OR A paper copy in the mail If you have any lab test that is abnormal or we need to change your treatment, we will call you to review the results.   Testing/Procedures: Your physician has requested that you have an echocardiogram. Echocardiography is a painless test that uses sound waves to create images of your heart. It provides your doctor with information about the size and shape of your heart and how well your heart's chambers and valves are working. This procedure takes approximately one hour. There are no restrictions for this procedure.    Follow-Up: At [facility], you and your health needs are our priority.  As part of our continuing mission to provide you with exceptional heart care, we have created designated Provider Care Teams.  These Care Teams include your primary Cardiologist (physician) and Advanced Practice Providers (APPs -  Physician Assistants and Nurse Practitioners) who all work together to provide you with the care you need, when you need it.  We recommend signing up for the patient portal called "MyChart".  Sign up information is provided on this After Visit Summary.  MyChart is used to connect with patients for Virtual Visits (Telemedicine).  Patients are able to view lab/test results, encounter notes, upcoming appointments, etc.  Non-urgent messages can be sent to your provider as well.   To learn more about what you can do with MyChart, go to [email or web address].    Your next appointment:   12  month(s)  The format for your next appointment:   In Person  Provider:   Virl Axe, MD   Other Instructions

## 2021-03-29 LAB — CBC
Hematocrit: 40.8 % (ref 37.5–51.0)
Hemoglobin: 14.5 g/dL (ref 13.0–17.7)
MCH: 32.6 pg (ref 26.6–33.0)
MCHC: 35.5 g/dL (ref 31.5–35.7)
MCV: 92 fL (ref 79–97)
Platelets: 235 10*3/uL (ref 150–450)
RBC: 4.45 x10E6/uL (ref 4.14–5.80)
RDW: 12.8 % (ref 11.6–15.4)
WBC: 9.3 10*3/uL (ref 3.4–10.8)

## 2021-03-29 LAB — BASIC METABOLIC PANEL
BUN/Creatinine Ratio: 22 (ref 10–24)
BUN: 16 mg/dL (ref 8–27)
CO2: 26 mmol/L (ref 20–29)
Calcium: 9.7 mg/dL (ref 8.6–10.2)
Chloride: 101 mmol/L (ref 96–106)
Creatinine, Ser: 0.72 mg/dL — ABNORMAL LOW (ref 0.76–1.27)
Glucose: 91 mg/dL (ref 65–99)
Potassium: 4.2 mmol/L (ref 3.5–5.2)
Sodium: 139 mmol/L (ref 134–144)
eGFR: 96 mL/min/{1.73_m2} (ref >59)

## 2021-04-30 ENCOUNTER — Other Ambulatory Visit: Payer: Self-pay

## 2021-04-30 ENCOUNTER — Ambulatory Visit (HOSPITAL_COMMUNITY): Payer: PPO | Attending: Cardiology

## 2021-04-30 DIAGNOSIS — Z950 Presence of cardiac pacemaker: Secondary | ICD-10-CM | POA: Diagnosis not present

## 2021-04-30 DIAGNOSIS — Z79899 Other long term (current) drug therapy: Secondary | ICD-10-CM | POA: Insufficient documentation

## 2021-04-30 DIAGNOSIS — I4821 Permanent atrial fibrillation: Secondary | ICD-10-CM | POA: Insufficient documentation

## 2021-04-30 DIAGNOSIS — I442 Atrioventricular block, complete: Secondary | ICD-10-CM | POA: Insufficient documentation

## 2021-04-30 LAB — ECHOCARDIOGRAM COMPLETE: S' Lateral: 3.7 cm

## 2021-04-30 MED ORDER — PERFLUTREN LIPID MICROSPHERE
1.0000 mL | INTRAVENOUS | Status: AC | PRN
  Administered 2021-04-30: 3 mL via INTRAVENOUS

## 2021-05-02 ENCOUNTER — Ambulatory Visit (INDEPENDENT_AMBULATORY_CARE_PROVIDER_SITE_OTHER): Payer: PPO

## 2021-05-02 DIAGNOSIS — I442 Atrioventricular block, complete: Secondary | ICD-10-CM

## 2021-05-02 LAB — CUP PACEART REMOTE DEVICE CHECK
Battery Remaining Longevity: 46 mo
Battery Remaining Percentage: 50 %
Battery Voltage: 2.96 V
Date Time Interrogation Session: 20220721020015
Implantable Lead Implant Date: 20000217
Implantable Lead Implant Date: 20100414
Implantable Lead Location: 753858
Implantable Lead Location: 753860
Implantable Lead Model: 4196
Implantable Pulse Generator Implant Date: 20180418
Lead Channel Impedance Value: 430 Ohm
Lead Channel Impedance Value: 990 Ohm
Lead Channel Pacing Threshold Amplitude: 0.75 V
Lead Channel Pacing Threshold Amplitude: 0.75 V
Lead Channel Pacing Threshold Pulse Width: 0.5 ms
Lead Channel Pacing Threshold Pulse Width: 0.8 ms
Lead Channel Sensing Intrinsic Amplitude: 9.9 mV
Lead Channel Setting Pacing Amplitude: 2 V
Lead Channel Setting Pacing Amplitude: 2.5 V
Lead Channel Setting Pacing Pulse Width: 0.5 ms
Lead Channel Setting Pacing Pulse Width: 0.8 ms
Lead Channel Setting Sensing Sensitivity: 4 mV
Pulse Gen Model: 3222
Pulse Gen Serial Number: 8013108

## 2021-05-09 DIAGNOSIS — G4733 Obstructive sleep apnea (adult) (pediatric): Secondary | ICD-10-CM | POA: Diagnosis not present

## 2021-05-24 NOTE — Progress Notes (Signed)
Remote pacemaker transmission.   

## 2021-07-11 DIAGNOSIS — Z125 Encounter for screening for malignant neoplasm of prostate: Secondary | ICD-10-CM | POA: Diagnosis not present

## 2021-07-11 DIAGNOSIS — E785 Hyperlipidemia, unspecified: Secondary | ICD-10-CM | POA: Diagnosis not present

## 2021-07-11 DIAGNOSIS — M109 Gout, unspecified: Secondary | ICD-10-CM | POA: Diagnosis not present

## 2021-07-15 DIAGNOSIS — M1990 Unspecified osteoarthritis, unspecified site: Secondary | ICD-10-CM | POA: Diagnosis not present

## 2021-07-15 DIAGNOSIS — E669 Obesity, unspecified: Secondary | ICD-10-CM | POA: Diagnosis not present

## 2021-07-15 DIAGNOSIS — N401 Enlarged prostate with lower urinary tract symptoms: Secondary | ICD-10-CM | POA: Diagnosis not present

## 2021-07-15 DIAGNOSIS — Z0000 Encounter for general adult medical examination without abnormal findings: Secondary | ICD-10-CM | POA: Diagnosis not present

## 2021-07-15 DIAGNOSIS — Z23 Encounter for immunization: Secondary | ICD-10-CM | POA: Diagnosis not present

## 2021-07-15 DIAGNOSIS — R82998 Other abnormal findings in urine: Secondary | ICD-10-CM | POA: Diagnosis not present

## 2021-07-15 DIAGNOSIS — E785 Hyperlipidemia, unspecified: Secondary | ICD-10-CM | POA: Diagnosis not present

## 2021-07-15 DIAGNOSIS — Z1339 Encounter for screening examination for other mental health and behavioral disorders: Secondary | ICD-10-CM | POA: Diagnosis not present

## 2021-07-15 DIAGNOSIS — I10 Essential (primary) hypertension: Secondary | ICD-10-CM | POA: Diagnosis not present

## 2021-07-15 DIAGNOSIS — Z1331 Encounter for screening for depression: Secondary | ICD-10-CM | POA: Diagnosis not present

## 2021-07-15 DIAGNOSIS — Z1212 Encounter for screening for malignant neoplasm of rectum: Secondary | ICD-10-CM | POA: Diagnosis not present

## 2021-08-01 ENCOUNTER — Ambulatory Visit (INDEPENDENT_AMBULATORY_CARE_PROVIDER_SITE_OTHER): Payer: PPO

## 2021-08-01 DIAGNOSIS — I442 Atrioventricular block, complete: Secondary | ICD-10-CM | POA: Diagnosis not present

## 2021-08-01 LAB — CUP PACEART REMOTE DEVICE CHECK
Battery Remaining Longevity: 41 mo
Battery Remaining Percentage: 47 %
Battery Voltage: 2.96 V
Date Time Interrogation Session: 20221020035635
Implantable Lead Implant Date: 20000217
Implantable Lead Implant Date: 20100414
Implantable Lead Location: 753858
Implantable Lead Location: 753860
Implantable Lead Model: 4196
Implantable Pulse Generator Implant Date: 20180418
Lead Channel Impedance Value: 390 Ohm
Lead Channel Impedance Value: 940 Ohm
Lead Channel Pacing Threshold Amplitude: 0.75 V
Lead Channel Pacing Threshold Amplitude: 0.75 V
Lead Channel Pacing Threshold Pulse Width: 0.5 ms
Lead Channel Pacing Threshold Pulse Width: 0.8 ms
Lead Channel Sensing Intrinsic Amplitude: 9.9 mV
Lead Channel Setting Pacing Amplitude: 2 V
Lead Channel Setting Pacing Amplitude: 2.5 V
Lead Channel Setting Pacing Pulse Width: 0.5 ms
Lead Channel Setting Pacing Pulse Width: 0.8 ms
Lead Channel Setting Sensing Sensitivity: 4 mV
Pulse Gen Model: 3222
Pulse Gen Serial Number: 8013108

## 2021-08-09 DIAGNOSIS — G4733 Obstructive sleep apnea (adult) (pediatric): Secondary | ICD-10-CM | POA: Diagnosis not present

## 2021-08-09 NOTE — Progress Notes (Signed)
Remote pacemaker transmission.   

## 2021-10-11 DIAGNOSIS — I4891 Unspecified atrial fibrillation: Secondary | ICD-10-CM | POA: Diagnosis not present

## 2021-10-11 DIAGNOSIS — M1990 Unspecified osteoarthritis, unspecified site: Secondary | ICD-10-CM | POA: Diagnosis not present

## 2021-10-11 DIAGNOSIS — I10 Essential (primary) hypertension: Secondary | ICD-10-CM | POA: Diagnosis not present

## 2021-10-11 DIAGNOSIS — E785 Hyperlipidemia, unspecified: Secondary | ICD-10-CM | POA: Diagnosis not present

## 2021-10-31 ENCOUNTER — Ambulatory Visit (INDEPENDENT_AMBULATORY_CARE_PROVIDER_SITE_OTHER): Payer: PPO

## 2021-10-31 DIAGNOSIS — I442 Atrioventricular block, complete: Secondary | ICD-10-CM | POA: Diagnosis not present

## 2021-10-31 LAB — CUP PACEART REMOTE DEVICE CHECK
Battery Remaining Longevity: 39 mo
Battery Remaining Percentage: 44 %
Battery Voltage: 2.95 V
Date Time Interrogation Session: 20230119020013
Implantable Lead Implant Date: 20000217
Implantable Lead Implant Date: 20100414
Implantable Lead Location: 753858
Implantable Lead Location: 753860
Implantable Lead Model: 4196
Implantable Pulse Generator Implant Date: 20180418
Lead Channel Impedance Value: 410 Ohm
Lead Channel Impedance Value: 950 Ohm
Lead Channel Pacing Threshold Amplitude: 0.625 V
Lead Channel Pacing Threshold Amplitude: 0.75 V
Lead Channel Pacing Threshold Pulse Width: 0.5 ms
Lead Channel Pacing Threshold Pulse Width: 0.8 ms
Lead Channel Sensing Intrinsic Amplitude: 9.9 mV
Lead Channel Setting Pacing Amplitude: 2 V
Lead Channel Setting Pacing Amplitude: 2.5 V
Lead Channel Setting Pacing Pulse Width: 0.5 ms
Lead Channel Setting Pacing Pulse Width: 0.8 ms
Lead Channel Setting Sensing Sensitivity: 4 mV
Pulse Gen Model: 3222
Pulse Gen Serial Number: 8013108

## 2021-11-07 ENCOUNTER — Ambulatory Visit: Payer: PPO | Admitting: Family Medicine

## 2021-11-07 ENCOUNTER — Encounter: Payer: Self-pay | Admitting: Family Medicine

## 2021-11-07 VITALS — BP 134/78 | HR 95 | Ht 74.0 in | Wt 255.5 lb

## 2021-11-07 DIAGNOSIS — G4733 Obstructive sleep apnea (adult) (pediatric): Secondary | ICD-10-CM

## 2021-11-07 DIAGNOSIS — Z9989 Dependence on other enabling machines and devices: Secondary | ICD-10-CM

## 2021-11-07 NOTE — Patient Instructions (Signed)
Please continue using your CPAP regularly. While your insurance requires that you use CPAP at least 4 hours each night on 70% of the nights, I recommend, that you not skip any nights and use it throughout the night if you can. Getting used to CPAP and staying with the treatment long term does take time and patience and discipline. Untreated obstructive sleep apnea when it is moderate to severe can have an adverse impact on cardiovascular health and raise her risk for heart disease, arrhythmias, hypertension, congestive heart failure, stroke and diabetes. Untreated obstructive sleep apnea causes sleep disruption, nonrestorative sleep, and sleep deprivation. This can have an impact on your day to day functioning and cause daytime sleepiness and impairment of cognitive function, memory loss, mood disturbance, and problems focussing. Using CPAP regularly can improve these symptoms.   Follow up 31-90 days following new CPAP set up.

## 2021-11-07 NOTE — Progress Notes (Signed)
PATIENT: [name] Page DOB: 11-28-1945  REASON FOR VISIT: follow up HISTORY FROM: patient  Chief Complaint  Patient presents with   Obstructive Sleep Apnea    Rm 1, alone. Here for yearly CPAP f/u. Pt reports doing well. No issues or concerns today. Current CPAP was set up 09/01/2014.     HISTORY OF PRESENT ILLNESS:  11/07/21 ALL:  [name] Page is a 76 y.o. male here today for follow up for OSA on CPAP.   He reports he is doing well, and that he has no concerns at this time. His download for the last 30 days shows 100% compliance for both days used and for >4 hours. He is due for a new CPAP, we will order one for him today.     HISTORY: (copied from previous note) 11/01/20 SA: I reviewed his CPAP compliance data from 10/01/2020 through 10/30/2020, which is a total of 30 days, during which time he used his machine every night with percent use days greater than 4 hours at 100%, indicating superb compliance, average usage of 7 hours and 29 minutes, residual AHI at goal at 0.4/h, leak acceptable with a 95th percentile at 9.6 L/min on a pressure of 9 cm with EPR of 2.  He reports doing well.  He is up-to-date with his supplies, uses nasal pillows, gets his supplies quarterly through [facility].  He has no new complaints.  He sees his electrophysiologist in cardiology yearly and his primary care twice yearly.      REVIEW OF SYSTEMS: Out of a complete 14 system review of symptoms, the patient complains only of the following symptoms, none and all other reviewed systems are negative.  ESS: 7  ALLERGIES: No Known Allergies  HOME MEDICATIONS: Outpatient Medications Prior to Visit  Medication Sig Dispense Refill   carvedilol (COREG) 25 MG tablet Take 25 mg by mouth 2 (two) times daily.     colchicine 0.6 MG tablet Take 0.6 mg by mouth daily as needed (gout flare).      ELIQUIS 5 MG TABS tablet Take 5 mg by mouth 2 (two) times daily.     furosemide (LASIX) 40 MG tablet Take 40 mg by mouth  daily as needed (swelling).     hydrochlorothiazide 25 MG tablet Take 25 mg by mouth daily.     Multiple Vitamin (MULTIVITAMIN WITH MINERALS) TABS tablet Take 1 tablet by mouth daily.     omeprazole (PRILOSEC OTC) 20 MG tablet Take 20 mg by mouth daily.     potassium chloride SA (K-DUR,KLOR-CON) 20 MEQ tablet Take 20 mEq by mouth daily as needed (When you take Lasix).     rosuvastatin (CRESTOR) 20 MG tablet Take 20 mg by mouth daily.     traMADol (ULTRAM) 50 MG tablet Take 50 mg by mouth daily as needed for moderate pain.     ramipril (ALTACE) 10 MG capsule Take 1 capsule (10 mg total) by mouth daily.     No facility-administered medications prior to visit.    PAST MEDICAL HISTORY: Past Medical History:  Diagnosis Date   Anal fissure    Anal fistula    Arthritis    Atrial fibrillation ([place])    Blood transfusion without reported diagnosis    Cardiomyopathy    Nonishemic, possibly tachycardia-mediated. 1997 LHC with no significant CAD. Myoview (7/09) showed EF 44% with possible mild inferior ischemia. Echo (9/10): EF 60%   Cataract    CHF (congestive heart failure) (HCC)    Chronic  atrial fibrillation (HCC)    s/p AV nodal ablation and on Coumadin. Patient developed dyspnea/fatigue with RV  pacing and an LV lead was placed   Colon polyp    hyperplastic and adenomatous   HTN (hypertension)    Hyperlipidemia    Hyperplastic colon polyp    and adenomatous   Osteoarthritis of right shoulder region    Pacemaker    St. Jude CRT-P device   Pseudogout    Sleep apnea    Upper GI bleed    With duodenal ulcer in 2005.  Patient was on ASA and coumadin at that time. ASA was stopped.Marland Kitchen    PAST SURGICAL HISTORY: Past Surgical History:  Procedure Laterality Date   BIV PACEMAKER GENERATOR CHANGEOUT N/A 01/28/2017   Procedure: BiV Pacemaker Generator Changeout;  Surgeon: [name], MD;  Location: [facility];  Service: Cardiovascular;  Laterality: N/A;   PACEMAKER INSERTION      St. Jude's   SHOULDER SURGERY     Right   TREATMENT FISTULA ANAL      FAMILY HISTORY: Family History  Problem Relation Age of Onset   Heart disease Mother    Stroke Father    Diverticulitis Sister    Diabetes Sister    Diabetes Sister    Colon cancer Neg Hx     SOCIAL HISTORY: Social History   Socioeconomic History   Marital status: Married    Spouse name: Di Kindle   Number of children: 0   Years of education: some coll.   Highest education level: Not on file  Occupational History   Occupation: Scientist, clinical (histocompatibility and immunogenetics): [employer]  Tobacco Use   Smoking status: Never   Smokeless tobacco: Never  Vaping Use   Vaping Use: Never used  Substance and Sexual Activity   Alcohol use: Yes    Alcohol/week: 0.0 standard drinks    Comment: Very rare   Drug use: No   Sexual activity: Not on file  Other Topics Concern   Not on file  Social History Narrative   Lives in [place].   Does a lot of traveling.   Exercises daily.   Daily Caffeine use.   Patient is right handed   Social Determinants of Health   Financial Resource Strain: Not on file  Food Insecurity: Not on file  Transportation Needs: Not on file  Physical Activity: Not on file  Stress: Not on file  Social Connections: Not on file  Intimate Partner Violence: Not on file     PHYSICAL EXAM  Vitals:   11/07/21 1253  BP: 134/78  Pulse: 95  SpO2: 99%  Weight: 115.9 kg  Height: 6\' 2"  (1.88 m)   Body mass index is 32.8 kg/m.  Generalized: Well developed, in no acute distress  Cardiology: normal rate and rhythm, no murmur noted Respiratory: clear to auscultation bilaterally  Neurological examination  Mentation: Alert oriented to time, place, history taking. Follows all commands speech and language fluent Cranial nerve II-XII: Pupils were equal round reactive to light. Extraocular movements were full, visual field were full  Motor: The motor testing reveals 5 over 5 strength of all 4 extremities.  Good symmetric motor tone is noted throughout.  Gait and station: Gait is normal.    DIAGNOSTIC DATA (LABS, IMAGING, TESTING) - I reviewed patient records, labs, notes, testing and imaging myself where available.  No flowsheet data found.   Lab Results  Component Value Date   WBC 9.3 03/28/2021   HGB  14.5 03/28/2021   HCT 40.8 03/28/2021   MCV 92 03/28/2021   PLT 235 03/28/2021      Component Value Date/Time   NA 139 03/28/2021 1458   K 4.2 03/28/2021 1458   CL 101 03/28/2021 1458   CO2 26 03/28/2021 1458   GLUCOSE 91 03/28/2021 1458   GLUCOSE 104 (H) 01/16/2009 1206   BUN 16 03/28/2021 1458   CREATININE 0.72 (L) 03/28/2021 1458   CALCIUM 9.7 03/28/2021 1458   GFRNONAA 91 01/21/2017 1100   GFRAA 105 01/21/2017 1100   No results found for: CHOL, HDL, LDLCALC, LDLDIRECT, TRIG, CHOLHDL No results found for: HGBA1C No results found for: VITAMINB12 No results found for: TSH   ASSESSMENT AND PLAN 76 y.o. year old male  has a past medical history of Anal fissure, Anal fistula, Arthritis, Atrial fibrillation ([place]), Blood transfusion without reported diagnosis, Cardiomyopathy, Cataract, CHF (congestive heart failure) ([place]), Chronic atrial fibrillation ([place]), Colon polyp, HTN (hypertension), Hyperlipidemia, Hyperplastic colon polyp, Osteoarthritis of right shoulder region, Pacemaker, Pseudogout, Sleep apnea, and Upper GI bleed. here with     ICD-10-CM   1. OSA on CPAP  G47.33    Z99.89         JERRIAN MELLS is doing well on CPAP therapy. Compliance report reveals 100%. He was encouraged to continue using CPAP nightly and for greater than 4 hours each night. Risks of untreated sleep apnea review and education materials provided. Healthy lifestyle habits encouraged. He is due for a new CPAP, we will order one for him today. We will follow up in pending new CPAP set up. He verbalizes understanding and agreement with this plan.    No orders of the defined types were placed in  this encounter.    No orders of the defined types were placed in this encounter.   Debbora Presto, FNP-C 11/07/2021, 1:11 PM [facility] [address], [place], [place] [address] [phone]

## 2021-11-10 DIAGNOSIS — M1990 Unspecified osteoarthritis, unspecified site: Secondary | ICD-10-CM | POA: Diagnosis not present

## 2021-11-10 DIAGNOSIS — E785 Hyperlipidemia, unspecified: Secondary | ICD-10-CM | POA: Diagnosis not present

## 2021-11-10 DIAGNOSIS — I10 Essential (primary) hypertension: Secondary | ICD-10-CM | POA: Diagnosis not present

## 2021-11-10 DIAGNOSIS — I4891 Unspecified atrial fibrillation: Secondary | ICD-10-CM | POA: Diagnosis not present

## 2021-11-11 DIAGNOSIS — G4733 Obstructive sleep apnea (adult) (pediatric): Secondary | ICD-10-CM | POA: Diagnosis not present

## 2021-11-11 NOTE — Progress Notes (Signed)
Faxed order to DME: [facility] P: [phone] and [phone]  **Received fax confirmations**

## 2021-11-13 NOTE — Progress Notes (Signed)
Remote pacemaker transmission.   

## 2021-11-19 DIAGNOSIS — G4733 Obstructive sleep apnea (adult) (pediatric): Secondary | ICD-10-CM | POA: Diagnosis not present

## 2021-12-10 DIAGNOSIS — H40033 Anatomical narrow angle, bilateral: Secondary | ICD-10-CM | POA: Diagnosis not present

## 2021-12-10 DIAGNOSIS — H25813 Combined forms of age-related cataract, bilateral: Secondary | ICD-10-CM | POA: Diagnosis not present

## 2021-12-17 DIAGNOSIS — G4733 Obstructive sleep apnea (adult) (pediatric): Secondary | ICD-10-CM | POA: Diagnosis not present

## 2021-12-31 DIAGNOSIS — H40031 Anatomical narrow angle, right eye: Secondary | ICD-10-CM | POA: Diagnosis not present

## 2022-01-07 DIAGNOSIS — N471 Phimosis: Secondary | ICD-10-CM | POA: Diagnosis not present

## 2022-01-13 ENCOUNTER — Telehealth: Payer: Self-pay | Admitting: *Deleted

## 2022-01-13 ENCOUNTER — Telehealth: Payer: Self-pay | Admitting: Internal Medicine

## 2022-01-13 NOTE — Telephone Encounter (Signed)
Please set up virtual telephone visit with preop APP for cardiac clearance ?

## 2022-01-13 NOTE — Telephone Encounter (Signed)
Clinical pharmacist to review Eliquis 

## 2022-01-13 NOTE — Telephone Encounter (Signed)
Patient with diagnosis of atrial fibrillation on Eliquis for anticoagulation.   ? ?Procedure: circumcision ?Date of procedure: TBD ? ? ?CHA2DS2-VASc Score = 3  ? This indicates a 3.2% annual risk of stroke. ?The patient's score is based upon: ?CHF History: 1 ?HTN History: 1 ?Diabetes History: 0 ?Stroke History: 0 ?Vascular Disease History: 0 ?Age Score: 1 ?Gender Score: 0 ?  ?CrCl 122 ?Platelet count 235 ? ?Per office protocol, patient can hold Eliquis for 3 days prior to procedure.   ?Patient will not need bridging with Lovenox (enoxaparin) around procedure. ? ?For orthopedic procedures please be sure to resume therapeutic (not prophylactic) dosing. ? ?

## 2022-01-13 NOTE — Telephone Encounter (Signed)
? ?  Pre-operative Risk Assessment  ?  ?Patient Name: [name] Page  ?DOB: June 19, 1946 ?MRN: [id number]  ? ?  ? ?Request for Surgical Clearance   ? ?Procedure:   Circumcision ? ?Date of Surgery:  Clearance TBD                              ?   ?Surgeon:  Dr. Rexene Alberts ?Surgeon's Group or Practice Name:  [facility]  ?Phone number:  [phone] Ext. [phone] ?Fax number:  [phone] ?  ?Type of Clearance Requested:   ?- Medical  ?- Pharmacy:  Hold Apixaban (Eliquis) 3 Days Prior ?  ?Type of Anesthesia:  General  ?  ?Additional requests/questions:  Please fax a copy of Medical Clearance to the surgeon's office. ? ?Signed, ?Durel Salts   ?01/13/2022, 10:51 AM   ?

## 2022-01-13 NOTE — Telephone Encounter (Signed)
Pt agreeable to plan of care fore tele pre op appt 01/14/22 @ 4 pm. Pt has completed med rec and consent.  ? ?  ?Patient Consent for Virtual Visit  ? ? ?   ? ?[name] Page has provided verbal consent on 01/13/2022 for a virtual visit (video or telephone). ? ? ?CONSENT FOR VIRTUAL VISIT FOR:  [name] Page  ?By participating in this virtual visit I agree to the following: ? ?I hereby voluntarily request, consent and authorize [facility] and its employed or contracted physicians, physician assistants, nurse practitioners or other licensed health care professionals (the Practitioner), to provide me with telemedicine health care services (the ?Services") as deemed necessary by the treating Practitioner. I acknowledge and consent to receive the Services by the Practitioner via telemedicine. I understand that the telemedicine visit will involve communicating with the Practitioner through live audiovisual communication technology and the disclosure of certain medical information by electronic transmission. I acknowledge that I have been given the opportunity to request an in-person assessment or other available alternative prior to the telemedicine visit and am voluntarily participating in the telemedicine visit. ? ?I understand that I have the right to withhold or withdraw my consent to the use of telemedicine in the course of my care at any time, without affecting my right to future care or treatment, and that the Practitioner or I may terminate the telemedicine visit at any time. I understand that I have the right to inspect all information obtained and/or recorded in the course of the telemedicine visit and may receive copies of available information for a reasonable fee.  I understand that some of the potential risks of receiving the Services via telemedicine include:  ?Delay or interruption in medical evaluation due to technological equipment failure or disruption; ?Information transmitted may not be sufficient  (e.g. poor resolution of images) to allow for appropriate medical decision making by the Practitioner; and/or  ?In rare instances, security protocols could fail, causing a breach of personal health information. ? ?Furthermore, I acknowledge that it is my responsibility to provide information about my medical history, conditions and care that is complete and accurate to the best of my ability. I acknowledge that Practitioner's advice, recommendations, and/or decision may be based on factors not within their control, such as incomplete or inaccurate data provided by me or distortions of diagnostic images or specimens that may result from electronic transmissions. I understand that the practice of medicine is not an exact science and that Practitioner makes no warranties or guarantees regarding treatment outcomes. I acknowledge that a copy of this consent can be made available to me via my patient portal ([facility]), or I can request a printed copy by calling the office of [facility].   ? ?I understand that my insurance will be billed for this visit.  ? ?I have read or had this consent read to me. ?I understand the contents of this consent, which adequately explains the benefits and risks of the Services being provided via telemedicine.  ?I have been provided ample opportunity to ask questions regarding this consent and the Services and have had my questions answered to my satisfaction. ?I give my informed consent for the services to be provided through the use of telemedicine in my medical care ? ? ? ?

## 2022-01-13 NOTE — Telephone Encounter (Signed)
Pt agreeable to plan of care fore tele pre op appt 01/14/22 @ 4 pm. Pt has completed med rec and consent.  ?

## 2022-01-14 ENCOUNTER — Ambulatory Visit (INDEPENDENT_AMBULATORY_CARE_PROVIDER_SITE_OTHER): Payer: PPO | Admitting: Physician Assistant

## 2022-01-14 DIAGNOSIS — H40032 Anatomical narrow angle, left eye: Secondary | ICD-10-CM | POA: Diagnosis not present

## 2022-01-14 DIAGNOSIS — Z01810 Encounter for preprocedural cardiovascular examination: Secondary | ICD-10-CM | POA: Diagnosis not present

## 2022-01-14 NOTE — Progress Notes (Signed)
? ?Virtual Visit via Telephone Note  ? ?This visit type was conducted due to national recommendations for restrictions regarding the COVID-19 Pandemic (e.g. social distancing) in an effort to limit this patient's exposure and mitigate transmission in our community.  Due to his co-morbid illnesses, this patient is at least at moderate risk for complications without adequate follow up.  This format is felt to be most appropriate for this patient at this time.  The patient did not have access to video technology/had technical difficulties with video requiring transitioning to audio format only (telephone).  All issues noted in this document were discussed and addressed.  No physical exam could be performed with this format.  Please refer to the patient's chart for his  consent to telehealth for [facility]. ? ?Evaluation Performed:  Preoperative cardiovascular risk assessment ?_____________  ? ?Date:  01/14/2022  ? ?Patient ID:  [name], Page [birth year], MRN [id number] ?Patient Location:  ?Home ?Provider location:   ?Office ? ?Primary Care Provider:  [name], MD ?Primary Cardiologist:  Virl Axe, MD ? ?Chief Complaint  ?  ?76 y.o. y/o male with a h/o permanent atrial fibrillation, history of AV nodal ablation on CRT-P, hypertension, hyperlipidemia, history of tachycardia mediated cardiomyopathy with normalized EF, who is pending circumcision, and presents today for telephonic preoperative cardiovascular risk assessment. ? ?Past Medical History  ?  ?Past Medical History:  ?Diagnosis Date  ? Anal fissure   ? Anal fistula   ? Arthritis   ? Atrial fibrillation ([place])   ? Blood transfusion without reported diagnosis   ? Cardiomyopathy   ? Nonishemic, possibly tachycardia-mediated. 1997 LHC with no significant CAD. Myoview (7/09) showed EF 44% with possible mild inferior ischemia. Echo (9/10): EF 60%  ? Cataract   ? CHF (congestive heart failure) ([place])   ? Chronic atrial fibrillation (HCC)   ? s/p AV nodal  ablation and on Coumadin. Patient developed dyspnea/fatigue with RV  pacing and an LV lead was placed  ? Colon polyp   ? hyperplastic and adenomatous  ? HTN (hypertension)   ? Hyperlipidemia   ? Hyperplastic colon polyp   ? and adenomatous  ? Osteoarthritis of right shoulder region   ? Pacemaker   ? St. Jude CRT-P device  ? Pseudogout   ? Sleep apnea   ? Upper GI bleed   ? With duodenal ulcer in 2005.  Patient was on ASA and coumadin at that time. ASA was stopped..  ? ?Past Surgical History:  ?Procedure Laterality Date  ? [place] N/A 01/28/2017  ? Procedure: BiV Pacemaker Generator Changeout;  Surgeon: [name], MD;  Location: [facility];  Service: Cardiovascular;  Laterality: N/A;  ? PACEMAKER INSERTION    ? St. Jude's  ? SHOULDER SURGERY    ? Right  ? TREATMENT FISTULA ANAL    ? ? ?Allergies ? ?No Known Allergies ? ?History of Present Illness  ?  ?[name] Page is a 76 y.o. male who presents via audio/video conferencing for a telehealth visit today.  Pt was last seen in cardiology clinic on 03/29/2019 by Dr. Caryl Comes.  At that time [name] Page was doing well.  The patient is now pending circumcision surgery.  Since his last visit, he has been doing well without exertional chest pain or worsening shortness of breath.  Previous echocardiogram obtained in 2022 showed normal EF ? ? ?Home Medications  ?  ?Prior to Admission medications   ?Medication Sig Start Date  End Date Taking? Authorizing Provider  ?carvedilol (COREG) 25 MG tablet Take 25 mg by mouth 2 (two) times daily.    [provider]  ?colchicine 0.6 MG tablet Take 0.6 mg by mouth daily as needed (gout flare).  03/01/15   [provider]  ?ELIQUIS 5 MG TABS tablet Take 5 mg by mouth 2 (two) times daily. 06/01/15   [provider]  ?furosemide (LASIX) 40 MG tablet Take 40 mg by mouth daily as needed (swelling).    [provider]  ?hydrochlorothiazide 25 MG tablet Take 25 mg by mouth  daily.    [provider]  ?Multiple Vitamin (MULTIVITAMIN WITH MINERALS) TABS tablet Take 1 tablet by mouth daily.    [provider]  ?omeprazole (PRILOSEC OTC) 20 MG tablet Take 20 mg by mouth daily.    [provider]  ?potassium chloride SA (K-DUR,KLOR-CON) 20 MEQ tablet Take 20 mEq by mouth daily as needed (When you take Lasix).    [provider]  ?ramipril (ALTACE) 10 MG capsule Take 1 capsule (10 mg total) by mouth daily. ?Patient taking differently: Take 10 mg by mouth 2 (two) times daily. 05/26/17 01/13/22  [name], MD  ?rosuvastatin (CRESTOR) 20 MG tablet Take 20 mg by mouth daily.    [provider]  ?traMADol (ULTRAM) 50 MG tablet Take 50 mg by mouth daily as needed for moderate pain. ?Patient not taking: Reported on 01/13/2022    [provider]  ? ? ?Physical Exam  ?  ?Vital Signs:  [name] Page does not have vital signs available for review today. ? ?Given telephonic nature of communication, physical exam is limited. ?AAOx3. NAD. Normal affect.  Speech and respirations are unlabored. ? ?Accessory Clinical Findings  ?  ?None ? ?Assessment & Plan  ?  ?1.  Preoperative Cardiovascular Risk Assessment: ? -Patient has upcoming circumcision surgery.  He has a history of chronic atrial fibrillation, status post AV nodal ablation and pacemaker implant.  He also has a history of tachycardia mediated cardiomyopathy with normalization of ejection fraction.  He denies any recent chest pain or worsening dyspnea.  He occasionally plays golf and has no problem walking long distance.  He is cleared to proceed with circumcision surgery.  He may hold Eliquis for 3 days prior to the procedure and restart as soon as possible afterward at the surgeon's discretion. ? ?A copy of this note will be routed to requesting surgeon. ? ?Time:   ?Today, I have spent 5 minutes with the patient with telehealth technology discussing medical history, symptoms, and management  plan.   ? ? ?Almyra Deforest, [place] ? ?01/14/2022, 4:18 PM ? ?

## 2022-01-17 DIAGNOSIS — G4733 Obstructive sleep apnea (adult) (pediatric): Secondary | ICD-10-CM | POA: Diagnosis not present

## 2022-01-20 DIAGNOSIS — Z789 Other specified health status: Secondary | ICD-10-CM | POA: Diagnosis not present

## 2022-01-20 DIAGNOSIS — E669 Obesity, unspecified: Secondary | ICD-10-CM | POA: Diagnosis not present

## 2022-01-20 DIAGNOSIS — M1990 Unspecified osteoarthritis, unspecified site: Secondary | ICD-10-CM | POA: Diagnosis not present

## 2022-01-20 DIAGNOSIS — I10 Essential (primary) hypertension: Secondary | ICD-10-CM | POA: Diagnosis not present

## 2022-01-20 DIAGNOSIS — M62830 Muscle spasm of back: Secondary | ICD-10-CM | POA: Diagnosis not present

## 2022-01-27 ENCOUNTER — Other Ambulatory Visit: Payer: Self-pay | Admitting: Urology

## 2022-01-30 ENCOUNTER — Ambulatory Visit (INDEPENDENT_AMBULATORY_CARE_PROVIDER_SITE_OTHER): Payer: PPO

## 2022-01-30 DIAGNOSIS — I442 Atrioventricular block, complete: Secondary | ICD-10-CM | POA: Diagnosis not present

## 2022-01-30 LAB — CUP PACEART REMOTE DEVICE CHECK
Battery Remaining Longevity: 37 mo
Battery Remaining Percentage: 41 %
Battery Voltage: 2.95 V
Date Time Interrogation Session: 20230420020015
Implantable Lead Implant Date: 20000217
Implantable Lead Implant Date: 20100414
Implantable Lead Location: 753858
Implantable Lead Location: 753860
Implantable Lead Model: 4196
Implantable Pulse Generator Implant Date: 20180418
Lead Channel Impedance Value: 1100 Ohm
Lead Channel Impedance Value: 410 Ohm
Lead Channel Pacing Threshold Amplitude: 0.75 V
Lead Channel Pacing Threshold Amplitude: 0.75 V
Lead Channel Pacing Threshold Pulse Width: 0.5 ms
Lead Channel Pacing Threshold Pulse Width: 0.8 ms
Lead Channel Sensing Intrinsic Amplitude: 12 mV
Lead Channel Setting Pacing Amplitude: 2 V
Lead Channel Setting Pacing Amplitude: 2.5 V
Lead Channel Setting Pacing Pulse Width: 0.5 ms
Lead Channel Setting Pacing Pulse Width: 0.8 ms
Lead Channel Setting Sensing Sensitivity: 4 mV
Pulse Gen Model: 3222
Pulse Gen Serial Number: 8013108

## 2022-02-03 DIAGNOSIS — H40033 Anatomical narrow angle, bilateral: Secondary | ICD-10-CM | POA: Diagnosis not present

## 2022-02-03 DIAGNOSIS — H04123 Dry eye syndrome of bilateral lacrimal glands: Secondary | ICD-10-CM | POA: Diagnosis not present

## 2022-02-05 ENCOUNTER — Encounter: Payer: Self-pay | Admitting: Internal Medicine

## 2022-02-05 NOTE — Progress Notes (Signed)
PERIOPERATIVE PRESCRIPTION FOR IMPLANTED CARDIAC DEVICE PROGRAMMING ? ?Patient Information: ?Name:  [name] Page  ?DOB:  [birth year]  ?MRN:  [id number]  ?  ?Planned Procedure: Circumcision  ?Surgeon:  Dr. Rexene Alberts  ?Date of Procedure:  02-07-22  ?Cautery will be used.  ?Position during surgery:  Supine  ? ?Please send documentation back to:  ?[facility] (Fax # [phone])  ?Device Information: ? ?Clinic EP Physician:  Virl Axe, MD  ? ?Device Type:  Pacemaker ?Armed forces logistics/support/administrative officer #:  St. Jude/[place]: [phone] ?Pacemaker Dependent?:  Unknown ?Date of Last Device Check:  01/30/2022 (Remote) Normal Device Function?:  Yes.   ? ?Electrophysiologist's Recommendations: ? ?Have magnet available. ?Provide continuous ECG monitoring when magnet is used or reprogramming is to be performed.  ?Procedure should not interfere with device function.  No device programming or magnet placement needed. ? ?Per Device Clinic Standing Orders, ?Simone Curia, RN  ?8:49 AM 02/05/2022  ?

## 2022-02-05 NOTE — Patient Instructions (Addendum)
DUE TO COVID-19 ONLY TWO VISITORS  (aged 73 and older)  IS ALLOWED TO COME WITH YOU AND STAY IN THE WAITING ROOM ONLY DURING PRE OP AND PROCEDURE.   ?**NO VISITORS ARE ALLOWED IN THE SHORT STAY AREA OR RECOVERY ROOM!!** ? ? ?You are not required to quarantine ?Hand Hygiene often ?Do NOT share personal items ?Notify your provider if you are in close contact with someone who has COVID or you develop fever 100.4 or greater, new onset of sneezing, cough, sore throat, shortness of breath or body aches. ? ?     ? Your procedure is scheduled on:  02-07-22 ? ? Report to [facility] Main Entrance ? ?  Report to admitting at 10:45 AM ? ? Call this number if you have problems the morning of surgery [phone] ? ? Do not eat food :After Midnight. ? ? After Midnight you may have the following liquids until 10:00 AM DAY OF SURGERY ? ?Water ?Black Coffee (sugar ok, NO MILK/CREAM OR CREAMERS)  ?Tea (sugar ok, NO MILK/CREAM OR CREAMERS) regular and decaf                             ?Plain Jell-O (NO RED)                                           ?Fruit ices (not with fruit pulp, NO RED)                                     ?Popsicles (NO RED)                                                                  ?Juice: apple, WHITE grape, WHITE cranberry ?Sports drinks like Gatorade (NO RED) ?Clear broth(vegetable,chicken,beef) ? ? ?FOLLOW ANY ADDITIONAL PRE OP INSTRUCTIONS YOU RECEIVED FROM YOUR SURGEON'S OFFICE!!! ?  ?  ?Oral Hygiene is also important to reduce your risk of infection.                                    ?Remember - BRUSH YOUR TEETH THE MORNING OF SURGERY WITH YOUR REGULAR TOOTHPASTE ? ? Do NOT smoke after Midnight ? ? Take these medicines the morning of surgery with A SIP OF WATER:  Carvediolol, Colchicine, Rosuvastatin ? ? Hold Eliquis 3 days prior to surgery  ? ?Bring CPAP mask and tubing day of surgery. ?                  ?           You may not have any metal on your body including  jewelry, and body  piercing ? ?           Do not wear  lotions, powders, cologne, or deodorant ? ?            Men may shave face and neck. ? ? Do not bring valuables to the hospital.  [facility] IS NOT RESPONSIBLE   FOR VALUABLES. ? ? Contacts, dentures or bridgework may not be worn into surgery. ? ?Patients discharged on the day of surgery will not be allowed to drive home.  Someone NEEDS to stay with you for the first 24 hours after anesthesia. ? ?Special Instructions: Bring a copy of your healthcare power of attorney and living will documents the day of surgery if you haven't scanned them before. ? ?Please read over the following fact sheets you were given: IF [name] Page [phone] Gen  ? ?[place] - Preparing for Surgery ?Before surgery, you can play an important role.  Because skin is not sterile, your skin needs to be as free of germs as possible.  You can reduce the number of germs on your skin by washing with CHG (chlorahexidine gluconate) soap before surgery.  CHG is an antiseptic cleaner which kills germs and bonds with the skin to continue killing germs even after washing. ?Please DO NOT use if you have an allergy to CHG or antibacterial soaps.  If your skin becomes reddened/irritated stop using the CHG and inform your nurse when you arrive at Short Stay. ?Do not shave (including legs and underarms) for at least 48 hours prior to the first CHG shower.  You may shave your face/neck. ? ?Please follow these instructions carefully: ? 1.  Shower with CHG Soap the night before surgery and the  morning of surgery. ? 2.  If you choose to wash your hair, wash your hair first as usual with your normal  shampoo. ? 3.  After you shampoo, rinse your hair and body thoroughly to remove the shampoo.                            ? 4.  Use CHG as you would any other liquid soap.  You can apply chg directly to the skin and wash.  Gently with a scrungie or clean washcloth. ? 5.  Apply the  CHG Soap to your body ONLY FROM THE NECK DOWN.   Do   not use on face/ open      ?                     Wound or open sores. Avoid contact with eyes, ears mouth and   genitals (private parts).  ?                     Production manager,  Genitals (private parts) with your normal soap. ?            6.  Wash thoroughly, paying special attention to the area where your    surgery  will be performed. ? 7.  Thoroughly rinse your body with warm water from the neck down. ? 8.  DO NOT shower/wash with your normal soap after using and rinsing off the CHG Soap. ?               9.  Pat yourself dry with a clean towel. ?           10.  Wear clean pajamas. ?           11.  Place clean sheets on your bed the night of your first shower and do not  sleep with pets. ?Day of Surgery : ?Do not apply any lotions/deodorants the morning of surgery.  Please  wear clean clothes to the hospital/surgery center. ? ?FAILURE TO FOLLOW THESE INSTRUCTIONS MAY RESULT IN THE CANCELLATION OF YOUR SURGERY ? ?PATIENT SIGNATURE_________________________________ ? ?NURSE SIGNATURE__________________________________ ? ?________________________________________________________________________  ?  ?

## 2022-02-05 NOTE — Progress Notes (Addendum)
COVID Vaccine Completed:  Yes x2 ?Date COVID Vaccine completed: ?Has received booster:  Yes x2 ?COVID vaccine manufacturer: Pfizer     ? ?Date of COVID positive in last 90 days:  No ? ?PCP - [name], MD ?Cardiologist - Virl Axe, MD ? ?Cardiac clearance in Epic dated 01-14-22 by Almyra Deforest, PA ? ?Chest x-ray - N/A ?EKG - 03-28-21 Epic ?Stress Test - greater than 2 years  ?ECHO - 04-30-21 Epic ?Cardiac Cath - greater than 2 years ?Pacemaker/ICD device last checked: 01-30-22 Epic ?Spinal Cord Stimulator: ? ?Bowel Prep - N/A ? ?Sleep Study - Yes, +sleep apnea ?CPAP - Yes ? ?Fasting Blood Sugar - N/A ?Checks Blood Sugar _____ imes a day ? ?Blood Thinner Instructions: Eliquis.  Hold x3 days.  Last dose 02-04-22 per patient ?Aspirin Instructions: ?Last Dose:   ? ?Activity level:   Can go up a flight of stairs and perform activities of daily living without stopping and without symptoms of chest pain or shortness of breath.  Able to exercise without symptoms, plays golf regularly ? ?Anesthesia review:  Afib, AV block, CHF, HTN, cardiomyopathy Pacemaker ? ?Patient denies shortness of breath, fever, cough and chest pain at PAT appointment ? ?Patient verbalized understanding of instructions that were given to them at the PAT appointment. Patient was also instructed that they will need to review over the PAT instructions again at home before surgery.  ?

## 2022-02-06 ENCOUNTER — Encounter (HOSPITAL_COMMUNITY)
Admission: RE | Admit: 2022-02-06 | Discharge: 2022-02-06 | Disposition: A | Discharge status: Home / Self Care | Payer: PPO | Source: Ambulatory Visit | Attending: Urology | Admitting: Urology

## 2022-02-06 ENCOUNTER — Encounter (HOSPITAL_COMMUNITY): Payer: Self-pay

## 2022-02-06 ENCOUNTER — Other Ambulatory Visit: Payer: Self-pay

## 2022-02-06 VITALS — BP 148/82 | HR 96 | Temp 97.9°F | Resp 18 | Ht 74.0 in | Wt 250.0 lb

## 2022-02-06 DIAGNOSIS — Z01812 Encounter for preprocedural laboratory examination: Secondary | ICD-10-CM | POA: Diagnosis not present

## 2022-02-06 DIAGNOSIS — I429 Cardiomyopathy, unspecified: Secondary | ICD-10-CM | POA: Diagnosis not present

## 2022-02-06 DIAGNOSIS — Z01818 Encounter for other preprocedural examination: Secondary | ICD-10-CM

## 2022-02-06 DIAGNOSIS — I509 Heart failure, unspecified: Secondary | ICD-10-CM | POA: Diagnosis not present

## 2022-02-06 DIAGNOSIS — I482 Chronic atrial fibrillation, unspecified: Secondary | ICD-10-CM | POA: Insufficient documentation

## 2022-02-06 DIAGNOSIS — K219 Gastro-esophageal reflux disease without esophagitis: Secondary | ICD-10-CM | POA: Insufficient documentation

## 2022-02-06 DIAGNOSIS — I110 Hypertensive heart disease with heart failure: Secondary | ICD-10-CM | POA: Insufficient documentation

## 2022-02-06 DIAGNOSIS — Z950 Presence of cardiac pacemaker: Secondary | ICD-10-CM | POA: Insufficient documentation

## 2022-02-06 DIAGNOSIS — I2510 Atherosclerotic heart disease of native coronary artery without angina pectoris: Secondary | ICD-10-CM

## 2022-02-06 DIAGNOSIS — N471 Phimosis: Secondary | ICD-10-CM | POA: Insufficient documentation

## 2022-02-06 HISTORY — DX: Gastro-esophageal reflux disease without esophagitis: K21.9

## 2022-02-06 LAB — CBC
HCT: 43.1 % (ref 39.0–52.0)
Hemoglobin: 15 g/dL (ref 13.0–17.0)
MCH: 32.5 pg (ref 26.0–34.0)
MCHC: 34.8 g/dL (ref 30.0–36.0)
MCV: 93.3 fL (ref 80.0–100.0)
Platelets: 216 10*3/uL (ref 150–400)
RBC: 4.62 MIL/uL (ref 4.22–5.81)
RDW: 12.9 % (ref 11.5–15.5)
WBC: 8.5 10*3/uL (ref 4.0–10.5)
nRBC: 0 % (ref 0.0–0.2)

## 2022-02-06 LAB — BASIC METABOLIC PANEL
Anion gap: 3 — ABNORMAL LOW (ref 5–15)
BUN: 19 mg/dL (ref 8–23)
CO2: 27 mmol/L (ref 22–32)
Calcium: 9.3 mg/dL (ref 8.9–10.3)
Chloride: 107 mmol/L (ref 98–111)
Creatinine, Ser: 0.67 mg/dL (ref 0.61–1.24)
GFR, Estimated: >60 mL/min (ref >60)
Glucose, Bld: 97 mg/dL (ref 70–99)
Potassium: 3.9 mmol/L (ref 3.5–5.1)
Sodium: 137 mmol/L (ref 135–145)

## 2022-02-06 NOTE — Progress Notes (Signed)
Anesthesia Chart Review ? ? Case: [id number] Date/Time: 02/07/22 1245  ? Procedure: CIRCUMCISION ADULT  ? Anesthesia type: General  ? Pre-op diagnosis: PHIMOSIS  ? Location: [facility] ROOM 03 / [facility]  ? Surgeons: Janith Lima, MD  ? ?  ? ? ?DISCUSSION:76 y.o. never smoker with h/o HTN, sleep apnea, GERD, atrial fibrillation, history of tachycardia mediated cardiomyopathy with normalized EF, pacemaker in place (device orders in 02/05/2022 progress note, procedure should not interfere), phimosis scheduled for above procedure 02/07/2022 with Dr. Rexene Alberts.  ? ?Pt last seen by cardiology 01/14/2022. Per OV note, " -Patient has upcoming circumcision surgery.  He has a history of chronic atrial fibrillation, status post AV nodal ablation and pacemaker implant.  He also has a history of tachycardia mediated cardiomyopathy with normalization of ejection fraction.  He denies any recent chest pain or worsening dyspnea.  He occasionally plays golf and has no problem walking long distance.  He is cleared to proceed with circumcision surgery.  He may hold Eliquis for 3 days prior to the procedure and restart as soon as possible afterward at the surgeon's discretion." ? ?Pt reports last dose of Eliquis 02/04/2022.  ? ?Anticipate pt can proceed with planned procedure barring acute status change.   ?VS: BP (!) 148/82   Pulse 96   Temp 36.6 ?C (Oral)   Resp 18   Ht '6\' 2"'$  (1.88 m)   Wt 113.4 kg   SpO2 100%   BMI 32.10 kg/m?  ? ?PROVIDERS: ?[name], MD is PCP  ? ?Primary Cardiologist:  Virl Axe, MD ? ?LABS: Labs reviewed: Acceptable for surgery. ?(all labs ordered are listed, but only abnormal results are displayed) ? ?Labs Reviewed  ?BASIC METABOLIC PANEL - Abnormal; Notable for the following components:  ?    Result Value  ? Anion gap 3 (*)   ? All other components within normal limits  ?CBC  ? ? ? ?IMAGES: ? ? ?EKG: ?03/28/2021 ?Rate 76 bpm  ? ? ?CV: ?Echo 04/30/2021 ?1. Left ventricular ejection fraction, by estimation,  is 50 to 55%. The  ?left ventricle has low normal function. There is mild left ventricular  ?hypertrophy. Left ventricular diastolic parameters are indeterminate.  ? 2. Right ventricular systolic function is mildly reduced. The right  ?ventricular size is normal. There is normal pulmonary artery systolic  ?pressure. The estimated right ventricular systolic pressure is 72.5 mmHg.  ? 3. Left atrial size was mildly dilated.  ? 4. The mitral valve is normal in structure. No evidence of mitral valve  ?regurgitation.  ? 5. The aortic valve is tricuspid. Aortic valve regurgitation is not  ?visualized. No aortic stenosis is present.  ? 6. The inferior vena cava is dilated in size with >50% respiratory  ?variability, suggesting right atrial pressure of 8 mmHg.  ?Past Medical History:  ?Diagnosis Date  ? Anal fissure   ? Anal fistula   ? Arthritis   ? Atrial fibrillation ([place])   ? Blood transfusion without reported diagnosis   ? Cardiomyopathy   ? Nonishemic, possibly tachycardia-mediated. 1997 LHC with no significant CAD. Myoview (7/09) showed EF 44% with possible mild inferior ischemia. Echo (9/10): EF 60%  ? Cataract   ? CHF (congestive heart failure) ([place])   ? Chronic atrial fibrillation (HCC)   ? s/p AV nodal ablation and on Coumadin. Patient developed dyspnea/fatigue with RV  pacing and an LV lead was placed  ? Colon polyp   ? hyperplastic and adenomatous  ? GERD (gastroesophageal  reflux disease)   ? HTN (hypertension)   ? Hyperlipidemia   ? Hyperplastic colon polyp   ? and adenomatous  ? Osteoarthritis of right shoulder region   ? Pacemaker   ? St. Jude CRT-P device  ? Pseudogout   ? Sleep apnea   ? Upper GI bleed   ? With duodenal ulcer in 2005.  Patient was on ASA and coumadin at that time. ASA was stopped..  ? ? ?Past Surgical History:  ?Procedure Laterality Date  ? [place] N/A 01/28/2017  ? Procedure: BiV Pacemaker Generator Changeout;  Surgeon: [name], MD;  Location: [facility];  Service: Cardiovascular;  Laterality: N/A;  ? PACEMAKER INSERTION    ? St. Jude's  ? SHOULDER SURGERY    ? Right  ? TREATMENT FISTULA ANAL    ? WISDOM TOOTH EXTRACTION    ? ? ?MEDICATIONS: ? carvedilol (COREG) 25 MG tablet  ? colchicine 0.6 MG tablet  ? ELIQUIS 5 MG TABS tablet  ? furosemide (LASIX) 40 MG tablet  ? hydrochlorothiazide 25 MG tablet  ? Multiple Vitamin (MULTIVITAMIN WITH MINERALS) TABS tablet  ? potassium chloride SA (K-DUR,KLOR-CON) 20 MEQ tablet  ? ramipril (ALTACE) 10 MG capsule  ? rosuvastatin (CRESTOR) 20 MG tablet  ? ?No current facility-administered medications for this encounter.  ? ? ? ?Konrad Felix Ward, PA-C ?[facility] ?[phone] ? ? ? ? ? ?

## 2022-02-06 NOTE — Anesthesia Preprocedure Evaluation (Addendum)
Anesthesia Evaluation  ?Patient identified by MRN, date of birth, ID band ?Patient awake ? ? ? ?Reviewed: ?Allergy & Precautions, NPO status , Patient's Chart, lab work & pertinent test results, reviewed documented beta blocker date and time  ? ?Airway ?Mallampati: II ? ?TM Distance: >3 FB ?Neck ROM: Full ? ? ? Dental ? ?(+) Dental Advisory Given, Poor Dentition, Chipped,  ?  ?Pulmonary ?sleep apnea and Continuous Positive Airway Pressure Ventilation ,  ?  ?Pulmonary exam normal ?breath sounds clear to auscultation ? ? ? ? ? ? Cardiovascular ?hypertension, Pt. on home beta blockers and Pt. on medications ?+CHF  ?Normal cardiovascular exam+ dysrhythmias Atrial Fibrillation + pacemaker  ?Rhythm:Regular Rate:Normal ? ?Echo 04/30/21: ??1. Left ventricular ejection fraction, by estimation, is 50 to 55%. The  ?left ventricle has low normal function. There is mild left ventricular  ?hypertrophy. Left ventricular diastolic parameters are indeterminate.  ??2. Right ventricular systolic function is mildly reduced. The right  ?ventricular size is normal. There is normal pulmonary artery systolic  ?pressure. The estimated right ventricular systolic pressure is 10.9 mmHg.  ??3. Left atrial size was mildly dilated.  ??4. The mitral valve is normal in structure. No evidence of mitral valve  ?regurgitation.  ??5. The aortic valve is tricuspid. Aortic valve regurgitation is not  ?visualized. No aortic stenosis is present.  ??6. The inferior vena cava is dilated in size with >50% respiratory  ?variability, suggesting right atrial pressure of 8 mmHg.  ?  ?Neuro/Psych ?negative neurological ROS ?   ? GI/Hepatic ?Neg liver ROS, GERD  ,  ?Endo/Other  ?negative endocrine ROS ? Renal/GU ?negative Renal ROS  ? ?  ?Musculoskeletal ? ?(+) Arthritis ,  ? Abdominal ?  ?Peds ? Hematology ? ?(+) Blood dyscrasia (Eliquis), , Obesity ?   ?Anesthesia Other Findings ?Day of surgery medications reviewed with the  patient. ? Reproductive/Obstetrics ? ?  ? ? ? ? ? ? ? ? ? ? ? ? ? ?  ?  ? ? ? ? ? ? ? ?Anesthesia Physical ?Anesthesia Plan ? ?ASA: 3 ? ?Anesthesia Plan: General  ? ?Post-op Pain Management: Tylenol PO (pre-op)*  ? ?Induction: Intravenous ? ?PONV Risk Score and Plan: 2 and Dexamethasone and Ondansetron ? ?Airway Management Planned: LMA ? ?Additional Equipment:  ? ?Intra-op Plan:  ? ?Post-operative Plan: Extubation in OR ? ?Informed Consent: I have reviewed the patients History and Physical, chart, labs and discussed the procedure including the risks, benefits and alternatives for the proposed anesthesia with the patient or authorized representative who has indicated his/her understanding and acceptance.  ? ? ? ?Dental advisory given ? ?Plan Discussed with: CRNA ? ?Anesthesia Plan Comments: (See PAT note 02/06/2022, Konrad Felix Ward, PA-C)  ? ? ? ? ? ?Anesthesia Quick Evaluation ? ?

## 2022-02-07 ENCOUNTER — Encounter (HOSPITAL_COMMUNITY)
Admission: RE | Disposition: A | Discharge status: Home / Self Care | Payer: Self-pay | Source: Ambulatory Visit | Attending: Urology

## 2022-02-07 ENCOUNTER — Ambulatory Visit (HOSPITAL_BASED_OUTPATIENT_CLINIC_OR_DEPARTMENT_OTHER): Payer: PPO | Admitting: Anesthesiology

## 2022-02-07 ENCOUNTER — Ambulatory Visit (HOSPITAL_COMMUNITY)
Admission: RE | Admit: 2022-02-07 | Discharge: 2022-02-07 | Disposition: A | Discharge status: Home / Self Care | Payer: PPO | Source: Ambulatory Visit | Attending: Urology | Admitting: Urology

## 2022-02-07 ENCOUNTER — Encounter (HOSPITAL_COMMUNITY): Payer: Self-pay | Admitting: Urology

## 2022-02-07 ENCOUNTER — Other Ambulatory Visit: Payer: Self-pay

## 2022-02-07 ENCOUNTER — Ambulatory Visit (HOSPITAL_COMMUNITY): Payer: PPO | Admitting: Physician Assistant

## 2022-02-07 DIAGNOSIS — E669 Obesity, unspecified: Secondary | ICD-10-CM | POA: Insufficient documentation

## 2022-02-07 DIAGNOSIS — G4733 Obstructive sleep apnea (adult) (pediatric): Secondary | ICD-10-CM | POA: Diagnosis not present

## 2022-02-07 DIAGNOSIS — Z79899 Other long term (current) drug therapy: Secondary | ICD-10-CM | POA: Insufficient documentation

## 2022-02-07 DIAGNOSIS — Z6832 Body mass index (BMI) 32.0-32.9, adult: Secondary | ICD-10-CM | POA: Insufficient documentation

## 2022-02-07 DIAGNOSIS — Z7901 Long term (current) use of anticoagulants: Secondary | ICD-10-CM | POA: Diagnosis not present

## 2022-02-07 DIAGNOSIS — I509 Heart failure, unspecified: Secondary | ICD-10-CM | POA: Insufficient documentation

## 2022-02-07 DIAGNOSIS — Z950 Presence of cardiac pacemaker: Secondary | ICD-10-CM | POA: Diagnosis not present

## 2022-02-07 DIAGNOSIS — G4730 Sleep apnea, unspecified: Secondary | ICD-10-CM | POA: Insufficient documentation

## 2022-02-07 DIAGNOSIS — I2510 Atherosclerotic heart disease of native coronary artery without angina pectoris: Secondary | ICD-10-CM

## 2022-02-07 DIAGNOSIS — N471 Phimosis: Secondary | ICD-10-CM

## 2022-02-07 DIAGNOSIS — M1990 Unspecified osteoarthritis, unspecified site: Secondary | ICD-10-CM | POA: Insufficient documentation

## 2022-02-07 DIAGNOSIS — Z01818 Encounter for other preprocedural examination: Secondary | ICD-10-CM

## 2022-02-07 DIAGNOSIS — I482 Chronic atrial fibrillation, unspecified: Secondary | ICD-10-CM | POA: Insufficient documentation

## 2022-02-07 DIAGNOSIS — I4891 Unspecified atrial fibrillation: Secondary | ICD-10-CM | POA: Diagnosis not present

## 2022-02-07 DIAGNOSIS — Z8249 Family history of ischemic heart disease and other diseases of the circulatory system: Secondary | ICD-10-CM | POA: Diagnosis not present

## 2022-02-07 DIAGNOSIS — I110 Hypertensive heart disease with heart failure: Secondary | ICD-10-CM

## 2022-02-07 HISTORY — PX: CIRCUMCISION: SHX1350

## 2022-02-07 SURGERY — CIRCUMCISION, ADULT
Anesthesia: General | Site: Penis

## 2022-02-07 MED ORDER — FENTANYL CITRATE (PF) 100 MCG/2ML IJ SOLN
INTRAMUSCULAR | Status: AC
  Filled 2022-02-07: qty 2

## 2022-02-07 MED ORDER — OXYCODONE HCL 5 MG PO TABS
ORAL_TABLET | ORAL | Status: AC
  Filled 2022-02-07: qty 1

## 2022-02-07 MED ORDER — BACITRACIN ZINC 500 UNIT/GM EX OINT
TOPICAL_OINTMENT | CUTANEOUS | Status: DC | PRN
  Administered 2022-02-07: 1 via TOPICAL

## 2022-02-07 MED ORDER — LIDOCAINE 2% (20 MG/ML) 5 ML SYRINGE
INTRAMUSCULAR | Status: DC | PRN
  Administered 2022-02-07: 100 mg via INTRAVENOUS

## 2022-02-07 MED ORDER — BUPIVACAINE HCL (PF) 0.25 % IJ SOLN
INTRAMUSCULAR | Status: DC | PRN
Start: 2022-02-07 — End: 2022-02-07
  Administered 2022-02-07: 17 mL

## 2022-02-07 MED ORDER — ACETAMINOPHEN 500 MG PO TABS
1000.0000 mg | ORAL_TABLET | Freq: Once | ORAL | Status: AC
  Administered 2022-02-07: 1000 mg via ORAL
  Filled 2022-02-07: qty 2

## 2022-02-07 MED ORDER — OXYCODONE HCL 5 MG PO TABS
5.0000 mg | ORAL_TABLET | Freq: Once | ORAL | Status: AC
  Administered 2022-02-07: 5 mg via ORAL

## 2022-02-07 MED ORDER — 0.9 % SODIUM CHLORIDE (POUR BTL) OPTIME
TOPICAL | Status: DC | PRN
  Administered 2022-02-07: 1000 mL

## 2022-02-07 MED ORDER — ONDANSETRON HCL 4 MG/2ML IJ SOLN
INTRAMUSCULAR | Status: DC | PRN
  Administered 2022-02-07: 4 mg via INTRAVENOUS

## 2022-02-07 MED ORDER — OXYCODONE-ACETAMINOPHEN 5-325 MG PO TABS: 1.0000 | ORAL_TABLET | ORAL | 0 refills | Status: DC | PRN

## 2022-02-07 MED ORDER — ONDANSETRON HCL 4 MG/2ML IJ SOLN
INTRAMUSCULAR | Status: AC
  Filled 2022-02-07: qty 2

## 2022-02-07 MED ORDER — CEFAZOLIN SODIUM-DEXTROSE 2-4 GM/100ML-% IV SOLN
2.0000 g | Freq: Once | INTRAVENOUS | Status: AC
  Administered 2022-02-07: 2 g via INTRAVENOUS
  Filled 2022-02-07: qty 100

## 2022-02-07 MED ORDER — CHLORHEXIDINE GLUCONATE 0.12 % MT SOLN
15.0000 mL | Freq: Once | OROMUCOSAL | Status: AC
  Administered 2022-02-07: 15 mL via OROMUCOSAL

## 2022-02-07 MED ORDER — ORAL CARE MOUTH RINSE: 15.0000 mL | Freq: Once | OROMUCOSAL | Status: AC

## 2022-02-07 MED ORDER — ONDANSETRON HCL 4 MG/2ML IJ SOLN: 4.0000 mg | Freq: Once | INTRAMUSCULAR | Status: DC | PRN

## 2022-02-07 MED ORDER — DOCUSATE SODIUM 100 MG PO CAPS: 100.0000 mg | ORAL_CAPSULE | Freq: Every day | ORAL | 0 refills | Status: DC | PRN

## 2022-02-07 MED ORDER — FENTANYL CITRATE PF 50 MCG/ML IJ SOSY: 25.0000 ug | PREFILLED_SYRINGE | INTRAMUSCULAR | Status: DC | PRN

## 2022-02-07 MED ORDER — DEXAMETHASONE SODIUM PHOSPHATE 10 MG/ML IJ SOLN
INTRAMUSCULAR | Status: DC | PRN
  Administered 2022-02-07: 10 mg via INTRAVENOUS

## 2022-02-07 MED ORDER — BACITRACIN ZINC 500 UNIT/GM EX OINT
TOPICAL_OINTMENT | CUTANEOUS | Status: AC
  Filled 2022-02-07: qty 28.35

## 2022-02-07 MED ORDER — FENTANYL CITRATE (PF) 100 MCG/2ML IJ SOLN
INTRAMUSCULAR | Status: DC | PRN
  Administered 2022-02-07 (×2): 25 ug via INTRAVENOUS
  Administered 2022-02-07: 50 ug via INTRAVENOUS

## 2022-02-07 MED ORDER — BUPIVACAINE HCL (PF) 0.25 % IJ SOLN
INTRAMUSCULAR | Status: AC
  Filled 2022-02-07: qty 30

## 2022-02-07 MED ORDER — PROPOFOL 10 MG/ML IV BOLUS
INTRAVENOUS | Status: AC
  Filled 2022-02-07: qty 20

## 2022-02-07 MED ORDER — PROPOFOL 10 MG/ML IV BOLUS
INTRAVENOUS | Status: DC | PRN
  Administered 2022-02-07: 160 mg via INTRAVENOUS

## 2022-02-07 MED ORDER — LACTATED RINGERS IV SOLN: INTRAVENOUS | Status: DC

## 2022-02-07 MED ORDER — DEXAMETHASONE SODIUM PHOSPHATE 10 MG/ML IJ SOLN
INTRAMUSCULAR | Status: AC
  Filled 2022-02-07: qty 1

## 2022-02-07 SURGICAL SUPPLY — 34 items
BAG COUNTER SPONGE SURGICOUNT (BAG) IMPLANT
BAG SPNG CNTER NS LX DISP (BAG)
BLADE SURG 15 STRL LF DISP TIS (BLADE) ×1 IMPLANT
BLADE SURG 15 STRL SS (BLADE) ×2
BNDG COHESIVE 1X5 TAN STRL LF (GAUZE/BANDAGES/DRESSINGS) ×1 IMPLANT
BNDG CONFORM 2 STRL LF (GAUZE/BANDAGES/DRESSINGS) ×2 IMPLANT
DRAPE LAPAROTOMY T 102X78X121 (DRAPES) ×2 IMPLANT
ELECT NDL TIP 2.8 STRL (NEEDLE) ×1 IMPLANT
ELECT NEEDLE TIP 2.8 STRL (NEEDLE) IMPLANT
ELECT REM PT RETURN 15FT ADLT (MISCELLANEOUS) ×2 IMPLANT
GAUZE 4X4 16PLY ~~LOC~~+RFID DBL (SPONGE) ×3 IMPLANT
GAUZE SPONGE 4X4 12PLY STRL (GAUZE/BANDAGES/DRESSINGS) ×2 IMPLANT
GAUZE XEROFORM 1X8 LF (GAUZE/BANDAGES/DRESSINGS) ×1 IMPLANT
GLOVE BIOGEL PI IND STRL 6.5 (GLOVE) IMPLANT
GLOVE BIOGEL PI INDICATOR 6.5 (GLOVE) ×2
GLOVE SURG LX 7.5 STRW (GLOVE) ×1
GLOVE SURG LX STRL 7.5 STRW (GLOVE) ×1 IMPLANT
GOWN STRL REUS W/TWL LRG LVL3 (GOWN DISPOSABLE) ×2 IMPLANT
KIT BASIN OR (CUSTOM PROCEDURE TRAY) ×2 IMPLANT
KIT TURNOVER KIT A (KITS) ×1 IMPLANT
NDL HYPO 25X1 1.5 SAFETY (NEEDLE) IMPLANT
NEEDLE HYPO 25X1 1.5 SAFETY (NEEDLE) ×2 IMPLANT
NS IRRIG 1000ML POUR BTL (IV SOLUTION) IMPLANT
PACK BASIC VI WITH GOWN DISP (CUSTOM PROCEDURE TRAY) ×2 IMPLANT
PENCIL SMOKE EVACUATOR (MISCELLANEOUS) IMPLANT
SPIKE FLUID TRANSFER (MISCELLANEOUS) IMPLANT
SUT CHROMIC 4 0 P 3 18 (SUTURE) ×2 IMPLANT
SUT CHROMIC 4 0 PS 2 18 (SUTURE) ×2 IMPLANT
SUT SILK 3 0 SH 30 (SUTURE) ×1 IMPLANT
SUT VIC AB 4-0 RB1 27 (SUTURE) ×4
SUT VIC AB 4-0 RB1 27XBRD (SUTURE) IMPLANT
SYR CONTROL 10ML LL (SYRINGE) ×1 IMPLANT
TOWEL OR 17X26 10 PK STRL BLUE (TOWEL DISPOSABLE) ×2 IMPLANT
WATER STERILE IRR 1000ML POUR (IV SOLUTION) IMPLANT

## 2022-02-07 NOTE — Discharge Instructions (Signed)

## 2022-02-07 NOTE — H&P (Signed)
CC/HPI: [name] Page is a 76 year old male seen in consultation today for phimosis.  ? ?1. Phimosis:  ?-He states that he is uncircumcised and has been unable to retract his foreskin over the glans for the past 10 years. He denies recurrent urinary tract infections or sexually-transmitted infections. He denies any evidence of masses or drainage. He states he previously consulted with urologist a few years ago who recommended circumcision however he did not go through with this procedure. He is now interested in circumcision. He is wondering why he was not advised to undergo circumcision earlier in his life. He has no difficulty voiding. He does state that sometimes his urine will drain down his leg as he is unable to visualize his glans and meatus.  ? ?Patient currently denies fever, chills, sweats, nausea, vomiting, abdominal or flank pain, gross hematuria or dysuria.  ? ?He does have a cardiac history and is on Eliquis.  ? ?  ?ALLERGIES: No Known Drug Allergies ?  ? ?MEDICATIONS: Hydrochlorothiazide 25 mg tablet  ?Carvedilol 25 mg tablet  ?Eliquis 5 mg tablet  ?Furosemide  ?Ramipril 10 mg capsule  ?Rosuvastatin Calcium 20 mg tablet  ?  ? ?GU PSH: None  ? ?NON-GU PSH: Pacemaker placement - 2000 ? ?  ? ?GU PMH: None  ? ?NON-GU PMH: Arthritis ?Atrial Fibrillation ?Gout ?Heart disease, unspecified ?Hypercholesterolemia ?Hypertension ?Other heart failure ?Sleep Apnea ?  ? ?FAMILY HISTORY: Heart problem - Runs in Family ?Kidney Stones - Runs in Family  ? ?SOCIAL HISTORY: Marital Status: Married ?Ethnicity: Not Hispanic Or Latino; Race: White ?Current Smoking Status: Patient has never smoked.  ? ?Tobacco Use Assessment Completed: Used Tobacco in last 30 days? ?Has never drank.  ?Drinks 4+ caffeinated drinks per day. ?  ? ?REVIEW OF SYSTEMS:    ?GU Review Male:   Patient reports hard to postpone urination. Patient denies frequent urination, burning/ pain with urination, get up at night to urinate, leakage of urine, stream  starts and stops, trouble starting your stream, have to strain to urinate , erection problems, and penile pain.  ?Gastrointestinal (Upper):   Patient denies nausea, vomiting, and indigestion/ heartburn.  ?Gastrointestinal (Lower):   Patient denies diarrhea and constipation.  ?Constitutional:   Patient denies fever, night sweats, weight loss, and fatigue.  ?Skin:   Patient denies skin rash/ lesion and itching.  ?Eyes:   Patient denies blurred vision and double vision.  ?Ears/ Nose/ Throat:   Patient denies sore throat and sinus problems.  ?Hematologic/Lymphatic:   Patient denies swollen glands and easy bruising.  ?Cardiovascular:   Patient denies leg swelling and chest pains.  ?Respiratory:   Patient denies shortness of breath and cough.  ?Endocrine:   Patient denies excessive thirst.  ?Musculoskeletal:   Patient reports back pain and joint pain.   ?Neurological:   Patient denies headaches and dizziness.  ?Psychologic:   Patient denies depression and anxiety.  ? ?VITAL SIGNS:    ?  01/07/2022 09:10 AM  ?Weight 250 lb / 113.4 kg  ?Height 73 in / 185.42 cm  ?BP 166/107 mmHg  ?Pulse 116 /min  ?Temperature 97.1 F / 36.1 C  ?BMI 33.0 kg/m?  ? ?GU PHYSICAL EXAMINATION:    ?Scrotum: No lesions. No edema. No cysts. No warts.  ?Epididymides: Right: no spermatocele, no masses, no cysts, no tenderness, no induration, no enlargement. Left: no spermatocele, no masses, no cysts, no tenderness, no induration, no enlargement.  ?Testes: No tenderness, no swelling, no enlargement left testes. No tenderness, no swelling,  no enlargement right testes. Normal location left testes. Normal location right testes. No mass, no cyst, no varicocele, no hydrocele left testes. No mass, no cyst, no varicocele, no hydrocele right testes.  ?Urethral Meatus: Normal size. No lesion, no wart, no discharge, no polyp. Normal location.  ?Penis: Uncircumcised. Unable to retract the foreskin over the glans. No masses identified on the exposed foreskin.   ? ?MULTI-SYSTEM PHYSICAL EXAMINATION:    ?Constitutional: Well-nourished. No physical deformities. Normally developed. Good grooming.  ?Respiratory: No labored breathing, no use of accessory muscles.   ?Cardiovascular: Normal temperature, normal extremity pulses, no swelling, no varicosities.  ?Gastrointestinal: No mass, no tenderness, no rigidity, non obese abdomen.  ? ?  ?Complexity of Data:  ?Source Of History:  Patient, Medical Record Summary  ?Records Review:   Previous Doctor Records  ?Urine Test Review:   Urinalysis  ? ?PROCEDURES:    ? ?     Urinalysis ?Dipstick Dipstick Cont'd  ?Color: Yellow Bilirubin: Neg mg/dL  ?Appearance: Clear Ketones: Neg mg/dL  ?Specific Gravity: 1.020 Blood: Neg ery/uL  ?pH: 6.0 Protein: Trace mg/dL  ?Glucose: Neg mg/dL Urobilinogen: 0.2 mg/dL  ?  Nitrites: Neg  ?  Leukocyte Esterase: Neg leu/uL  ? ? ?ASSESSMENT:  ?    ICD-10 Details  ?1 GU:   Phimosis - N47.1   ? ?PLAN:    ? ?      Document ?Letter(s):  Created for Patient: Clinical Summary  ? ? ?     Notes:   #1. Phimosis:  ?-We discussed options and he would like to proceed with circumcision. Why discussed risk and benefits. Surgery letter sent.  ? ?CC: [name], MD  ? ?Urology Preoperative H&P  ? ?Chief Complaint: Phimosis ? ?History of Present Illness: [name] Page is a 76 y.o. male with phimosis here for circumcision. ? ? ? ?Past Medical History:  ?Diagnosis Date  ? Anal fissure   ? Anal fistula   ? Arthritis   ? Atrial fibrillation ([place])   ? Blood transfusion without reported diagnosis   ? Cardiomyopathy   ? Nonishemic, possibly tachycardia-mediated. 1997 LHC with no significant CAD. Myoview (7/09) showed EF 44% with possible mild inferior ischemia. Echo (9/10): EF 60%  ? Cataract   ? CHF (congestive heart failure) ([place])   ? Chronic atrial fibrillation (HCC)   ? s/p AV nodal ablation and on Coumadin. Patient developed dyspnea/fatigue with RV  pacing and an LV lead was placed  ? Colon polyp   ? hyperplastic and  adenomatous  ? GERD (gastroesophageal reflux disease)   ? HTN (hypertension)   ? Hyperlipidemia   ? Hyperplastic colon polyp   ? and adenomatous  ? Osteoarthritis of right shoulder region   ? Pacemaker   ? St. Jude CRT-P device  ? Pseudogout   ? Sleep apnea   ? Upper GI bleed   ? With duodenal ulcer in 2005.  Patient was on ASA and coumadin at that time. ASA was stopped..  ? ? ?Past Surgical History:  ?Procedure Laterality Date  ? [place] N/A 01/28/2017  ? Procedure: BiV Pacemaker Generator Changeout;  Surgeon: [name], MD;  Location: [facility];  Service: Cardiovascular;  Laterality: N/A;  ? PACEMAKER INSERTION    ? St. Jude's  ? SHOULDER SURGERY    ? Right  ? TREATMENT FISTULA ANAL    ? WISDOM TOOTH EXTRACTION    ? ? ?Allergies: No Known Allergies ? ?Family History  ?Problem  Relation Age of Onset  ? Heart disease Mother   ? Stroke Father   ? Diverticulitis Sister   ? Diabetes Sister   ? Diabetes Sister   ? Colon cancer Neg Hx   ? ? ?Social History:  reports that he has never smoked. He has never used smokeless tobacco. He reports current alcohol use. He reports that he does not use drugs. ? ?ROS: ?A complete review of systems was performed.  All systems are negative except for pertinent findings as noted. ? ?Physical Exam:  ?Vital signs in last 24 hours: ?Temp:  [99.2 ?F (37.3 ?C)] 99.2 ?F (37.3 ?C) (04/28 1107) ?Pulse Rate:  [70] 70 (04/28 1107) ?Resp:  [18] 18 (04/28 1107) ?BP: (143)/(80) 143/80 (04/28 1107) ?SpO2:  [100 %] 100 % (04/28 1107) ?Weight:  [113.4 kg] 113.4 kg (04/28 1106) ?Constitutional:  Alert and oriented, No acute distress ?Cardiovascular: Regular rate and rhythm ?Respiratory: Normal respiratory effort, Lungs clear bilaterally ?GI: Abdomen is soft, nontender, nondistended, no abdominal masses ?GU: No CVA tenderness ?Lymphatic: No lymphadenopathy ?Neurologic: Grossly intact, no focal deficits ?Psychiatric: Normal mood and affect ? ?Laboratory Data:   ?Recent Labs  ?  02/06/22 ?0916  ?WBC 8.5  ?HGB 15.0  ?HCT 43.1  ?PLT 216  ? ? ?Recent Labs  ?  02/06/22 ?0916  ?NA 137  ?K 3.9  ?CL 107  ?GLUCOSE 97  ?BUN 19  ?CALCIUM 9.3  ?CREATININE 0.67  ? ? ? ?No results foun

## 2022-02-07 NOTE — Anesthesia Postprocedure Evaluation (Signed)
Anesthesia Post Note ? ?Patient: ANTWAN PANDYA ? ?Procedure(s) Performed: CIRCUMCISION ADULT (Penis) ? ?  ? ?Patient location during evaluation: PACU ?Anesthesia Type: General ?Level of consciousness: awake and alert ?Pain management: pain level controlled ?Vital Signs Assessment: post-procedure vital signs reviewed and stable ?Respiratory status: spontaneous breathing, nonlabored ventilation, respiratory function stable and patient connected to nasal cannula oxygen ?Cardiovascular status: blood pressure returned to baseline and stable ?Postop Assessment: no apparent nausea or vomiting ?Anesthetic complications: no ? ? ?No notable events documented. ? ?Last Vitals:  ?Vitals:  ? 02/07/22 1500 02/07/22 1504  ?BP: (!) 156/88 (!) 154/86  ?Pulse: 72 70  ?Resp: 15 19  ?Temp:    ?SpO2: 99% 100%  ?  ?Last Pain:  ?Vitals:  ? 02/07/22 1535  ?TempSrc:   ?PainSc: 0-No pain  ? ? ?  ?  ?  ?  ?  ?  ? ?[place] ? ? ? ? ?

## 2022-02-07 NOTE — Op Note (Signed)
Operative Note ? ?Preoperative diagnosis:  ?1.  Phimosis ? ?Postoperative diagnosis: ?1.  Phimosis ? ?Procedure(s): ?1.  Circumcision with frenuloplasty ?2. Dorsal penile nerve block ? ?Surgeon: Rexene Alberts, MD ? ?Assistants:  None ? ?Anesthesia:  General ? ?Complications:  None ? ?EBL:  <58m ? ?Specimens: ?1. None ? ?Drains/Catheters: ?1.  none ? ?Intraoperative findings:   ?Phimosis ?Dense penile adhesions ?Uncomplicated circumcision ? ?Indication:  [name] PAARis a 76y.o. male who presented with phimosis and after being counseled with options he elected to have circumcision.  ? ?Description of procedure: ?The patient was brought back to the operating room, placed on table in a supine position.  He had bilateral sequential compression devices placed and received a dose of Ancef 2 gr preoperatively for antibiotic prophylaxis.  He was placed under general LM anesthesia and then standard prep was performed.  He was prepped and draped in standard sterile fashion.  Surgical time-out was performed identifying the correct patient, procedure, and all were in agreement.  ? ?A dorsal penile nerve block was given with .25% Marcaine plain, a total of 230mused. A dorsal slit was made. I encountered dense penile adhesions, these were bluntly released. Betadine was used to prep the glans. The prepuce was retracted and a subcoronal incision made at 1 cm from sulcus. Hemostasis was obtained with electrocautery. The frenulum was reapproximated with a running and interrupted 4-0 vicryl. Hemostats were used to provide traction on the redundant prepuce and a dorsal slit was made with mets the incise the appropriate length of skin for removal. An anchoring interrupted 4-0 vicryl was placed reapproximate the skin, leaving the tail long to provide traction. Scissors were used to make a ventral slit and another vicryl was placed at this location. An incision was made on the skin between these two vicryls. The tissue was then removed  with cautery to control any blood vessels at the dartos fascia. Procedure was repeated on the other side, incising the skin and removing with cautery. Hemostasis was intact. Interrupted 4-0 vicryls were placed in interrupted fashion. Additional vicryls were placed at the frenulum for hemostasis. Satisfied with the cosmesis, we finalized the case with the dressing; Xerform, covered with loose cling and coban wrap. ? ?Plan:  The patient was awoken from general anesthesia, transferred to the PACU in stable condition.  He will be recovered in the PACU and then discharged. ? ?Matt R. Nansi Birmingham MD ?[facility]  ?Pager: [phone] ?

## 2022-02-07 NOTE — Anesthesia Procedure Notes (Signed)
Procedure Name: LMA Insertion ?Date/Time: 02/07/2022 1:10 PM ?Performed by: Milford Cage, CRNA ?Pre-anesthesia Checklist: Patient identified, Emergency Drugs available, Suction available and Patient being monitored ?Patient Re-evaluated:Patient Re-evaluated prior to induction ?Oxygen Delivery Method: Circle system utilized ?Preoxygenation: Pre-oxygenation with 100% oxygen ?Induction Type: IV induction ?Ventilation: Mask ventilation without difficulty ?LMA: LMA inserted ?LMA Size: 5.0 ?Number of attempts: 1 ?Tube secured with: Tape ?Dental Injury: Teeth and Oropharynx as per pre-operative assessment  ? ? ? ? ?

## 2022-02-07 NOTE — Transfer of Care (Signed)
Immediate Anesthesia Transfer of Care Note ? ?Patient: [name] Page ? ?Procedure(s) Performed: CIRCUMCISION ADULT (Penis) ? ?Patient Location: PACU ? ?Anesthesia Type:General ? ?Level of Consciousness: drowsy ? ?Airway & Oxygen Therapy: Patient Spontanous Breathing and Patient connected to face mask oxygen ? ?Post-op Assessment: Report given to RN and Post -op Vital signs reviewed and stable ? ?Post vital signs: Reviewed and stable ? ?Last Vitals:  ?Vitals Value Taken Time  ?BP 145/91 02/07/22 1418  ?Temp    ?Pulse 73 02/07/22 1420  ?Resp 14 02/07/22 1420  ?SpO2 100 % 02/07/22 1420  ?Vitals shown include unvalidated device data. ? ?Last Pain:  ?Vitals:  ? 02/07/22 1107  ?TempSrc: Oral  ?PainSc:   ?   ? ?Patients Stated Pain Goal: 3 (02/07/22 1106) ? ?Complications: No notable events documented. ?

## 2022-02-08 ENCOUNTER — Encounter (HOSPITAL_COMMUNITY): Payer: Self-pay | Admitting: Urology

## 2022-02-16 DIAGNOSIS — G4733 Obstructive sleep apnea (adult) (pediatric): Secondary | ICD-10-CM | POA: Diagnosis not present

## 2022-02-17 DIAGNOSIS — G4733 Obstructive sleep apnea (adult) (pediatric): Secondary | ICD-10-CM | POA: Diagnosis not present

## 2022-02-17 NOTE — Progress Notes (Signed)
Remote pacemaker transmission.   

## 2022-02-21 DIAGNOSIS — N471 Phimosis: Secondary | ICD-10-CM | POA: Diagnosis not present

## 2022-03-19 DIAGNOSIS — G4733 Obstructive sleep apnea (adult) (pediatric): Secondary | ICD-10-CM | POA: Diagnosis not present

## 2022-04-08 ENCOUNTER — Encounter: Payer: Self-pay | Admitting: Internal Medicine

## 2022-04-08 ENCOUNTER — Ambulatory Visit: Payer: PPO | Admitting: Internal Medicine

## 2022-04-08 VITALS — BP 134/74 | HR 83 | Ht 74.0 in | Wt 256.6 lb

## 2022-04-08 DIAGNOSIS — I4821 Permanent atrial fibrillation: Secondary | ICD-10-CM | POA: Diagnosis not present

## 2022-04-08 DIAGNOSIS — I442 Atrioventricular block, complete: Secondary | ICD-10-CM | POA: Diagnosis not present

## 2022-04-08 DIAGNOSIS — Z950 Presence of cardiac pacemaker: Secondary | ICD-10-CM

## 2022-04-08 DIAGNOSIS — I5022 Chronic systolic (congestive) heart failure: Secondary | ICD-10-CM

## 2022-04-08 MED ORDER — EMPAGLIFLOZIN 10 MG PO TABS: 10.0000 mg | ORAL_TABLET | Freq: Every day | ORAL | 3 refills | Status: DC

## 2022-04-18 DIAGNOSIS — G4733 Obstructive sleep apnea (adult) (pediatric): Secondary | ICD-10-CM | POA: Diagnosis not present

## 2022-05-01 ENCOUNTER — Ambulatory Visit (INDEPENDENT_AMBULATORY_CARE_PROVIDER_SITE_OTHER): Payer: PPO

## 2022-05-01 DIAGNOSIS — I442 Atrioventricular block, complete: Secondary | ICD-10-CM

## 2022-05-01 LAB — CUP PACEART REMOTE DEVICE CHECK
Battery Remaining Longevity: 34 mo
Battery Remaining Percentage: 38 %
Battery Voltage: 2.95 V
Date Time Interrogation Session: 20230720020012
Implantable Lead Implant Date: 20000217
Implantable Lead Implant Date: 20100414
Implantable Lead Location: 753858
Implantable Lead Location: 753860
Implantable Lead Model: 4196
Implantable Pulse Generator Implant Date: 20180418
Lead Channel Impedance Value: 1075 Ohm
Lead Channel Impedance Value: 410 Ohm
Lead Channel Pacing Threshold Amplitude: 0.75 V
Lead Channel Pacing Threshold Amplitude: 0.75 V
Lead Channel Pacing Threshold Pulse Width: 0.5 ms
Lead Channel Pacing Threshold Pulse Width: 0.8 ms
Lead Channel Sensing Intrinsic Amplitude: 12 mV
Lead Channel Setting Pacing Amplitude: 2 V
Lead Channel Setting Pacing Amplitude: 2.5 V
Lead Channel Setting Pacing Pulse Width: 0.5 ms
Lead Channel Setting Pacing Pulse Width: 0.8 ms
Lead Channel Setting Sensing Sensitivity: 4 mV
Pulse Gen Model: 3222
Pulse Gen Serial Number: 8013108

## 2022-05-04 ENCOUNTER — Encounter: Payer: Self-pay | Admitting: Internal Medicine

## 2022-05-19 DIAGNOSIS — G4733 Obstructive sleep apnea (adult) (pediatric): Secondary | ICD-10-CM | POA: Diagnosis not present

## 2022-05-19 NOTE — Progress Notes (Signed)
Remote pacemaker transmission.   

## 2022-05-21 DIAGNOSIS — G4733 Obstructive sleep apnea (adult) (pediatric): Secondary | ICD-10-CM | POA: Diagnosis not present

## 2022-06-05 DIAGNOSIS — N471 Phimosis: Secondary | ICD-10-CM | POA: Diagnosis not present

## 2022-06-19 DIAGNOSIS — G4733 Obstructive sleep apnea (adult) (pediatric): Secondary | ICD-10-CM | POA: Diagnosis not present

## 2022-07-19 DIAGNOSIS — G4733 Obstructive sleep apnea (adult) (pediatric): Secondary | ICD-10-CM | POA: Diagnosis not present

## 2022-07-21 ENCOUNTER — Encounter: Payer: Self-pay | Admitting: Internal Medicine

## 2022-07-21 DIAGNOSIS — I10 Essential (primary) hypertension: Secondary | ICD-10-CM | POA: Diagnosis not present

## 2022-07-21 DIAGNOSIS — R7989 Other specified abnormal findings of blood chemistry: Secondary | ICD-10-CM | POA: Diagnosis not present

## 2022-07-21 DIAGNOSIS — Z1212 Encounter for screening for malignant neoplasm of rectum: Secondary | ICD-10-CM | POA: Diagnosis not present

## 2022-07-21 DIAGNOSIS — E785 Hyperlipidemia, unspecified: Secondary | ICD-10-CM | POA: Diagnosis not present

## 2022-07-21 DIAGNOSIS — Z125 Encounter for screening for malignant neoplasm of prostate: Secondary | ICD-10-CM | POA: Diagnosis not present

## 2022-07-21 DIAGNOSIS — M109 Gout, unspecified: Secondary | ICD-10-CM | POA: Diagnosis not present

## 2022-07-28 DIAGNOSIS — Z1339 Encounter for screening examination for other mental health and behavioral disorders: Secondary | ICD-10-CM | POA: Diagnosis not present

## 2022-07-28 DIAGNOSIS — Z950 Presence of cardiac pacemaker: Secondary | ICD-10-CM | POA: Diagnosis not present

## 2022-07-28 DIAGNOSIS — Z0000 Encounter for general adult medical examination without abnormal findings: Secondary | ICD-10-CM | POA: Diagnosis not present

## 2022-07-28 DIAGNOSIS — Z1331 Encounter for screening for depression: Secondary | ICD-10-CM | POA: Diagnosis not present

## 2022-07-28 DIAGNOSIS — E669 Obesity, unspecified: Secondary | ICD-10-CM | POA: Diagnosis not present

## 2022-07-28 DIAGNOSIS — R82998 Other abnormal findings in urine: Secondary | ICD-10-CM | POA: Diagnosis not present

## 2022-07-28 DIAGNOSIS — M109 Gout, unspecified: Secondary | ICD-10-CM | POA: Diagnosis not present

## 2022-07-28 DIAGNOSIS — I110 Hypertensive heart disease with heart failure: Secondary | ICD-10-CM | POA: Diagnosis not present

## 2022-07-28 DIAGNOSIS — I10 Essential (primary) hypertension: Secondary | ICD-10-CM | POA: Diagnosis not present

## 2022-07-28 DIAGNOSIS — I5022 Chronic systolic (congestive) heart failure: Secondary | ICD-10-CM | POA: Diagnosis not present

## 2022-07-28 DIAGNOSIS — E785 Hyperlipidemia, unspecified: Secondary | ICD-10-CM | POA: Diagnosis not present

## 2022-07-28 DIAGNOSIS — I442 Atrioventricular block, complete: Secondary | ICD-10-CM | POA: Diagnosis not present

## 2022-07-31 ENCOUNTER — Ambulatory Visit (INDEPENDENT_AMBULATORY_CARE_PROVIDER_SITE_OTHER): Payer: PPO

## 2022-07-31 DIAGNOSIS — I442 Atrioventricular block, complete: Secondary | ICD-10-CM | POA: Diagnosis not present

## 2022-07-31 LAB — CUP PACEART REMOTE DEVICE CHECK
Battery Remaining Longevity: 31 mo
Battery Remaining Longevity: 32 mo
Battery Remaining Percentage: 35 %
Battery Remaining Percentage: 35 %
Battery Voltage: 2.93 V
Battery Voltage: 2.93 V
Date Time Interrogation Session: 20231019020015
Date Time Interrogation Session: 20231019104604
Implantable Lead Implant Date: 20000217
Implantable Lead Implant Date: 20100414
Implantable Lead Implant Date: 20000217
Implantable Lead Implant Date: 20100414
Implantable Lead Location: 753858
Implantable Lead Location: 753860
Implantable Lead Location: 753858
Implantable Lead Location: 753860
Implantable Lead Model: 4196
Implantable Lead Model: 4196
Implantable Pulse Generator Implant Date: 20180418
Implantable Pulse Generator Implant Date: 20180418
Lead Channel Impedance Value: 1025 Ohm
Lead Channel Impedance Value: 410 Ohm
Lead Channel Impedance Value: 1025 Ohm
Lead Channel Impedance Value: 410 Ohm
Lead Channel Pacing Threshold Amplitude: 0.625 V
Lead Channel Pacing Threshold Amplitude: 0.75 V
Lead Channel Pacing Threshold Amplitude: 0.625 V
Lead Channel Pacing Threshold Amplitude: 0.75 V
Lead Channel Pacing Threshold Pulse Width: 0.5 ms
Lead Channel Pacing Threshold Pulse Width: 0.8 ms
Lead Channel Pacing Threshold Pulse Width: 0.5 ms
Lead Channel Pacing Threshold Pulse Width: 0.8 ms
Lead Channel Sensing Intrinsic Amplitude: 12 mV
Lead Channel Sensing Intrinsic Amplitude: 12 mV
Lead Channel Setting Pacing Amplitude: 2 V
Lead Channel Setting Pacing Amplitude: 2.5 V
Lead Channel Setting Pacing Amplitude: 2 V
Lead Channel Setting Pacing Amplitude: 2.5 V
Lead Channel Setting Pacing Pulse Width: 0.5 ms
Lead Channel Setting Pacing Pulse Width: 0.8 ms
Lead Channel Setting Pacing Pulse Width: 0.5 ms
Lead Channel Setting Pacing Pulse Width: 0.8 ms
Lead Channel Setting Sensing Sensitivity: 4 mV
Lead Channel Setting Sensing Sensitivity: 4 mV
Pulse Gen Model: 3222
Pulse Gen Model: 3222
Pulse Gen Serial Number: 8013108
Pulse Gen Serial Number: 8013108

## 2022-08-08 NOTE — Progress Notes (Signed)
Remote pacemaker transmission.   

## 2022-08-19 DIAGNOSIS — G4733 Obstructive sleep apnea (adult) (pediatric): Secondary | ICD-10-CM | POA: Diagnosis not present

## 2022-08-21 DIAGNOSIS — G4733 Obstructive sleep apnea (adult) (pediatric): Secondary | ICD-10-CM | POA: Diagnosis not present

## 2022-09-18 DIAGNOSIS — G4733 Obstructive sleep apnea (adult) (pediatric): Secondary | ICD-10-CM | POA: Diagnosis not present

## 2022-10-13 DIAGNOSIS — G4733 Obstructive sleep apnea (adult) (pediatric): Secondary | ICD-10-CM | POA: Diagnosis not present

## 2022-10-19 DIAGNOSIS — G4733 Obstructive sleep apnea (adult) (pediatric): Secondary | ICD-10-CM | POA: Diagnosis not present

## 2022-10-30 ENCOUNTER — Ambulatory Visit: Payer: PPO | Attending: Internal Medicine

## 2022-10-30 DIAGNOSIS — I442 Atrioventricular block, complete: Secondary | ICD-10-CM

## 2022-10-30 LAB — CUP PACEART REMOTE DEVICE CHECK
Battery Remaining Longevity: 28 mo
Battery Remaining Percentage: 31 %
Battery Voltage: 2.92 V
Date Time Interrogation Session: 20240118020014
Implantable Lead Connection Status: 753985
Implantable Lead Connection Status: 753985
Implantable Lead Implant Date: 20000217
Implantable Lead Implant Date: 20100414
Implantable Lead Location: 753858
Implantable Lead Location: 753860
Implantable Lead Model: 4196
Implantable Pulse Generator Implant Date: 20180418
Lead Channel Impedance Value: 1025 Ohm
Lead Channel Impedance Value: 410 Ohm
Lead Channel Pacing Threshold Amplitude: 0.625 V
Lead Channel Pacing Threshold Amplitude: 0.75 V
Lead Channel Pacing Threshold Pulse Width: 0.5 ms
Lead Channel Pacing Threshold Pulse Width: 0.8 ms
Lead Channel Sensing Intrinsic Amplitude: 12 mV
Lead Channel Setting Pacing Amplitude: 2 V
Lead Channel Setting Pacing Amplitude: 2.5 V
Lead Channel Setting Pacing Pulse Width: 0.5 ms
Lead Channel Setting Pacing Pulse Width: 0.8 ms
Lead Channel Setting Sensing Sensitivity: 4 mV
Pulse Gen Model: 3222
Pulse Gen Serial Number: 8013108

## 2022-11-06 NOTE — Progress Notes (Signed)
PATIENT: [name] Page DOB: 1946-07-10  REASON FOR VISIT: follow up HISTORY FROM: patient  Chief Complaint  Patient presents with   Follow-up    Patient in room 1, here for Cpap follow up. Reports no concerns.      HISTORY OF PRESENT ILLNESS:  11/10/22 ALL:  [name] Page returns for follow up for OSA on CPAP. He reports getting a new machine with [employer] about a year ago. Airsence 10. We have not seen him in follow up since. He reports doing well. No concerns with machine or supplies.     11/07/2021 ALL: [name] Page is a 77 y.o. male here today for follow up for OSA on CPAP.   He reports he is doing well, and that he has no concerns at this time. His download for the last 30 days shows 100% compliance for both days used and for >4 hours. He is due for a new CPAP, we will order one for him today.     HISTORY: (copied from previous note) 11/01/20 SA: I reviewed his CPAP compliance data from 10/01/2020 through 10/30/2020, which is a total of 30 days, during which time he used his machine every night with percent use days greater than 4 hours at 100%, indicating superb compliance, average usage of 7 hours and 29 minutes, residual AHI at goal at 0.4/h, leak acceptable with a 95th percentile at 9.6 L/min on a pressure of 9 cm with EPR of 2.  He reports doing well.  He is up-to-date with his supplies, uses nasal pillows, gets his supplies quarterly through [place].  He has no new complaints.  He sees his electrophysiologist in cardiology yearly and his primary care twice yearly.      REVIEW OF SYSTEMS: Out of a complete 14 system review of symptoms, the patient complains only of the following symptoms, none and all other reviewed systems are negative.  ESS: 3/24  ALLERGIES: No Known Allergies  HOME MEDICATIONS: Outpatient Medications Prior to Visit  Medication Sig Dispense Refill   carvedilol (COREG) 25 MG tablet Take 25 mg by mouth 2 (two) times daily with a meal.     colchicine 0.6 MG  tablet Take 0.6 mg by mouth daily as needed (gout flare).      docusate sodium (COLACE) 100 MG capsule Take 1 capsule (100 mg total) by mouth daily as needed for up to 30 doses. 30 capsule 0   ELIQUIS 5 MG TABS tablet Take 5 mg by mouth 2 (two) times daily.     furosemide (LASIX) 40 MG tablet Take 40 mg by mouth daily as needed (swelling).     hydrochlorothiazide 25 MG tablet Take 25 mg by mouth daily.     Multiple Vitamin (MULTIVITAMIN WITH MINERALS) TABS tablet Take 1 tablet by mouth daily.     potassium chloride SA (K-DUR,KLOR-CON) 20 MEQ tablet Take 20 mEq by mouth daily as needed (When you take Lasix).     ramipril (ALTACE) 10 MG capsule Take 10 mg by mouth 2 (two) times daily.     rosuvastatin (CRESTOR) 20 MG tablet Take 20 mg by mouth daily.     empagliflozin (JARDIANCE) 10 MG TABS tablet Take 1 tablet (10 mg total) by mouth daily before breakfast. (Patient not taking: Reported on 11/10/2022) 90 tablet 3   No facility-administered medications prior to visit.    PAST MEDICAL HISTORY: Past Medical History:  Diagnosis Date   Anal fissure    Anal fistula    Arthritis  Atrial fibrillation ([place])    Blood transfusion without reported diagnosis    Cardiomyopathy    Nonishemic, possibly tachycardia-mediated. 1997 LHC with no significant CAD. Myoview (7/09) showed EF 44% with possible mild inferior ischemia. Echo (9/10): EF 60%   Cataract    CHF (congestive heart failure) (HCC)    Chronic atrial fibrillation (HCC)    s/p AV nodal ablation and on Coumadin. Patient developed dyspnea/fatigue with RV  pacing and an LV lead was placed   Colon polyp    hyperplastic and adenomatous   GERD (gastroesophageal reflux disease)    HTN (hypertension)    Hyperlipidemia    Hyperplastic colon polyp    and adenomatous   Osteoarthritis of right shoulder region    Pacemaker    St. Jude CRT-P device   Pseudogout    Sleep apnea    Upper GI bleed    With duodenal ulcer in 2005.  Patient was on ASA  and coumadin at that time. ASA was stopped.Marland Kitchen    PAST SURGICAL HISTORY: Past Surgical History:  Procedure Laterality Date   BIV PACEMAKER GENERATOR CHANGEOUT N/A 01/28/2017   Procedure: BiV Pacemaker Generator Changeout;  Surgeon: [name], MD;  Location: [facility];  Service: Cardiovascular;  Laterality: N/A;   CIRCUMCISION N/A 02/07/2022   Procedure: CIRCUMCISION ADULT;  Surgeon: Janith Lima, MD;  Location: [facility];  Service: Urology;  Laterality: N/A;   PACEMAKER INSERTION     St. Jude's   SHOULDER SURGERY     Right   TREATMENT FISTULA ANAL     WISDOM TOOTH EXTRACTION      FAMILY HISTORY: Family History  Problem Relation Age of Onset   Heart disease Mother    Stroke Father    Diverticulitis Sister    Diabetes Sister    Diabetes Sister    Colon cancer Neg Hx     SOCIAL HISTORY: Social History   Socioeconomic History   Marital status: Married    Spouse name: Di Kindle   Number of children: 0   Years of education: some coll.   Highest education level: Not on file  Occupational History   Occupation: Scientist, clinical (histocompatibility and immunogenetics): [employer]  Tobacco Use   Smoking status: Never   Smokeless tobacco: Never  Vaping Use   Vaping Use: Never used  Substance and Sexual Activity   Alcohol use: Yes    Alcohol/week: 0.0 standard drinks of alcohol    Comment: Very rare   Drug use: No   Sexual activity: Not on file  Other Topics Concern   Not on file  Social History Narrative   Lives in [place].   Does a lot of traveling.   Exercises daily.   Daily Caffeine use.   Patient is right handed   Social Determinants of Health   Financial Resource Strain: Not on file  Food Insecurity: Not on file  Transportation Needs: Not on file  Physical Activity: Not on file  Stress: Not on file  Social Connections: Not on file  Intimate Partner Violence: Not on file     PHYSICAL EXAM  Vitals:   11/10/22 1253  BP: 133/72  Pulse: 73  Weight: 260 lb 6.4 oz  (118.1 kg)  Height: '6\' 1"'$  (1.854 m)    Body mass index is 34.36 kg/m.  Generalized: Well developed, in no acute distress  Cardiology: normal rate and rhythm, no murmur noted Respiratory: clear to auscultation bilaterally  Neurological examination  Mentation: Alert oriented  to time, place, history taking. Follows all commands speech and language fluent Cranial nerve II-XII: Pupils were equal round reactive to light. Extraocular movements were full, visual field were full  Motor: The motor testing reveals 5 over 5 strength of all 4 extremities. Good symmetric motor tone is noted throughout.  Gait and station: Gait is normal.    DIAGNOSTIC DATA (LABS, IMAGING, TESTING) - I reviewed patient records, labs, notes, testing and imaging myself where available.      No data to display           Lab Results  Component Value Date   WBC 8.5 02/06/2022   HGB 15.0 02/06/2022   HCT 43.1 02/06/2022   MCV 93.3 02/06/2022   PLT 216 02/06/2022      Component Value Date/Time   NA 137 02/06/2022 0916   NA 139 03/28/2021 1458   K 3.9 02/06/2022 0916   CL 107 02/06/2022 0916   CO2 27 02/06/2022 0916   GLUCOSE 97 02/06/2022 0916   BUN 19 02/06/2022 0916   BUN 16 03/28/2021 1458   CREATININE 0.67 02/06/2022 0916   CALCIUM 9.3 02/06/2022 0916   GFRNONAA >60 02/06/2022 0916   GFRAA 105 01/21/2017 1100   No results found for: "CHOL", "HDL", "LDLCALC", "LDLDIRECT", "TRIG", "CHOLHDL" No results found for: "HGBA1C" No results found for: "VITAMINB12" No results found for: "TSH"   ASSESSMENT AND PLAN 77 y.o. year old male  has a past medical history of Anal fissure, Anal fistula, Arthritis, Atrial fibrillation ([place]), Blood transfusion without reported diagnosis, Cardiomyopathy, Cataract, CHF (congestive heart failure) ([place]), Chronic atrial fibrillation ([place]), Colon polyp, GERD (gastroesophageal reflux disease), HTN (hypertension), Hyperlipidemia, Hyperplastic colon polyp, Osteoarthritis of  right shoulder region, Pacemaker, Pseudogout, Sleep apnea, and Upper GI bleed. here with     ICD-10-CM   1. OSA on CPAP  G47.33 For home use only DME continuous positive airway pressure (CPAP)        [name] Page is doing well on CPAP therapy. Compliance report reveals excellent compliance. He was encouraged to continue using CPAP nightly and for greater than 4 hours each night. Risks of untreated sleep apnea review and education materials provided. Healthy lifestyle habits encouraged. He will follow up with me in 1 year. He verbalizes understanding and agreement with this plan.    Orders Placed This Encounter  Procedures   For home use only DME continuous positive airway pressure (CPAP)    Supplies    Order Specific Question:   Length of Need    Answer:   Lifetime    Order Specific Question:   Patient has OSA or probable OSA    Answer:   Yes    Order Specific Question:   Is the patient currently using CPAP in the home    Answer:   Yes    Order Specific Question:   Settings    Answer:   Other see comments    Order Specific Question:   CPAP supplies needed    Answer:   Mask, headgear, cushions, filters, heated tubing and water chamber     No orders of the defined types were placed in this encounter.   Debbora Presto, FNP-C 11/10/2022, 1:18 PM [facility] [address], [place], [place] [phone]

## 2022-11-06 NOTE — Patient Instructions (Addendum)

## 2022-11-10 ENCOUNTER — Ambulatory Visit (INDEPENDENT_AMBULATORY_CARE_PROVIDER_SITE_OTHER): Payer: PPO | Admitting: Family Medicine

## 2022-11-10 ENCOUNTER — Encounter: Payer: Self-pay | Admitting: Family Medicine

## 2022-11-10 VITALS — BP 133/72 | HR 73 | Ht 73.0 in | Wt 260.4 lb

## 2022-11-10 DIAGNOSIS — G4733 Obstructive sleep apnea (adult) (pediatric): Secondary | ICD-10-CM

## 2022-11-17 NOTE — Progress Notes (Signed)
Remote pacemaker transmission.   

## 2022-11-19 DIAGNOSIS — G4733 Obstructive sleep apnea (adult) (pediatric): Secondary | ICD-10-CM | POA: Diagnosis not present

## 2022-11-21 DIAGNOSIS — G4733 Obstructive sleep apnea (adult) (pediatric): Secondary | ICD-10-CM | POA: Diagnosis not present

## 2022-12-01 DIAGNOSIS — H04123 Dry eye syndrome of bilateral lacrimal glands: Secondary | ICD-10-CM | POA: Diagnosis not present

## 2022-12-01 DIAGNOSIS — H524 Presbyopia: Secondary | ICD-10-CM | POA: Diagnosis not present

## 2022-12-01 DIAGNOSIS — H02831 Dermatochalasis of right upper eyelid: Secondary | ICD-10-CM | POA: Diagnosis not present

## 2022-12-01 DIAGNOSIS — H40033 Anatomical narrow angle, bilateral: Secondary | ICD-10-CM | POA: Diagnosis not present

## 2022-12-01 DIAGNOSIS — H25813 Combined forms of age-related cataract, bilateral: Secondary | ICD-10-CM | POA: Diagnosis not present

## 2023-01-24 ENCOUNTER — Encounter: Payer: Self-pay | Admitting: Internal Medicine

## 2023-01-26 DIAGNOSIS — I872 Venous insufficiency (chronic) (peripheral): Secondary | ICD-10-CM | POA: Diagnosis not present

## 2023-01-26 DIAGNOSIS — E669 Obesity, unspecified: Secondary | ICD-10-CM | POA: Diagnosis not present

## 2023-01-26 DIAGNOSIS — I110 Hypertensive heart disease with heart failure: Secondary | ICD-10-CM | POA: Diagnosis not present

## 2023-01-26 DIAGNOSIS — I5022 Chronic systolic (congestive) heart failure: Secondary | ICD-10-CM | POA: Diagnosis not present

## 2023-01-29 ENCOUNTER — Ambulatory Visit (INDEPENDENT_AMBULATORY_CARE_PROVIDER_SITE_OTHER): Payer: PPO

## 2023-01-29 DIAGNOSIS — I442 Atrioventricular block, complete: Secondary | ICD-10-CM

## 2023-01-29 LAB — CUP PACEART REMOTE DEVICE CHECK
Battery Remaining Longevity: 26 mo
Battery Remaining Percentage: 28 %
Battery Voltage: 2.9 V
Date Time Interrogation Session: 20240418020014
Implantable Lead Connection Status: 753985
Implantable Lead Connection Status: 753985
Implantable Lead Implant Date: 20000217
Implantable Lead Implant Date: 20100414
Implantable Lead Location: 753858
Implantable Lead Location: 753860
Implantable Lead Model: 4196
Implantable Pulse Generator Implant Date: 20180418
Lead Channel Impedance Value: 1050 Ohm
Lead Channel Impedance Value: 410 Ohm
Lead Channel Pacing Threshold Amplitude: 0.75 V
Lead Channel Pacing Threshold Amplitude: 0.75 V
Lead Channel Pacing Threshold Pulse Width: 0.5 ms
Lead Channel Pacing Threshold Pulse Width: 0.8 ms
Lead Channel Sensing Intrinsic Amplitude: 12 mV
Lead Channel Setting Pacing Amplitude: 2 V
Lead Channel Setting Pacing Amplitude: 2.5 V
Lead Channel Setting Pacing Pulse Width: 0.5 ms
Lead Channel Setting Pacing Pulse Width: 0.8 ms
Lead Channel Setting Sensing Sensitivity: 4 mV
Pulse Gen Model: 3222
Pulse Gen Serial Number: 8013108

## 2023-03-02 NOTE — Progress Notes (Signed)
Remote pacemaker transmission.   

## 2023-04-23 ENCOUNTER — Ambulatory Visit: Payer: PPO | Admitting: Internal Medicine

## 2023-04-23 ENCOUNTER — Encounter: Payer: Self-pay | Admitting: Internal Medicine

## 2023-04-23 VITALS — BP 150/86 | HR 72 | Ht 73.0 in | Wt 260.4 lb

## 2023-04-23 DIAGNOSIS — I4821 Permanent atrial fibrillation: Secondary | ICD-10-CM

## 2023-04-23 DIAGNOSIS — I442 Atrioventricular block, complete: Secondary | ICD-10-CM

## 2023-04-23 NOTE — Progress Notes (Signed)
Patient Care Team: Geoffry Paradise, MD as PCP - General (Internal Medicine) Duke Salvia, MD as PCP - Cardiology (Cardiology)   HPI  [name] Page is a 77 y.o. male Seen in followup for CRT-PM placement following AV ablation for permanent and rapid Afib years ago.   The patient denies chest pain, shortness of breath, nocturnal dyspnea, orthopnea or peripheral edema.  There have been no palpitations, lightheadedness or syncope.         DATE TEST EF   7/18 Echo   55-60 %   7/22 Echo  50-55%      Date Cr K Hgb  4/18 0.8  14  2/21 0.77  11.1<  4/23 0.67 3.9 15.0             ; Records and Results Reviewed   Past Medical History:  Diagnosis Date   Anal fissure    Anal fistula    Arthritis    Atrial fibrillation (HCC)    Blood transfusion without reported diagnosis    Cardiomyopathy    Nonishemic, possibly tachycardia-mediated. 1997 LHC with no significant CAD. Myoview (7/09) showed EF 44% with possible mild inferior ischemia. Echo (9/10): EF 60%   Cataract    CHF (congestive heart failure) (HCC)    Chronic atrial fibrillation (HCC)    s/p AV nodal ablation and on Coumadin. Patient developed dyspnea/fatigue with RV  pacing and an LV lead was placed   Colon polyp    hyperplastic and adenomatous   GERD (gastroesophageal reflux disease)    HTN (hypertension)    Hyperlipidemia    Hyperplastic colon polyp    and adenomatous   Osteoarthritis of right shoulder region    Pacemaker    St. Jude CRT-P device   Pseudogout    Sleep apnea    Upper GI bleed    With duodenal ulcer in 2005.  Patient was on ASA and coumadin at that time. ASA was stopped..    Past Surgical History:  Procedure Laterality Date   BIV PACEMAKER GENERATOR CHANGEOUT N/A 01/28/2017   Procedure: BiV Pacemaker Generator Changeout;  Surgeon: Duke Salvia, MD;  Location: [facility];  Service: Cardiovascular;  Laterality: N/A;   CIRCUMCISION N/A 02/07/2022   Procedure:  CIRCUMCISION ADULT;  Surgeon: Jannifer Hick, MD;  Location: [facility];  Service: Urology;  Laterality: N/A;   PACEMAKER INSERTION     St. Jude's   SHOULDER SURGERY     Right   TREATMENT FISTULA ANAL     WISDOM TOOTH EXTRACTION      Current Outpatient Medications  Medication Sig Dispense Refill   carvedilol (COREG) 25 MG tablet Take 25 mg by mouth 2 (two) times daily with a meal.     colchicine 0.6 MG tablet Take 0.6 mg by mouth daily as needed (gout flare).      docusate sodium (COLACE) 100 MG capsule Take 1 capsule (100 mg total) by mouth daily as needed for up to 30 doses. 30 capsule 0   ELIQUIS 5 MG TABS tablet Take 5 mg by mouth 2 (two) times daily.     furosemide (LASIX) 40 MG tablet Take 40 mg by mouth daily as needed (swelling).     hydrochlorothiazide 25 MG tablet Take 25 mg by mouth daily.     Multiple Vitamin (MULTIVITAMIN WITH MINERALS) TABS tablet Take 1 tablet by mouth daily.     potassium chloride SA (K-DUR,KLOR-CON) 20 MEQ tablet Take 20 mEq by  mouth daily as needed (When you take Lasix).     ramipril (ALTACE) 10 MG capsule Take 10 mg by mouth 2 (two) times daily.     rosuvastatin (CRESTOR) 20 MG tablet Take 20 mg by mouth daily.     empagliflozin (JARDIANCE) 10 MG TABS tablet Take 1 tablet (10 mg total) by mouth daily before breakfast. (Patient not taking: Reported on 04/23/2023) 90 tablet 3   No current facility-administered medications for this visit.    No Known Allergies    Review of Systems negative except from HPI and PMH  Physical Exam BP (!) 150/86   Pulse 72   Ht 6\' 1"  (1.854 m)   Wt 260 lb 6.4 oz (118.1 kg)   SpO2 97%   BMI 34.36 kg/m  Well developed and well nourished in no acute distress HENT normal Neck supple with JVP-flat Clear Device pocket well healed; without hematoma or erythema.  There is no tethering  Regular rate and rhythm, no  murmur Abd-soft with active BS No Clubbing cyanosis  edema Skin-warm and dry A & Oriented  Grossly normal  sensory and motor function  ECG afib V pacing 72 -/15/46__ Upright QRS lead V1 negative QRS lead I  Device function is normal. No Programming changes   See Paceart for details     Assessment and  Plan Atrial fibrillation-permanent  Complete heart block status post AV ablation  Pacemaker-CRT-St. Jude  Cardiomyopathy-recovered  HFrecEF   Atrial fibrillation is permanent.  QRS is relatively wide but he continues to do exceedingly well with CRT.  Continue carvedilol and ramipril for his recovered cardiomyopathy  No bleeding.  Continue Eliquis 5 mg yet twice daily  Euvolemic.  Continue his furosemide as needed HydroDIURIL daily.

## 2023-04-23 NOTE — Patient Instructions (Signed)
Medication Instructions:  Your physician recommends that you continue on your current medications as directed. Please refer to the Current Medication list given to you today. *If you need a refill on your cardiac medications before your next appointment, please call your pharmacy*   Follow-Up: At [facility], you and your health needs are our priority.  As part of our continuing mission to provide you with exceptional heart care, we have created designated Provider Care Teams.  These Care Teams include your primary Cardiologist (physician) and Advanced Practice Providers (APPs -  Physician Assistants and Nurse Practitioners) who all work together to provide you with the care you need, when you need it.  We recommend signing up for the patient portal called "MyChart".  Sign up information is provided on this After Visit Summary.  MyChart is used to connect with patients for Virtual Visits (Telemedicine).  Patients are able to view lab/test results, encounter notes, upcoming appointments, etc.  Non-urgent messages can be sent to your provider as well.   To learn more about what you can do with MyChart, go to [email or web address].    Your next appointment:   1 year(s)  Provider:   Steven Klein, MD   

## 2023-04-30 ENCOUNTER — Ambulatory Visit: Payer: PPO

## 2023-06-10 DIAGNOSIS — G4733 Obstructive sleep apnea (adult) (pediatric): Secondary | ICD-10-CM | POA: Diagnosis not present

## 2023-07-27 DIAGNOSIS — E785 Hyperlipidemia, unspecified: Secondary | ICD-10-CM | POA: Diagnosis not present

## 2023-07-27 DIAGNOSIS — Z0189 Encounter for other specified special examinations: Secondary | ICD-10-CM | POA: Diagnosis not present

## 2023-07-27 DIAGNOSIS — Z0000 Encounter for general adult medical examination without abnormal findings: Secondary | ICD-10-CM | POA: Diagnosis not present

## 2023-07-27 DIAGNOSIS — I10 Essential (primary) hypertension: Secondary | ICD-10-CM | POA: Diagnosis not present

## 2023-07-27 DIAGNOSIS — M109 Gout, unspecified: Secondary | ICD-10-CM | POA: Diagnosis not present

## 2023-07-27 DIAGNOSIS — Z1212 Encounter for screening for malignant neoplasm of rectum: Secondary | ICD-10-CM | POA: Diagnosis not present

## 2023-07-27 DIAGNOSIS — I110 Hypertensive heart disease with heart failure: Secondary | ICD-10-CM | POA: Diagnosis not present

## 2023-07-30 ENCOUNTER — Ambulatory Visit (INDEPENDENT_AMBULATORY_CARE_PROVIDER_SITE_OTHER): Payer: PPO

## 2023-07-30 DIAGNOSIS — I4821 Permanent atrial fibrillation: Secondary | ICD-10-CM

## 2023-07-30 DIAGNOSIS — I442 Atrioventricular block, complete: Secondary | ICD-10-CM | POA: Diagnosis not present

## 2023-07-30 LAB — CUP PACEART REMOTE DEVICE CHECK
Battery Remaining Longevity: 20 mo
Battery Remaining Percentage: 22 %
Battery Voltage: 2.87 V
Date Time Interrogation Session: 20241017020013
Implantable Lead Connection Status: 753985
Implantable Lead Connection Status: 753985
Implantable Lead Implant Date: 20000217
Implantable Lead Implant Date: 20100414
Implantable Lead Location: 753858
Implantable Lead Location: 753860
Implantable Lead Model: 4196
Implantable Pulse Generator Implant Date: 20180418
Lead Channel Impedance Value: 1175 Ohm
Lead Channel Impedance Value: 390 Ohm
Lead Channel Pacing Threshold Amplitude: 0.75 V
Lead Channel Pacing Threshold Amplitude: 1 V
Lead Channel Pacing Threshold Pulse Width: 0.5 ms
Lead Channel Pacing Threshold Pulse Width: 0.8 ms
Lead Channel Sensing Intrinsic Amplitude: 11.7 mV
Lead Channel Setting Pacing Amplitude: 2 V
Lead Channel Setting Pacing Amplitude: 2.5 V
Lead Channel Setting Pacing Pulse Width: 0.5 ms
Lead Channel Setting Pacing Pulse Width: 0.8 ms
Lead Channel Setting Sensing Sensitivity: 4 mV
Pulse Gen Model: 3222
Pulse Gen Serial Number: 8013108

## 2023-08-02 DIAGNOSIS — M109 Gout, unspecified: Secondary | ICD-10-CM | POA: Diagnosis not present

## 2023-08-02 DIAGNOSIS — Z1212 Encounter for screening for malignant neoplasm of rectum: Secondary | ICD-10-CM | POA: Diagnosis not present

## 2023-08-02 DIAGNOSIS — Z0189 Encounter for other specified special examinations: Secondary | ICD-10-CM | POA: Diagnosis not present

## 2023-08-02 DIAGNOSIS — E785 Hyperlipidemia, unspecified: Secondary | ICD-10-CM | POA: Diagnosis not present

## 2023-08-02 DIAGNOSIS — I10 Essential (primary) hypertension: Secondary | ICD-10-CM | POA: Diagnosis not present

## 2023-08-02 DIAGNOSIS — I110 Hypertensive heart disease with heart failure: Secondary | ICD-10-CM | POA: Diagnosis not present

## 2023-08-02 DIAGNOSIS — Z0000 Encounter for general adult medical examination without abnormal findings: Secondary | ICD-10-CM | POA: Diagnosis not present

## 2023-08-03 DIAGNOSIS — E785 Hyperlipidemia, unspecified: Secondary | ICD-10-CM | POA: Diagnosis not present

## 2023-08-03 DIAGNOSIS — G4730 Sleep apnea, unspecified: Secondary | ICD-10-CM | POA: Diagnosis not present

## 2023-08-03 DIAGNOSIS — Z1339 Encounter for screening examination for other mental health and behavioral disorders: Secondary | ICD-10-CM | POA: Diagnosis not present

## 2023-08-03 DIAGNOSIS — Z950 Presence of cardiac pacemaker: Secondary | ICD-10-CM | POA: Diagnosis not present

## 2023-08-03 DIAGNOSIS — N401 Enlarged prostate with lower urinary tract symptoms: Secondary | ICD-10-CM | POA: Diagnosis not present

## 2023-08-03 DIAGNOSIS — Z0000 Encounter for general adult medical examination without abnormal findings: Secondary | ICD-10-CM | POA: Diagnosis not present

## 2023-08-03 DIAGNOSIS — Z0189 Encounter for other specified special examinations: Secondary | ICD-10-CM | POA: Diagnosis not present

## 2023-08-03 DIAGNOSIS — I872 Venous insufficiency (chronic) (peripheral): Secondary | ICD-10-CM | POA: Diagnosis not present

## 2023-08-03 DIAGNOSIS — D6869 Other thrombophilia: Secondary | ICD-10-CM | POA: Diagnosis not present

## 2023-08-03 DIAGNOSIS — I4891 Unspecified atrial fibrillation: Secondary | ICD-10-CM | POA: Diagnosis not present

## 2023-08-03 DIAGNOSIS — R82998 Other abnormal findings in urine: Secondary | ICD-10-CM | POA: Diagnosis not present

## 2023-08-03 DIAGNOSIS — I5022 Chronic systolic (congestive) heart failure: Secondary | ICD-10-CM | POA: Diagnosis not present

## 2023-08-03 DIAGNOSIS — Z1331 Encounter for screening for depression: Secondary | ICD-10-CM | POA: Diagnosis not present

## 2023-08-03 DIAGNOSIS — M1990 Unspecified osteoarthritis, unspecified site: Secondary | ICD-10-CM | POA: Diagnosis not present

## 2023-08-03 DIAGNOSIS — I110 Hypertensive heart disease with heart failure: Secondary | ICD-10-CM | POA: Diagnosis not present

## 2023-08-03 DIAGNOSIS — M109 Gout, unspecified: Secondary | ICD-10-CM | POA: Diagnosis not present

## 2023-08-11 ENCOUNTER — Telehealth: Payer: Self-pay

## 2023-08-11 DIAGNOSIS — M19011 Primary osteoarthritis, right shoulder: Secondary | ICD-10-CM | POA: Diagnosis not present

## 2023-08-11 NOTE — Telephone Encounter (Signed)
   Pre-operative Risk Assessment    Patient Name: [name] Page  DOB: 1946/08/11 MRN: [id number]  Date of last visit: 04/23/23 Dr Sherryl Manges Date of next visit: none    Request for Surgical Clearance    Procedure:   Right reverse total shoulder arthroplasty  Date of Surgery:  Clearance TBD                                 Surgeon:  Dr Malon Kindle Surgeon's Group or Practice Name:  [facility] Phone number:  [phone] Fax number:  [phone]   Type of Clearance Requested:   - Medical  - Pharmacy:  Hold Apixaban (Eliquis)     Type of Anesthesia:   Choice   Additional requests/questions:    Signed, Brynlie Daza Penina Reisner   08/11/2023, 5:00 PM

## 2023-08-11 NOTE — Telephone Encounter (Signed)
Pharmacy please advise on holding Eliquis prior to right reverse total shoulder arthroplasty scheduled for TBD. Thank you.

## 2023-08-12 NOTE — Telephone Encounter (Signed)
Patient with diagnosis of afib on Eliquis for anticoagulation.    Procedure: Right reverse total shoulder arthroplasty  Date of procedure: TBD  CHA2DS2-VASc Score = 4  This indicates a 4.8% annual risk of stroke. The patient's score is based upon: CHF History: 1 HTN History: 1 Diabetes History: 0 Stroke History: 0 Vascular Disease History: 0 Age Score: 2 Gender Score: 0   CrCl 134mL/min Platelet count 200K  Per office protocol, patient can hold Eliquis for 3 days prior to procedure.    **This guidance is not considered finalized until pre-operative APP has relayed final recommendations.**

## 2023-08-12 NOTE — Telephone Encounter (Signed)
1st attempt to contact the patient to schedule tele preop clearance appt, [id number]

## 2023-08-12 NOTE — Telephone Encounter (Signed)
   Name: [name] Page  DOB: Apr 29, 1946  MRN: [id number]  Primary Cardiologist: Sherryl Manges, MD   Preoperative team, please contact this patient and set up a phone call appointment for further preoperative risk assessment. Please obtain consent and complete medication review. Thank you for your help.  I confirm that guidance regarding antiplatelet and oral anticoagulation therapy has been completed and, if necessary, noted below.  Per office protocol, patient can hold Eliquis for 3 days prior to procedure.    I also confirmed the patient resides in the state of [place]. As per [facility] Medical Board telemedicine laws, the patient must reside in the state in which the provider is licensed.   Napoleon Form, Leodis Rains, NP 08/12/2023, 8:35 AM [facility]

## 2023-08-18 NOTE — Progress Notes (Signed)
Remote pacemaker transmission.   

## 2023-08-21 ENCOUNTER — Ambulatory Visit: Payer: PPO | Attending: Nurse Practitioner

## 2023-08-21 DIAGNOSIS — Z01810 Encounter for preprocedural cardiovascular examination: Secondary | ICD-10-CM

## 2023-08-21 NOTE — Progress Notes (Signed)
Virtual Visit via Telephone Note   Because of [name] Page's co-morbid illnesses, he is at least at moderate risk for complications without adequate follow up.  This format is felt to be most appropriate for this patient at this time.  The patient did not have access to video technology/had technical difficulties with video requiring transitioning to audio format only (telephone).  All issues noted in this document were discussed and addressed.  No physical exam could be performed with this format.  Please refer to the patient's chart for his consent to telehealth for [facility].  Evaluation Performed:  Preoperative cardiovascular risk assessment _____________   Date:  08/21/2023   Patient ID:  [name] Page, DOB May 19, 1946, MRN [id number] Patient Location:  Home Provider location:   Office  Primary Care Provider:  Geoffry Paradise, MD Primary Cardiologist:  Sherryl Manges, MD  Chief Complaint / Patient Profile   77 y.o. y/o male with a h/o NICM s/p Saint Jude CRT-P, HFrEF, chronic AF, HTN, HLD who is pending right reverse total shoulder arthroplasty and presents today for telephonic preoperative cardiovascular risk assessment.  History of Present Illness    [name] Page is a 77 y.o. male who presents via audio/video conferencing for a telehealth visit today.  Pt was last seen in cardiology clinic on 04/23/2023 by Dr. Graciela Husbands.  At that time [name] Page was doing well with no complaint of chest pain or shortness of breath.  The patient is now pending procedure as outlined above. Since his last visit, he    He denies chest pain, shortness of breath, lower extremity edema, fatigue, palpitations, melena, hematuria, hemoptysis, diaphoresis, weakness, presyncope, syncope, orthopnea, and PND.    Past Medical History    Past Medical History:  Diagnosis Date   Anal fissure    Anal fistula    Arthritis    Atrial fibrillation (HCC)    Blood transfusion without reported  diagnosis    Cardiomyopathy    Nonishemic, possibly tachycardia-mediated. 1997 LHC with no significant CAD. Myoview (7/09) showed EF 44% with possible mild inferior ischemia. Echo (9/10): EF 60%   Cataract    CHF (congestive heart failure) (HCC)    Chronic atrial fibrillation (HCC)    s/p AV nodal ablation and on Coumadin. Patient developed dyspnea/fatigue with RV  pacing and an LV lead was placed   Colon polyp    hyperplastic and adenomatous   GERD (gastroesophageal reflux disease)    HTN (hypertension)    Hyperlipidemia    Hyperplastic colon polyp    and adenomatous   Osteoarthritis of right shoulder region    Pacemaker    St. Jude CRT-P device   Pseudogout    Sleep apnea    Upper GI bleed    With duodenal ulcer in 2005.  Patient was on ASA and coumadin at that time. ASA was stopped..   Past Surgical History:  Procedure Laterality Date   BIV PACEMAKER GENERATOR CHANGEOUT N/A 01/28/2017   Procedure: BiV Pacemaker Generator Changeout;  Surgeon: Duke Salvia, MD;  [facility]: [facility] INVASIVE CV LAB;  Service: Cardiovascular;  Laterality: N/A;   CIRCUMCISION N/A 02/07/2022   Procedure: CIRCUMCISION ADULT;  Surgeon: Jannifer Hick, MD;  Location: [facility] ORS;  Service: Urology;  Laterality: N/A;   PACEMAKER INSERTION     St. Jude's   SHOULDER SURGERY     Right   TREATMENT FISTULA ANAL     WISDOM TOOTH EXTRACTION      Allergies  No Known Allergies  Home Medications    Prior to Admission medications   Medication Sig Start Date End Date Taking? Authorizing Provider  carvedilol (COREG) 25 MG tablet Take 25 mg by mouth 2 (two) times daily with a meal.    [provider]  colchicine 0.6 MG tablet Take 0.6 mg by mouth daily as needed (gout flare).  03/01/15   [provider]  docusate sodium (COLACE) 100 MG capsule Take 1 capsule (100 mg total) by mouth daily as needed for up to 30 doses. 02/07/22   Jannifer Hick, MD  ELIQUIS 5 MG TABS tablet Take 5 mg by mouth 2 (two)  times daily. 06/01/15   [provider]  empagliflozin (JARDIANCE) 10 MG TABS tablet Take 1 tablet (10 mg total) by mouth daily before breakfast. Patient not taking: Reported on 04/23/2023 04/08/22   Duke Salvia, MD  furosemide (LASIX) 40 MG tablet Take 40 mg by mouth daily as needed (swelling).    [provider]  hydrochlorothiazide 25 MG tablet Take 25 mg by mouth daily.    [provider]  Multiple Vitamin (MULTIVITAMIN WITH MINERALS) TABS tablet Take 1 tablet by mouth daily.    [provider]  potassium chloride SA (K-DUR,KLOR-CON) 20 MEQ tablet Take 20 mEq by mouth daily as needed (When you take Lasix).    [provider]  ramipril (ALTACE) 10 MG capsule Take 10 mg by mouth 2 (two) times daily.    [provider]  rosuvastatin (CRESTOR) 20 MG tablet Take 20 mg by mouth daily.    [provider]    Physical Exam    Vital Signs:  [name] Page does not have vital signs available for review today.  Given telephonic nature of communication, physical exam is limited. AAOx3. NAD. Normal affect.  Speech and respirations are unlabored.  Accessory Clinical Findings    None  Assessment & Plan    1.  Preoperative Cardiovascular Risk Assessment: -Patient's RCRI score is 6.6%  The patient affirms he has been doing well without any new cardiac symptoms. They are able to achieve 7 METS without cardiac limitations. Therefore, based on ACC/AHA guidelines, the patient would be at acceptable risk for the planned procedure without further cardiovascular testing. The patient was advised that if he develops new symptoms prior to surgery to contact our office to arrange for a follow-up visit, and he verbalized understanding.   The patient was advised that if he develops new symptoms prior to surgery to contact our office to arrange for a follow-up visit, and he verbalized understanding.  Per office protocol, patient can hold Eliquis for  3 days prior to procedure.    A copy of this note will be routed to requesting surgeon.  Time:   Today, I have spent 8 minutes with the patient with telehealth technology discussing medical history, symptoms, and management plan.     Napoleon Form, Leodis Rains, NP  08/21/2023, 7:02 AM

## 2023-08-25 ENCOUNTER — Encounter: Payer: Self-pay | Admitting: Internal Medicine

## 2023-08-25 NOTE — Progress Notes (Signed)
PERIOPERATIVE PRESCRIPTION FOR IMPLANTED CARDIAC DEVICE PROGRAMMING  Patient Information: Name:  [name] Page  DOB:  1946-10-10  MRN:  [id number]   Procedure:   Right reverse total shoulder arthroplasty   Date of Surgery:  Clearance TBD                                 Surgeon:  Dr Malon Kindle Surgeon's Group or Practice Name:  [facility] Phone number:  [phone] Fax number:  [phone]  Device Information:  Clinic EP Physician:  Sherryl Manges, MD   Device Type:  Pacemaker Manufacturer and Phone #:  St. Jude/Abbott: [phone] Pacemaker Dependent?:  Yes.   Date of Last Device Check:  07/30/2023 Normal Device Function?:  Yes.    Electrophysiologist's Recommendations:  Have magnet available. Provide continuous ECG monitoring when magnet is used or reprogramming is to be performed.  Procedure may interfere with device function.  Magnet should be placed over device during procedure.  If this interferes with sterile field advise Asynchronous pacing during procedure and returned to normal programming after procedure  Per Device Clinic Standing Orders, Wiliam Ke, RN  10:47 AM 08/25/2023

## 2023-09-11 DIAGNOSIS — G4733 Obstructive sleep apnea (adult) (pediatric): Secondary | ICD-10-CM | POA: Diagnosis not present

## 2023-10-29 ENCOUNTER — Ambulatory Visit (INDEPENDENT_AMBULATORY_CARE_PROVIDER_SITE_OTHER): Payer: PPO

## 2023-10-29 DIAGNOSIS — I442 Atrioventricular block, complete: Secondary | ICD-10-CM | POA: Diagnosis not present

## 2023-10-29 LAB — CUP PACEART REMOTE DEVICE CHECK
Battery Remaining Longevity: 16 mo
Battery Remaining Percentage: 19 %
Battery Voltage: 2.84 V
Date Time Interrogation Session: 20250116020016
Implantable Lead Connection Status: 753985
Implantable Lead Connection Status: 753985
Implantable Lead Implant Date: 20000217
Implantable Lead Implant Date: 20100414
Implantable Lead Location: 753858
Implantable Lead Location: 753860
Implantable Lead Model: 4196
Implantable Pulse Generator Implant Date: 20180418
Lead Channel Impedance Value: 1025 Ohm
Lead Channel Impedance Value: 390 Ohm
Lead Channel Pacing Threshold Amplitude: 0.625 V
Lead Channel Pacing Threshold Amplitude: 1 V
Lead Channel Pacing Threshold Pulse Width: 0.5 ms
Lead Channel Pacing Threshold Pulse Width: 0.8 ms
Lead Channel Sensing Intrinsic Amplitude: 11.7 mV
Lead Channel Setting Pacing Amplitude: 2 V
Lead Channel Setting Pacing Amplitude: 2.5 V
Lead Channel Setting Pacing Pulse Width: 0.5 ms
Lead Channel Setting Pacing Pulse Width: 0.8 ms
Lead Channel Setting Sensing Sensitivity: 4 mV
Pulse Gen Model: 3222
Pulse Gen Serial Number: 8013108

## 2023-11-04 NOTE — Progress Notes (Deleted)
[facility] [address] [place]. [place] [address] [phone]       OFFICE FOLLOW UP NOTE  [name] Date of Birth:  Nov 02, 1945 Medical Record Number:  [id number]   Reason for visit: Initial CPAP follow-up    SUBJECTIVE:   CHIEF COMPLAINT:  No chief complaint on file.   Follow-up visit:  Prior visit: 11/10/2022 with Amy, NP  Brief HPI:   [name] Page is a 78 y.o. male who is being followed for OSA on CPAP.  Sleep study 04/2014 with total AHI 25/h and O2 nadir of 73%, he was titrated on CPAP.  Most current machine received 11/2021.  At prior visit with Amy, NP, compliance report showed excellent usage and optimal residual AHI.  Continues to do well on CPAP.     Interval history:        ROS:   14 system review of systems performed and negative with exception of those listed in HPI  PMH:  Past Medical History:  Diagnosis Date   Anal fissure    Anal fistula    Arthritis    Atrial fibrillation (HCC)    Blood transfusion without reported diagnosis    Cardiomyopathy    Nonishemic, possibly tachycardia-mediated. 1997 LHC with no significant CAD. Myoview (7/09) showed EF 44% with possible mild inferior ischemia. Echo (9/10): EF 60%   Cataract    CHF (congestive heart failure) (HCC)    Chronic atrial fibrillation (HCC)    s/p AV nodal ablation and on Coumadin. Patient developed dyspnea/fatigue with RV  pacing and an LV lead was placed   Colon polyp    hyperplastic and adenomatous   GERD (gastroesophageal reflux disease)    HTN (hypertension)    Hyperlipidemia    Hyperplastic colon polyp    and adenomatous   Osteoarthritis of right shoulder region    Pacemaker    St. Jude CRT-P device   Pseudogout    Sleep apnea    Upper GI bleed    With duodenal ulcer in 2005.  Patient was on ASA and coumadin at that time. ASA was stopped..    PSH:  Past Surgical History:  Procedure Laterality Date   BIV PACEMAKER GENERATOR CHANGEOUT  N/A 01/28/2017   Procedure: BiV Pacemaker Generator Changeout;  Surgeon: Duke Salvia, MD;  Location: [facility] INVASIVE CV LAB;  Service: Cardiovascular;  Laterality: N/A;   CIRCUMCISION N/A 02/07/2022   Procedure: CIRCUMCISION ADULT;  Surgeon: Jannifer Hick, MD;  Location: [facility];  Service: Urology;  Laterality: N/A;   PACEMAKER INSERTION     St. Jude's   SHOULDER SURGERY     Right   TREATMENT FISTULA ANAL     WISDOM TOOTH EXTRACTION      Social History:  Social History   Socioeconomic History   Marital status: Married    Spouse name: Mardene Celeste   Number of children: 0   Years of education: some coll.   Highest education level: Not on file  Occupational History   Occupation: Investment banker, corporate: [employer]  Tobacco Use   Smoking status: Never   Smokeless tobacco: Never  Vaping Use   Vaping status: Never Used  Substance and Sexual Activity   Alcohol use: Yes    Alcohol/week: 0.0 standard drinks of alcohol    Comment: Very rare   Drug use: No   Sexual activity: Not on file  Other Topics Concern   Not on file  Social History Narrative  Lives in [place].   Does a lot of traveling.   Exercises daily.   Daily Caffeine use.   Patient is right handed   Social Drivers of Corporate investment banker Strain: Not on file  Food Insecurity: Not on file  Transportation Needs: Not on file  Physical Activity: Not on file  Stress: Not on file  Social Connections: Not on file  Intimate Partner Violence: Not on file    Family History:  Family History  Problem Relation Age of Onset   Heart disease Mother    Stroke Father    Diverticulitis Sister    Diabetes Sister    Diabetes Sister    Colon cancer Neg Hx     Medications:   Current Outpatient Medications on File Prior to Visit  Medication Sig Dispense Refill   carvedilol (COREG) 25 MG tablet Take 25 mg by mouth 2 (two) times daily with a meal.     colchicine 0.6 MG tablet Take 0.6 mg by mouth daily as needed  (gout flare).      docusate sodium (COLACE) 100 MG capsule Take 1 capsule (100 mg total) by mouth daily as needed for up to 30 doses. 30 capsule 0   ELIQUIS 5 MG TABS tablet Take 5 mg by mouth 2 (two) times daily.     empagliflozin (JARDIANCE) 10 MG TABS tablet Take 1 tablet (10 mg total) by mouth daily before breakfast. (Patient not taking: Reported on 04/23/2023) 90 tablet 3   furosemide (LASIX) 40 MG tablet Take 40 mg by mouth daily as needed (swelling).     hydrochlorothiazide 25 MG tablet Take 25 mg by mouth daily.     Multiple Vitamin (MULTIVITAMIN WITH MINERALS) TABS tablet Take 1 tablet by mouth daily.     potassium chloride SA (K-DUR,KLOR-CON) 20 MEQ tablet Take 20 mEq by mouth daily as needed (When you take Lasix).     ramipril (ALTACE) 10 MG capsule Take 10 mg by mouth 2 (two) times daily.     rosuvastatin (CRESTOR) 20 MG tablet Take 20 mg by mouth daily.     No current facility-administered medications on file prior to visit.    Allergies:  No Known Allergies    OBJECTIVE:  Physical Exam  There were no vitals filed for this visit. There is no height or weight on file to calculate BMI. No results found.   General: well developed, well nourished, seated, in no evident distress Head: head normocephalic and atraumatic.   Neck: supple with no carotid or supraclavicular bruits Cardiovascular: regular rate and rhythm, no murmurs Musculoskeletal: no deformity Skin:  no rash/petichiae Vascular:  Normal pulses all extremities   Neurologic Exam Mental Status: Awake and fully alert. Oriented to place and time. Recent and remote memory intact. Attention span, concentration and fund of knowledge appropriate. Mood and affect appropriate.  Cranial Nerves: Pupils equal, briskly reactive to light. Extraocular movements full without nystagmus. Visual fields full to confrontation. Hearing intact. Facial sensation intact. Face, tongue, palate moves normally and symmetrically.  Motor:  Normal bulk and tone. Normal strength in all tested extremity muscles Sensory.: intact to touch , pinprick , position and vibratory sensation.  Coordination: Rapid alternating movements normal in all extremities. Finger-to-nose and heel-to-shin performed accurately bilaterally. Gait and Station: Arises from chair without difficulty. Stance is normal. Gait demonstrates normal stride length and balance without use of AD. Tandem walk and heel toe without difficulty.  Reflexes: 1+ and symmetric. Toes downgoing.  ASSESSMENT/PLAN: [name] Page is a 78 y.o. year old male    OSA on CPAP : Compliance report shows satisfactory usage with optimal residual AHI.  Discussed continued nightly usage with ensuring greater than 4 hours nightly for optimal benefit and per insurance purposes.  Continue to follow with DME company for any needed supplies or CPAP related concerns     Follow up in *** or call earlier if needed   CC:  PCP: Geoffry Paradise, MD    I spent *** minutes of face-to-face and non-face-to-face time with patient.  This included previsit chart review, lab review, study review, order entry, electronic health record documentation, patient education and discussion regarding above diagnoses and treatment plan and answered all other questions to patient's satisfaction    Ihor Austin, [facility]  [facility] [address] [place], [place] [address]  Phone [phone] Fax [phone] Note: This document was prepared with digital dictation and possible smart phrase technology. Any transcriptional errors that result from this process are unintentional.

## 2023-11-09 ENCOUNTER — Telehealth: Payer: PPO | Admitting: Adult Health

## 2023-11-09 ENCOUNTER — Telehealth: Payer: Self-pay | Admitting: Adult Health

## 2023-11-09 ENCOUNTER — Telehealth: Payer: Self-pay | Admitting: Family Medicine

## 2023-11-09 ENCOUNTER — Ambulatory Visit: Payer: PPO | Admitting: Adult Health

## 2023-11-09 NOTE — Telephone Encounter (Signed)
Phone rep advised by Shanda Bumps, NP that this f/u needs to be with Amy, NP.  Pt was called and informed, the appointment that was with Shanda Bumps, NP has been cx. Pt has now been scheduled with Amy, NP and is on wait list.

## 2023-11-09 NOTE — Telephone Encounter (Signed)
Provider Out sick sent message

## 2023-11-09 NOTE — Telephone Encounter (Signed)
Pt called so appointment could be r/s, he does not have a smart phone or camera to do a my chart vv.

## 2023-12-04 DIAGNOSIS — H04123 Dry eye syndrome of bilateral lacrimal glands: Secondary | ICD-10-CM | POA: Diagnosis not present

## 2023-12-04 DIAGNOSIS — H11152 Pinguecula, left eye: Secondary | ICD-10-CM | POA: Diagnosis not present

## 2023-12-04 DIAGNOSIS — H524 Presbyopia: Secondary | ICD-10-CM | POA: Diagnosis not present

## 2023-12-04 DIAGNOSIS — H25813 Combined forms of age-related cataract, bilateral: Secondary | ICD-10-CM | POA: Diagnosis not present

## 2023-12-04 DIAGNOSIS — H40033 Anatomical narrow angle, bilateral: Secondary | ICD-10-CM | POA: Diagnosis not present

## 2023-12-04 DIAGNOSIS — H02834 Dermatochalasis of left upper eyelid: Secondary | ICD-10-CM | POA: Diagnosis not present

## 2023-12-04 DIAGNOSIS — H02831 Dermatochalasis of right upper eyelid: Secondary | ICD-10-CM | POA: Diagnosis not present

## 2023-12-07 NOTE — Progress Notes (Signed)
 Remote pacemaker transmission.

## 2023-12-09 ENCOUNTER — Encounter: Payer: Self-pay | Admitting: Internal Medicine

## 2023-12-13 DIAGNOSIS — G4733 Obstructive sleep apnea (adult) (pediatric): Secondary | ICD-10-CM | POA: Diagnosis not present

## 2023-12-28 ENCOUNTER — Encounter: Payer: Self-pay | Admitting: Internal Medicine

## 2023-12-29 NOTE — H&P (Signed)
 Patient's anticipated LOS is less than 2 midnights, meeting these requirements: - Younger than 37 - Lives within 1 hour of care - Has a competent adult at home to recover with post-op recover - NO history of  - Chronic pain requiring opiods  - Diabetes  - Coronary Artery Disease  - Heart failure  - Heart attack  - Stroke  - DVT/VTE  - Cardiac arrhythmia  - Respiratory Failure/COPD  - Renal failure  - Anemia  - Advanced Liver disease     [name] Page is an 78 y.o. male.    Chief Complaint: right shoulder pain  HPI: Pt is a 78 y.o. male complaining of right shoulder pain for multiple years. Pain had continually increased since the beginning. X-rays in the clinic show end-stage arthritic changes of the right shoulder. Pt has tried various conservative treatments which have failed to alleviate their symptoms, including injections and therapy. Various options are discussed with the patient. Risks, benefits and expectations were discussed with the patient. Patient understand the risks, benefits and expectations and wishes to proceed with surgery.   PCP:  Geoffry Paradise, MD  D/C Plans: Home  PMH: Past Medical History:  Diagnosis Date   Anal fissure    Anal fistula    Arthritis    Atrial fibrillation (HCC)    Blood transfusion without reported diagnosis    Cardiomyopathy    Nonishemic, possibly tachycardia-mediated. 1997 LHC with no significant CAD. Myoview (7/09) showed EF 44% with possible mild inferior ischemia. Echo (9/10): EF 60%   Cataract    CHF (congestive heart failure) (HCC)    Chronic atrial fibrillation (HCC)    s/p AV nodal ablation and on Coumadin. Patient developed dyspnea/fatigue with RV  pacing and an LV lead was placed   Colon polyp    hyperplastic and adenomatous   GERD (gastroesophageal reflux disease)    HTN (hypertension)    Hyperlipidemia    Hyperplastic colon polyp    and adenomatous   Osteoarthritis of right shoulder region    Pacemaker     St. Jude CRT-P device   Pseudogout    Sleep apnea    Upper GI bleed    With duodenal ulcer in 2005.  Patient was on ASA and coumadin at that time. ASA was stopped..    PSH: Past Surgical History:  Procedure Laterality Date   BIV PACEMAKER GENERATOR CHANGEOUT N/A 01/28/2017   Procedure: BiV Pacemaker Generator Changeout;  Surgeon: Duke Salvia, MD;  Location: [facility] INVASIVE CV LAB;  Service: Cardiovascular;  Laterality: N/A;   CIRCUMCISION N/A 02/07/2022   Procedure: CIRCUMCISION ADULT;  Surgeon: Jannifer Hick, MD;  Location: [facility];  Service: Urology;  Laterality: N/A;   PACEMAKER INSERTION     St. Jude's   SHOULDER SURGERY     Right   TREATMENT FISTULA ANAL     WISDOM TOOTH EXTRACTION      Social History:  reports that he has never smoked. He has never used smokeless tobacco. He reports current alcohol use. He reports that he does not use drugs. BMI: Estimated body mass index is 34.36 kg/m as calculated from the following:   Height as of 04/23/23: 6\' 1"  (1.854 m).   Weight as of 04/23/23: 118.1 kg.  No results found for: "ALBUMIN" Diabetes: Patient does not have a diagnosis of diabetes.     Smoking Status:   reports that he has never smoked. He has never used smokeless tobacco.    Allergies:  No  Known Allergies  Medications: No current facility-administered medications for this encounter.   Current Outpatient Medications  Medication Sig Dispense Refill   carvedilol (COREG) 25 MG tablet Take 25 mg by mouth 2 (two) times daily with a meal.     colchicine 0.6 MG tablet Take 0.6 mg by mouth daily as needed (gout flare).      docusate sodium (COLACE) 100 MG capsule Take 1 capsule (100 mg total) by mouth daily as needed for up to 30 doses. 30 capsule 0   ELIQUIS 5 MG TABS tablet Take 5 mg by mouth 2 (two) times daily.     empagliflozin (JARDIANCE) 10 MG TABS tablet Take 1 tablet (10 mg total) by mouth daily before breakfast. (Patient not taking: Reported on 04/23/2023) 90  tablet 3   furosemide (LASIX) 40 MG tablet Take 40 mg by mouth daily as needed (swelling).     hydrochlorothiazide 25 MG tablet Take 25 mg by mouth daily.     Multiple Vitamin (MULTIVITAMIN WITH MINERALS) TABS tablet Take 1 tablet by mouth daily.     potassium chloride SA (K-DUR,KLOR-CON) 20 MEQ tablet Take 20 mEq by mouth daily as needed (When you take Lasix).     ramipril (ALTACE) 10 MG capsule Take 10 mg by mouth 2 (two) times daily.     rosuvastatin (CRESTOR) 20 MG tablet Take 20 mg by mouth daily.      No results found for this or any previous visit (from the past 48 hours). No results found.  ROS: Pain with rom of the right upper extremity  Physical Exam: Alert and oriented 78 y.o. male in no acute distress Cranial nerves 2-12 intact Cervical spine: full rom with no tenderness, nv intact distally Chest: active breath sounds bilaterally, no wheeze rhonchi or rales Heart: regular rate and rhythm, no murmur Abd: non tender non distended with active bowel sounds Hip is stable with rom  Right shoulder painful and weak rom Nv intact distally No rashes or edema  Assessment/Plan Assessment: right shoulder cuff arthropathy  Plan:  Patient will undergo a right reverse total shoulder by Dr. Ranell Patrick at [facility] Risks benefits and expectations were discussed with the patient. Patient understand risks, benefits and expectations and wishes to proceed. Preoperative templating of the joint replacement has been completed, documented, and submitted to the Operating Room personnel in order to optimize intra-operative equipment management.   Alphonsa Overall PA-C, MPAS [facility] is now [employer] [address], [place], [place] [address] Phone: [phone] [email or web address] Facebook  [employer]

## 2024-01-11 NOTE — Patient Instructions (Signed)
DUE TO COVID-19 ONLY TWO VISITORS  (aged 76 and older)  ARE ALLOWED TO COME WITH YOU AND STAY IN THE WAITING ROOM ONLY DURING PRE OP AND PROCEDURE.   **NO VISITORS ARE ALLOWED IN THE SHORT STAY AREA OR RECOVERY ROOM!!**  IF YOU WILL BE ADMITTED INTO THE HOSPITAL YOU ARE ALLOWED ONLY FOUR SUPPORT PEOPLE DURING VISITATION HOURS ONLY (7 AM -8PM)   The support person(s) must pass our screening, gel in and out, and wear a mask at all times, including in the patient's room. Patients must also wear a mask when staff or their support person are in the room. Visitors GUEST BADGE MUST BE WORN VISIBLY  One adult visitor may remain with you overnight and MUST be in the room by 8 P.M.     Your procedure is scheduled on: 01/22/24   Report to [facility] Main Entrance    Report to admitting at : 5:15 AM   Call this number if you have problems the morning of surgery [phone]   Do not eat food :After Midnight.   After Midnight you may have the following liquids until : 4:30 AM DAY OF SURGERY  Water Black Coffee (sugar ok, NO MILK/CREAM OR CREAMERS)  Tea (sugar ok, NO MILK/CREAM OR CREAMERS) regular and decaf                             Plain Jell-O (NO RED)                                           Fruit ices (not with fruit pulp, NO RED)                                     Popsicles (NO RED)                                                                  Juice: apple, WHITE grape, WHITE cranberry Sports drinks like Gatorade (NO RED)   The day of surgery:  Drink ONE (1) Pre-Surgery Clear Ensure or G2 at : 4:30 AM the morning of surgery. Drink in one sitting. Do not sip.  This drink was given to you during your hospital  pre-op appointment visit. Nothing else to drink after completing the  Pre-Surgery Clear Ensure or G2.          If you have questions, please contact your surgeon's office.  FOLLOW ANY ADDITIONAL PRE OP INSTRUCTIONS YOU RECEIVED FROM YOUR SURGEON'S OFFICE!!!    Oral Hygiene is also important to reduce your risk of infection.                                    Remember - BRUSH YOUR TEETH THE MORNING OF SURGERY WITH YOUR REGULAR TOOTHPASTE  DENTURES WILL BE REMOVED PRIOR TO SURGERY PLEASE DO NOT APPLY "Poly grip" OR ADHESIVES!!!   Do NOT smoke after Midnight   Take these medicines the morning  of surgery with A SIP OF WATER: carvedilol.Colchicine as needed.  STOP TAKING all Vitamins, Herbs and supplements 1 week before your surgery.   Bring CPAP mask and tubing day of surgery.                              You may not have any metal on your body including hair pins, jewelry, and body piercing             Do not wear lotions, powders, perfumes/cologne, or deodorant              Men may shave face and neck.   Do not bring valuables to the hospital. [place] IS NOT             RESPONSIBLE   FOR VALUABLES.   Contacts, glasses, or bridgework may not be worn into surgery.   Bring small overnight bag day of surgery.   DO NOT BRING YOUR HOME MEDICATIONS TO THE HOSPITAL. PHARMACY WILL DISPENSE MEDICATIONS LISTED ON YOUR MEDICATION LIST TO YOU DURING YOUR ADMISSION IN THE HOSPITAL!    Patients discharged on the day of surgery will not be allowed to drive home.  Someone NEEDS to stay with you for the first 24 hours after anesthesia.   Special Instructions: Bring a copy of your healthcare power of attorney and living will documents         the day of surgery if you haven't scanned them before.              Please read over the following fact sheets you were given: IF YOU HAVE QUESTIONS ABOUT YOUR PRE-OP INSTRUCTIONS PLEASE CALL [phone] [place]- Preparing for Total Shoulder Arthroplasty    Before surgery, you can play an important role. Because skin is not sterile, your skin needs to be as free of germs as possible. You can reduce the number of germs on your skin by using the following products. Benzoyl Peroxide Gel Reduces the number of  germs present on the skin Applied twice a day to shoulder area starting two days before surgery    ==================================================================  Please follow these instructions carefully:  BENZOYL PEROXIDE 5% GEL  Please do not use if you have an allergy to benzoyl peroxide.   If your skin becomes reddened/irritated stop using the benzoyl peroxide.  Starting two days before surgery, apply as follows: Apply benzoyl peroxide in the morning and at night. Apply after taking a shower. If you are not taking a shower clean entire shoulder front, back, and side along with the armpit with a clean wet washcloth.  Place a quarter-sized dollop on your shoulder and rub in thoroughly, making sure to cover the front, back, and side of your shoulder, along with the armpit.   2 days before ____ AM   ____ PM              1 day before ____ AM   ____ PM                         Do this twice a day for two days.  (Last application is the night before surgery, AFTER using the CHG soap as described below).  Do NOT apply benzoyl peroxide gel on the day of surgery.     Pre-operative 5 CHG Bath Instructions   You can play a key role in reducing the risk of infection  after surgery. Your skin needs to be as free of germs as possible. You can reduce the number of germs on your skin by washing with CHG (chlorhexidine gluconate) soap before surgery. CHG is an antiseptic soap that kills germs and continues to kill germs even after washing.   DO NOT use if you have an allergy to chlorhexidine/CHG or antibacterial soaps. If your skin becomes reddened or irritated, stop using the CHG and notify one of our RNs at [phone].   Please shower with the CHG soap starting 4 days before surgery using the following schedule:     Please keep in mind the following:  DO NOT shave, including legs and underarms, starting the day of your first shower.   You may shave your face at any point before/day of  surgery.  Place clean sheets on your bed the day you start using CHG soap. Use a clean washcloth (not used since being washed) for each shower. DO NOT sleep with pets once you start using the CHG.   CHG Shower Instructions:  If you choose to wash your hair and private area, wash first with your normal shampoo/soap.  After you use shampoo/soap, rinse your hair and body thoroughly to remove shampoo/soap residue.  Turn the water OFF and apply about 3 tablespoons (45 ml) of CHG soap to a CLEAN washcloth.  Apply CHG soap ONLY FROM YOUR NECK DOWN TO YOUR TOES (washing for 3-5 minutes)  DO NOT use CHG soap on face, private areas, open wounds, or sores.  Pay special attention to the area where your surgery is being performed.  If you are having back surgery, having someone wash your back for you may be helpful. Wait 2 minutes after CHG soap is applied, then you may rinse off the CHG soap.  Pat dry with a clean towel  Put on clean clothes/pajamas   If you choose to wear lotion, please use ONLY the CHG-compatible lotions on the back of this paper.     Additional instructions for the day of surgery: DO NOT APPLY any lotions, deodorants, cologne, or perfumes.   Put on clean/comfortable clothes.  Brush your teeth.  Ask your nurse before applying any prescription medications to the skin.   CHG Compatible Lotions   Aveeno Moisturizing lotion  Cetaphil Moisturizing Cream  Cetaphil Moisturizing Lotion  Clairol Herbal Essence Moisturizing Lotion, Dry Skin  Clairol Herbal Essence Moisturizing Lotion, Extra Dry Skin  Clairol Herbal Essence Moisturizing Lotion, Normal Skin  Curel Age Defying Therapeutic Moisturizing Lotion with Alpha Hydroxy  Curel Extreme Care Body Lotion  Curel Soothing Hands Moisturizing Hand Lotion  Curel Therapeutic Moisturizing Cream, Fragrance-Free  Curel Therapeutic Moisturizing Lotion, Fragrance-Free  Curel Therapeutic Moisturizing Lotion, Original Formula  Eucerin Daily  Replenishing Lotion  Eucerin Dry Skin Therapy Plus Alpha Hydroxy Crme  Eucerin Dry Skin Therapy Plus Alpha Hydroxy Lotion  Eucerin Original Crme  Eucerin Original Lotion  Eucerin Plus Crme Eucerin Plus Lotion  Eucerin TriLipid Replenishing Lotion  Keri Anti-Bacterial Hand Lotion  Keri Deep Conditioning Original Lotion Dry Skin Formula Softly Scented  Keri Deep Conditioning Original Lotion, Fragrance Free Sensitive Skin Formula  Keri Lotion Fast Absorbing Fragrance Free Sensitive Skin Formula  Keri Lotion Fast Absorbing Softly Scented Dry Skin Formula  Keri Original Lotion  Keri Skin Renewal Lotion Keri Silky Smooth Lotion  Keri Silky Smooth Sensitive Skin Lotion  Nivea Body Creamy Conditioning Oil  Nivea Body Extra Enriched Regulatory affairs officer  Nivea Crme  Nivea Skin Firming Lotion  NutraDerm 30 Skin Lotion  NutraDerm Skin Lotion  NutraDerm Therapeutic Skin Cream  NutraDerm Therapeutic Skin Lotion  ProShield Protective Hand Cream  Provon moisturizing lotion  Incentive Spirometer  An incentive spirometer is a tool that can help keep your lungs clear and active. This tool measures how well you are filling your lungs with each breath. Taking long deep breaths may help reverse or decrease the chance of developing breathing (pulmonary) problems (especially infection) following: A long period of time when you are unable to move or be active. BEFORE THE PROCEDURE  If the spirometer includes an indicator to show your best effort, your nurse or respiratory therapist will set it to a desired goal. If possible, sit up straight or lean slightly forward. Try not to slouch. Hold the incentive spirometer in an upright position. INSTRUCTIONS FOR USE  Sit on the edge of your bed if possible, or sit up as far as you can in bed or on a chair. Hold the incentive spirometer in an upright position. Breathe out normally. Place the mouthpiece in  your mouth and seal your lips tightly around it. Breathe in slowly and as deeply as possible, raising the piston or the ball toward the top of the column. Hold your breath for 3-5 seconds or for as long as possible. Allow the piston or ball to fall to the bottom of the column. Remove the mouthpiece from your mouth and breathe out normally. Rest for a few seconds and repeat Steps 1 through 7 at least 10 times every 1-2 hours when you are awake. Take your time and take a few normal breaths between deep breaths. The spirometer may include an indicator to show your best effort. Use the indicator as a goal to work toward during each repetition. After each set of 10 deep breaths, practice coughing to be sure your lungs are clear. If you have an incision (the cut made at the time of surgery), support your incision when coughing by placing a pillow or rolled up towels firmly against it. Once you are able to get out of bed, walk around indoors and cough well. You may stop using the incentive spirometer when instructed by your caregiver.  RISKS AND COMPLICATIONS Take your time so you do not get dizzy or light-headed. If you are in pain, you may need to take or ask for pain medication before doing incentive spirometry. It is harder to take a deep breath if you are having pain. AFTER USE Rest and breathe slowly and easily. It can be helpful to keep track of a log of your progress. Your caregiver can provide you with a simple table to help with this. If you are using the spirometer at home, follow these instructions: SEEK MEDICAL CARE IF:  You are having difficultly using the spirometer. You have trouble using the spirometer as often as instructed. Your pain medication is not giving enough relief while using the spirometer. You develop fever of 100.5 F (38.1 C) or higher. SEEK IMMEDIATE MEDICAL CARE IF:  You cough up bloody sputum that had not been present before. You develop fever of 102 F (38.9 C) or  greater. You develop worsening pain at or near the incision site. MAKE SURE YOU:  Understand these instructions. Will watch your condition. Will get help right away if you are not doing well or get worse. Document Released: 02/09/2007 Document Revised: 12/22/2011 Document Reviewed: 04/12/2007 [facility] Patient Information 2014 [place], [place].  ________________________________________________________________________ 

## 2024-01-12 ENCOUNTER — Encounter (HOSPITAL_COMMUNITY): Payer: Self-pay

## 2024-01-12 ENCOUNTER — Encounter: Payer: Self-pay | Admitting: Internal Medicine

## 2024-01-12 ENCOUNTER — Encounter (HOSPITAL_COMMUNITY)
Admission: RE | Admit: 2024-01-12 | Discharge: 2024-01-12 | Disposition: A | Discharge status: Home / Self Care | Source: Ambulatory Visit | Attending: Orthopedic Surgery | Admitting: Orthopedic Surgery

## 2024-01-12 ENCOUNTER — Other Ambulatory Visit: Payer: Self-pay

## 2024-01-12 VITALS — BP 151/81 | HR 71 | Temp 97.5°F | Ht 73.0 in | Wt 253.0 lb

## 2024-01-12 DIAGNOSIS — I509 Heart failure, unspecified: Secondary | ICD-10-CM | POA: Insufficient documentation

## 2024-01-12 DIAGNOSIS — M19011 Primary osteoarthritis, right shoulder: Secondary | ICD-10-CM | POA: Diagnosis not present

## 2024-01-12 DIAGNOSIS — I110 Hypertensive heart disease with heart failure: Secondary | ICD-10-CM | POA: Diagnosis not present

## 2024-01-12 DIAGNOSIS — Z01818 Encounter for other preprocedural examination: Secondary | ICD-10-CM

## 2024-01-12 DIAGNOSIS — I10 Essential (primary) hypertension: Secondary | ICD-10-CM

## 2024-01-12 DIAGNOSIS — I429 Cardiomyopathy, unspecified: Secondary | ICD-10-CM | POA: Diagnosis not present

## 2024-01-12 DIAGNOSIS — I482 Chronic atrial fibrillation, unspecified: Secondary | ICD-10-CM | POA: Diagnosis not present

## 2024-01-12 DIAGNOSIS — Z01812 Encounter for preprocedural laboratory examination: Secondary | ICD-10-CM | POA: Insufficient documentation

## 2024-01-12 LAB — SURGICAL PCR SCREEN
MRSA, PCR: NEGATIVE
Staphylococcus aureus: NEGATIVE

## 2024-01-12 LAB — CBC
HCT: 42 % (ref 39.0–52.0)
Hemoglobin: 14.4 g/dL (ref 13.0–17.0)
MCH: 32.4 pg (ref 26.0–34.0)
MCHC: 34.3 g/dL (ref 30.0–36.0)
MCV: 94.6 fL (ref 80.0–100.0)
Platelets: 238 10*3/uL (ref 150–400)
RBC: 4.44 MIL/uL (ref 4.22–5.81)
RDW: 13.2 % (ref 11.5–15.5)
WBC: 6.3 10*3/uL (ref 4.0–10.5)
nRBC: 0 % (ref 0.0–0.2)

## 2024-01-12 LAB — BASIC METABOLIC PANEL WITH GFR
Anion gap: 8 (ref 5–15)
BUN: 13 mg/dL (ref 8–23)
CO2: 26 mmol/L (ref 22–32)
Calcium: 9.6 mg/dL (ref 8.9–10.3)
Chloride: 104 mmol/L (ref 98–111)
Creatinine, Ser: 0.73 mg/dL (ref 0.61–1.24)
GFR, Estimated: >60 mL/min (ref >60)
Glucose, Bld: 125 mg/dL — ABNORMAL HIGH (ref 70–99)
Potassium: 4.1 mmol/L (ref 3.5–5.1)
Sodium: 138 mmol/L (ref 135–145)

## 2024-01-12 NOTE — Progress Notes (Signed)
 Received a call back from [place] with [employer], he said he will be at [facility] ( OR) on 01/22/24 at 5:15 AM.

## 2024-01-12 NOTE — Progress Notes (Signed)
 PERIOPERATIVE PRESCRIPTION FOR IMPLANTED CARDIAC DEVICE PROGRAMMING  Patient Information: Name:  [name] Page  DOB:  04-04-1946  MRN:  [id number]    Planned Procedure:  ARTHROPLASTY, SHOULDER, TOTAL, REVERSE - Right  Surgeon:  Dr. Beverely Low  Date of Procedure:  01/22/24  Cautery will be used.  Position during surgery:    Please send documentation back to:  [facility] (Fax # [phone])   Device Information:  Clinic EP Physician:  Sherryl Manges, MD   Device Type:  Pacemaker Manufacturer and Phone #:  [employer]: [phone] Pacemaker Dependent?:  Unknown Date of Last Device Check:  10/29/2023 Normal Device Function?:  Yes.    Electrophysiologist's Recommendations:  Have magnet available. Provide continuous ECG monitoring when magnet is used or reprogramming is to be performed.  Procedure will likely interfere with device function.  Device should be programmed:  Asynchronous pacing during procedure and returned to normal programming after procedure  Per Device Clinic Standing Orders, Lenor Coffin, RN  11:09 AM 01/12/2024

## 2024-01-12 NOTE — Progress Notes (Addendum)
 For Anesthesia: PCP - Geoffry Paradise, MD  Cardiologist - Duke Salvia, MD  Clearance: Neila Gear.: NP: 08/21/23 Bowel Prep reminder:  Chest x-ray -  EKG -  Stress Test -  ECHO - 04/30/21 Cardiac Cath -  Pacemaker/ICD device last checked:10/29/23 Pacemaker orders received:Yes Device Rep notified: Yes. Kerry Fort and [employer] via email.   Spinal Cord Stimulator:N/A  Sleep Study - Yes CPAP - Yes  Fasting Blood Sugar - N/A Checks Blood Sugar _____ times a day Date and result of last Hgb A1c-  Last dose of GLP1 agonist- N/A GLP1 instructions:   Last dose of SGLT-2 inhibitors- N/A SGLT-2 instructions:   Blood Thinner Instructions: Eliquis will be hold after: 01/18/24 Aspirin Instructions: Last Dose:  Activity level: Can go up a flight of stairs and activities of daily living without stopping and without chest pain and/or shortness of breath   Able to exercise without chest pain and/or shortness of breath  Anesthesia review: Hx: Afib,CHF,Cardiomyopathy,OSA(CPAP).  Patient denies shortness of breath, fever, cough and chest pain at PAT appointment   Patient verbalized understanding of instructions that were given to them at the PAT appointment. Patient was also instructed that they will need to review over the PAT instructions again at home before surgery.

## 2024-01-13 NOTE — Progress Notes (Signed)
 Anesthesia Chart Review   Case: [id number] Date/Time: 01/22/24 0715   Procedure: ARTHROPLASTY, SHOULDER, TOTAL, REVERSE (Right: Shoulder) - interscalene block   Anesthesia type: Choice   Pre-op diagnosis: Right shoulder rotatorr cuff arthropathy   Location: [facility] ROOM 06 / [facility]   Surgeons: Beverely Low, MD       DISCUSSION:78 y.o. never smoker with h/o HTN, sleep apnea, atrial fibrillation, NICM s/p [employer] Jude CRT-P (device orders in 01/12/2024 progress note, device rep contacted), right shoulder OA scheduled for above procedure 01/22/2024 with Dr. Beverely Low.   Pt seen by cardiology 08/20/2024. Per OV note, "-Patient's RCRI score is 6.6%   The patient affirms he has been doing well without any new cardiac symptoms. They are able to achieve 7 METS without cardiac limitations. Therefore, based on ACC/AHA guidelines, the patient would be at acceptable risk for the planned procedure without further cardiovascular testing. The patient was advised that if he develops new symptoms prior to surgery to contact our office to arrange for a follow-up visit, and he verbalized understanding.    The patient was advised that if he develops new symptoms prior to surgery to contact our office to arrange for a follow-up visit, and he verbalized understanding.   Per office protocol, patient can hold Eliquis for 3 days prior to procedure."  Pt reports last dose of Eliquis 01/18/24.  VS: BP (!) 151/81   Pulse 71   Temp (!) 36.4 C (Oral)   Ht 6\' 1"  (1.854 m)   Wt 114.8 kg   SpO2 99%   BMI 33.38 kg/m   PROVIDERS: Geoffry Paradise, MD is PCP   Cardiologist - Duke Salvia, MD  LABS: Labs reviewed: Acceptable for surgery. (all labs ordered are listed, but only abnormal results are displayed)  Labs Reviewed  BASIC METABOLIC PANEL WITH GFR - Abnormal; Notable for the following components:      Result Value   Glucose, Bld 125 (*)    All other components within normal limits  SURGICAL PCR SCREEN  CBC      IMAGES:   EKG:   CV: Echo 04/30/2021  1. Left ventricular ejection fraction, by estimation, is 50 to 55%. The  left ventricle has low normal function. There is mild left ventricular  hypertrophy. Left ventricular diastolic parameters are indeterminate.   2. Right ventricular systolic function is mildly reduced. The right  ventricular size is normal. There is normal pulmonary artery systolic  pressure. The estimated right ventricular systolic pressure is 22.4 mmHg.   3. Left atrial size was mildly dilated.   4. The mitral valve is normal in structure. No evidence of mitral valve  regurgitation.   5. The aortic valve is tricuspid. Aortic valve regurgitation is not  visualized. No aortic stenosis is present.   6. The inferior vena cava is dilated in size with >50% respiratory  variability, suggesting right atrial pressure of 8 mmHg.  Past Medical History:  Diagnosis Date   Anal fissure    Anal fistula    Arthritis    Atrial fibrillation (HCC)    Blood transfusion without reported diagnosis    Cardiomyopathy    Nonishemic, possibly tachycardia-mediated. 1997 LHC with no significant CAD. Myoview (7/09) showed EF 44% with possible mild inferior ischemia. Echo (9/10): EF 60%   Cataract    CHF (congestive heart failure) (HCC)    Chronic atrial fibrillation (HCC)    s/p AV nodal ablation and on Coumadin. Patient developed dyspnea/fatigue with RV  pacing and an  LV lead was placed   Colon polyp    hyperplastic and adenomatous   GERD (gastroesophageal reflux disease)    HTN (hypertension)    Hyperlipidemia    Hyperplastic colon polyp    and adenomatous   Osteoarthritis of right shoulder region    Pacemaker    St. Jude CRT-P device   Pseudogout    Sleep apnea    Upper GI bleed    With duodenal ulcer in 2005.  Patient was on ASA and coumadin at that time. ASA was stopped..    Past Surgical History:  Procedure Laterality Date   BIV PACEMAKER GENERATOR CHANGEOUT N/A  01/28/2017   Procedure: BiV Pacemaker Generator Changeout;  Surgeon: Duke Salvia, MD;  Location: [facility] INVASIVE CV LAB;  Service: Cardiovascular;  Laterality: N/A;   CIRCUMCISION N/A 02/07/2022   Procedure: CIRCUMCISION ADULT;  Surgeon: Jannifer Hick, MD;  Location: [facility]S;  Service: Urology;  Laterality: N/A;   PACEMAKER INSERTION     [employer] Jude's   SHOULDER SURGERY     Right   TONSILLECTOMY     TREATMENT FISTULA ANAL     WISDOM TOOTH EXTRACTION      MEDICATIONS:  carvedilol (COREG) 25 MG tablet   colchicine 0.6 MG tablet   ELIQUIS 5 MG TABS tablet   furosemide (LASIX) 40 MG tablet   hydrochlorothiazide 25 MG tablet   Multiple Vitamin (MULTIVITAMIN WITH MINERALS) TABS tablet   ramipril (ALTACE) 10 MG capsule   rosuvastatin (CRESTOR) 20 MG tablet   No current facility-administered medications for this encounter.    Jodell Cipro Ward, PA-C [facility] Pre-Surgical Testing [phone]

## 2024-01-21 NOTE — Anesthesia Preprocedure Evaluation (Addendum)
 Anesthesia Evaluation  Patient identified by MRN, date of birth, ID band Patient awake    Reviewed: Allergy & Precautions, NPO status , Patient's Chart, lab work & pertinent test results  Airway Mallampati: II  TM Distance: >3 FB Neck ROM: Full    Dental  (+) Chipped   Pulmonary sleep apnea and Continuous Positive Airway Pressure Ventilation    Pulmonary exam normal        Cardiovascular hypertension, Pt. on home beta blockers and Pt. on medications +CHF  Normal cardiovascular exam+ dysrhythmias Atrial Fibrillation + pacemaker   ECHO: 1. Left ventricular ejection fraction, by estimation, is 50 to 55%. The  left ventricle has low normal function. There is mild left ventricular  hypertrophy. Left ventricular diastolic parameters are indeterminate.   2. Right ventricular systolic function is mildly reduced. The right  ventricular size is normal. There is normal pulmonary artery systolic  pressure. The estimated right ventricular systolic pressure is 22.4 mmHg.   3. Left atrial size was mildly dilated.   4. The mitral valve is normal in structure. No evidence of mitral valve  regurgitation.   5. The aortic valve is tricuspid. Aortic valve regurgitation is not  visualized. No aortic stenosis is present.   6. The inferior vena cava is dilated in size with >50% respiratory  variability, suggesting right atrial pressure of 8 mmHg.     Neuro/Psych negative neurological ROS  negative psych ROS   GI/Hepatic negative GI ROS, Neg liver ROS,,,  Endo/Other  negative endocrine ROS    Renal/GU negative Renal ROS     Musculoskeletal  (+) Arthritis ,    Abdominal  (+) + obese  Peds  Hematology  (+) Blood dyscrasia (Eliquis)   Anesthesia Other Findings Right shoulder rotatorr cuff arthropath  Reproductive/Obstetrics                             Anesthesia Physical Anesthesia Plan  ASA: 3  Anesthesia  Plan: General and Regional   Post-op Pain Management: Regional block*   Induction: Intravenous  PONV Risk Score and Plan: 2 and Ondansetron, Dexamethasone and Treatment may vary due to age or medical condition  Airway Management Planned: Oral ETT  Additional Equipment:   Intra-op Plan:   Post-operative Plan: Extubation in OR  Informed Consent: I have reviewed the patients History and Physical, chart, labs and discussed the procedure including the risks, benefits and alternatives for the proposed anesthesia with the patient or authorized representative who has indicated his/her understanding and acceptance.     Dental advisory given  Plan Discussed with: CRNA  Anesthesia Plan Comments:        Anesthesia Quick Evaluation

## 2024-01-22 ENCOUNTER — Ambulatory Visit (HOSPITAL_COMMUNITY)

## 2024-01-22 ENCOUNTER — Ambulatory Visit (HOSPITAL_COMMUNITY): Payer: Self-pay

## 2024-01-22 ENCOUNTER — Encounter (HOSPITAL_COMMUNITY)
Admission: RE | Disposition: A | Discharge status: Home / Self Care | Payer: Self-pay | Source: Ambulatory Visit | Attending: Orthopedic Surgery

## 2024-01-22 ENCOUNTER — Other Ambulatory Visit: Payer: Self-pay

## 2024-01-22 ENCOUNTER — Ambulatory Visit (HOSPITAL_COMMUNITY): Payer: Self-pay | Admitting: Physician Assistant

## 2024-01-22 ENCOUNTER — Ambulatory Visit (HOSPITAL_COMMUNITY)
Admission: RE | Admit: 2024-01-22 | Discharge: 2024-01-22 | Disposition: A | Discharge status: Home / Self Care | Payer: PPO | Source: Ambulatory Visit | Attending: Orthopedic Surgery | Admitting: Orthopedic Surgery

## 2024-01-22 ENCOUNTER — Encounter (HOSPITAL_COMMUNITY): Payer: Self-pay | Admitting: Orthopedic Surgery

## 2024-01-22 DIAGNOSIS — D759 Disease of blood and blood-forming organs, unspecified: Secondary | ICD-10-CM | POA: Diagnosis not present

## 2024-01-22 DIAGNOSIS — I482 Chronic atrial fibrillation, unspecified: Secondary | ICD-10-CM | POA: Diagnosis not present

## 2024-01-22 DIAGNOSIS — M12811 Other specific arthropathies, not elsewhere classified, right shoulder: Secondary | ICD-10-CM | POA: Diagnosis not present

## 2024-01-22 DIAGNOSIS — Z7984 Long term (current) use of oral hypoglycemic drugs: Secondary | ICD-10-CM | POA: Diagnosis not present

## 2024-01-22 DIAGNOSIS — I110 Hypertensive heart disease with heart failure: Secondary | ICD-10-CM

## 2024-01-22 DIAGNOSIS — I509 Heart failure, unspecified: Secondary | ICD-10-CM

## 2024-01-22 DIAGNOSIS — I429 Cardiomyopathy, unspecified: Secondary | ICD-10-CM | POA: Diagnosis not present

## 2024-01-22 DIAGNOSIS — M75101 Unspecified rotator cuff tear or rupture of right shoulder, not specified as traumatic: Secondary | ICD-10-CM | POA: Insufficient documentation

## 2024-01-22 DIAGNOSIS — E669 Obesity, unspecified: Secondary | ICD-10-CM | POA: Diagnosis not present

## 2024-01-22 DIAGNOSIS — G4733 Obstructive sleep apnea (adult) (pediatric): Secondary | ICD-10-CM

## 2024-01-22 DIAGNOSIS — G4730 Sleep apnea, unspecified: Secondary | ICD-10-CM | POA: Insufficient documentation

## 2024-01-22 DIAGNOSIS — M19011 Primary osteoarthritis, right shoulder: Secondary | ICD-10-CM | POA: Diagnosis not present

## 2024-01-22 DIAGNOSIS — M75121 Complete rotator cuff tear or rupture of right shoulder, not specified as traumatic: Secondary | ICD-10-CM | POA: Diagnosis not present

## 2024-01-22 DIAGNOSIS — Z950 Presence of cardiac pacemaker: Secondary | ICD-10-CM | POA: Diagnosis not present

## 2024-01-22 DIAGNOSIS — Z7901 Long term (current) use of anticoagulants: Secondary | ICD-10-CM | POA: Diagnosis not present

## 2024-01-22 DIAGNOSIS — Z96611 Presence of right artificial shoulder joint: Secondary | ICD-10-CM | POA: Diagnosis not present

## 2024-01-22 DIAGNOSIS — Z471 Aftercare following joint replacement surgery: Secondary | ICD-10-CM | POA: Diagnosis not present

## 2024-01-22 DIAGNOSIS — Z6833 Body mass index (BMI) 33.0-33.9, adult: Secondary | ICD-10-CM | POA: Diagnosis not present

## 2024-01-22 DIAGNOSIS — Z79899 Other long term (current) drug therapy: Secondary | ICD-10-CM | POA: Insufficient documentation

## 2024-01-22 DIAGNOSIS — G8918 Other acute postprocedural pain: Secondary | ICD-10-CM | POA: Diagnosis not present

## 2024-01-22 HISTORY — PX: REVERSE SHOULDER ARTHROPLASTY: SHX5054

## 2024-01-22 SURGERY — ARTHROPLASTY, SHOULDER, TOTAL, REVERSE
Anesthesia: Regional | Site: Shoulder | Laterality: Right

## 2024-01-22 MED ORDER — ONDANSETRON HCL 4 MG PO TABS: 4.0000 mg | ORAL_TABLET | Freq: Four times a day (QID) | ORAL | Status: DC | PRN

## 2024-01-22 MED ORDER — CEFAZOLIN SODIUM-DEXTROSE 2-4 GM/100ML-% IV SOLN: 2.0000 g | Freq: Four times a day (QID) | INTRAVENOUS | Status: DC

## 2024-01-22 MED ORDER — BUPIVACAINE-EPINEPHRINE (PF) 0.25% -1:200000 IJ SOLN
INTRAMUSCULAR | Status: DC | PRN
  Administered 2024-01-22: 20 mL

## 2024-01-22 MED ORDER — FENTANYL CITRATE (PF) 100 MCG/2ML IJ SOLN
INTRAMUSCULAR | Status: AC
  Filled 2024-01-22: qty 2

## 2024-01-22 MED ORDER — ONDANSETRON HCL 4 MG/2ML IJ SOLN: 4.0000 mg | Freq: Four times a day (QID) | INTRAMUSCULAR | Status: DC | PRN

## 2024-01-22 MED ORDER — METHOCARBAMOL 1000 MG/10ML IJ SOLN: 500.0000 mg | Freq: Four times a day (QID) | INTRAMUSCULAR | Status: DC | PRN

## 2024-01-22 MED ORDER — 0.9 % SODIUM CHLORIDE (POUR BTL) OPTIME
TOPICAL | Status: DC | PRN
  Administered 2024-01-22: 1000 mL

## 2024-01-22 MED ORDER — ONDANSETRON HCL 4 MG/2ML IJ SOLN
INTRAMUSCULAR | Status: DC | PRN
  Administered 2024-01-22: 4 mg via INTRAVENOUS

## 2024-01-22 MED ORDER — BUPIVACAINE LIPOSOME 1.3 % IJ SUSP
INTRAMUSCULAR | Status: DC | PRN
  Administered 2024-01-22: 10 mL via PERINEURAL

## 2024-01-22 MED ORDER — ROCURONIUM BROMIDE 100 MG/10ML IV SOLN
INTRAVENOUS | Status: DC | PRN
  Administered 2024-01-22: 40 mg via INTRAVENOUS

## 2024-01-22 MED ORDER — LIDOCAINE HCL (PF) 2 % IJ SOLN
INTRAMUSCULAR | Status: AC
  Filled 2024-01-22: qty 5

## 2024-01-22 MED ORDER — CEFAZOLIN SODIUM-DEXTROSE 2-4 GM/100ML-% IV SOLN
2.0000 g | INTRAVENOUS | Status: AC
  Administered 2024-01-22: 2 g via INTRAVENOUS
  Filled 2024-01-22: qty 100

## 2024-01-22 MED ORDER — DEXMEDETOMIDINE HCL IN NACL 80 MCG/20ML IV SOLN
INTRAVENOUS | Status: DC | PRN
  Administered 2024-01-22: 8 ug via INTRAVENOUS
  Administered 2024-01-22 (×2): 4 ug via INTRAVENOUS

## 2024-01-22 MED ORDER — HYDROMORPHONE HCL 1 MG/ML IJ SOLN: 0.5000 mg | INTRAMUSCULAR | Status: DC | PRN

## 2024-01-22 MED ORDER — OXYCODONE HCL 5 MG PO TABS: 5.0000 mg | ORAL_TABLET | ORAL | Status: DC | PRN

## 2024-01-22 MED ORDER — CHLORHEXIDINE GLUCONATE 0.12 % MT SOLN
15.0000 mL | Freq: Once | OROMUCOSAL | Status: AC
  Administered 2024-01-22: 15 mL via OROMUCOSAL

## 2024-01-22 MED ORDER — PROPOFOL 500 MG/50ML IV EMUL
INTRAVENOUS | Status: DC | PRN
  Administered 2024-01-22: 100 mg via INTRAVENOUS

## 2024-01-22 MED ORDER — DEXAMETHASONE SODIUM PHOSPHATE 10 MG/ML IJ SOLN
INTRAMUSCULAR | Status: DC | PRN
  Administered 2024-01-22: 10 mg via INTRAVENOUS

## 2024-01-22 MED ORDER — TRANEXAMIC ACID-NACL 1000-0.7 MG/100ML-% IV SOLN: 1000.0000 mg | Freq: Once | INTRAVENOUS | Status: DC

## 2024-01-22 MED ORDER — TRANEXAMIC ACID-NACL 1000-0.7 MG/100ML-% IV SOLN
1000.0000 mg | INTRAVENOUS | Status: AC
  Administered 2024-01-22: 1000 mg via INTRAVENOUS
  Filled 2024-01-22: qty 100

## 2024-01-22 MED ORDER — BUPIVACAINE HCL (PF) 0.5 % IJ SOLN
INTRAMUSCULAR | Status: DC | PRN
  Administered 2024-01-22: 15 mL via PERINEURAL

## 2024-01-22 MED ORDER — ONDANSETRON HCL 4 MG/2ML IJ SOLN
INTRAMUSCULAR | Status: AC
  Filled 2024-01-22: qty 2

## 2024-01-22 MED ORDER — ROCURONIUM BROMIDE 10 MG/ML (PF) SYRINGE
PREFILLED_SYRINGE | INTRAVENOUS | Status: AC
  Filled 2024-01-22: qty 10

## 2024-01-22 MED ORDER — ONDANSETRON HCL 4 MG/2ML IJ SOLN: 4.0000 mg | Freq: Once | INTRAMUSCULAR | Status: DC | PRN

## 2024-01-22 MED ORDER — SUGAMMADEX SODIUM 200 MG/2ML IV SOLN
INTRAVENOUS | Status: DC | PRN
  Administered 2024-01-22 (×4): 50 mg via INTRAVENOUS

## 2024-01-22 MED ORDER — LIDOCAINE HCL (CARDIAC) PF 100 MG/5ML IV SOSY
PREFILLED_SYRINGE | INTRAVENOUS | Status: DC | PRN
  Administered 2024-01-22: 50 mg via INTRAVENOUS

## 2024-01-22 MED ORDER — LACTATED RINGERS IV SOLN
INTRAVENOUS | Status: DC
Start: 2024-01-22 — End: 2024-01-22

## 2024-01-22 MED ORDER — STERILE WATER FOR IRRIGATION IR SOLN
Status: DC | PRN
  Administered 2024-01-22: 1000 mL

## 2024-01-22 MED ORDER — DEXMEDETOMIDINE HCL IN NACL 80 MCG/20ML IV SOLN
INTRAVENOUS | Status: AC
  Filled 2024-01-22: qty 20

## 2024-01-22 MED ORDER — METOCLOPRAMIDE HCL 5 MG/ML IJ SOLN: 5.0000 mg | Freq: Three times a day (TID) | INTRAMUSCULAR | Status: DC | PRN

## 2024-01-22 MED ORDER — ORAL CARE MOUTH RINSE: 15.0000 mL | Freq: Once | OROMUCOSAL | Status: AC

## 2024-01-22 MED ORDER — OXYCODONE-ACETAMINOPHEN 5-325 MG PO TABS: 1.0000 | ORAL_TABLET | ORAL | 0 refills | Status: DC | PRN

## 2024-01-22 MED ORDER — ONDANSETRON HCL 4 MG PO TABS: 4.0000 mg | ORAL_TABLET | Freq: Three times a day (TID) | ORAL | 1 refills | Status: DC | PRN

## 2024-01-22 MED ORDER — AMISULPRIDE (ANTIEMETIC) 5 MG/2ML IV SOLN: 10.0000 mg | Freq: Once | INTRAVENOUS | Status: DC | PRN

## 2024-01-22 MED ORDER — FENTANYL CITRATE PF 50 MCG/ML IJ SOSY: 25.0000 ug | PREFILLED_SYRINGE | INTRAMUSCULAR | Status: DC | PRN

## 2024-01-22 MED ORDER — ACETAMINOPHEN 500 MG PO TABS
1000.0000 mg | ORAL_TABLET | Freq: Once | ORAL | Status: AC
  Administered 2024-01-22: 1000 mg via ORAL
  Filled 2024-01-22: qty 2

## 2024-01-22 MED ORDER — DEXAMETHASONE SODIUM PHOSPHATE 10 MG/ML IJ SOLN
INTRAMUSCULAR | Status: AC
  Filled 2024-01-22: qty 1

## 2024-01-22 MED ORDER — FENTANYL CITRATE (PF) 100 MCG/2ML IJ SOLN
INTRAMUSCULAR | Status: DC | PRN
  Administered 2024-01-22 (×2): 50 ug via INTRAVENOUS

## 2024-01-22 MED ORDER — PROPOFOL 10 MG/ML IV BOLUS
INTRAVENOUS | Status: AC
  Filled 2024-01-22: qty 20

## 2024-01-22 MED ORDER — METHOCARBAMOL 500 MG PO TABS: 500.0000 mg | ORAL_TABLET | Freq: Four times a day (QID) | ORAL | Status: DC | PRN

## 2024-01-22 MED ORDER — BUPIVACAINE-EPINEPHRINE (PF) 0.25% -1:200000 IJ SOLN
INTRAMUSCULAR | Status: AC
  Filled 2024-01-22: qty 30

## 2024-01-22 MED ORDER — METOCLOPRAMIDE HCL 5 MG PO TABS: 5.0000 mg | ORAL_TABLET | Freq: Three times a day (TID) | ORAL | Status: DC | PRN

## 2024-01-22 SURGICAL SUPPLY — 63 items
BAG COUNTER SPONGE SURGICOUNT (BAG) IMPLANT
BAG ZIPLOCK 12X15 (MISCELLANEOUS) IMPLANT
BIT DRILL 1.6MX128 (BIT) IMPLANT
BIT DRILL 170X2.5X (BIT) IMPLANT
BIT DRL 170X2.5X (BIT) ×1 IMPLANT
BLADE SAG 18X100X1.27 (BLADE) ×1 IMPLANT
CLSR STERI-STRIP ANTIMIC 1/2X4 (GAUZE/BANDAGES/DRESSINGS) IMPLANT
COVER BACK TABLE 60X90IN (DRAPES) ×1 IMPLANT
COVER SURGICAL LIGHT HANDLE (MISCELLANEOUS) ×1 IMPLANT
CUP HUMERAL 42 PLUS 3 (Orthopedic Implant) IMPLANT
DRAPE INCISE IOBAN 66X45 STRL (DRAPES) ×1 IMPLANT
DRAPE SHEET LG 3/4 BI-LAMINATE (DRAPES) ×1 IMPLANT
DRAPE SURG ORHT 6 SPLT 77X108 (DRAPES) ×2 IMPLANT
DRAPE TOP 10253 STERILE (DRAPES) ×1 IMPLANT
DRAPE U-SHAPE 47X51 STRL (DRAPES) ×1 IMPLANT
DRSG ADAPTIC 3X8 NADH LF (GAUZE/BANDAGES/DRESSINGS) ×1 IMPLANT
DRSG AQUACEL AG ADV 3.5X10 (GAUZE/BANDAGES/DRESSINGS) ×1 IMPLANT
DURAPREP 26ML APPLICATOR (WOUND CARE) ×1 IMPLANT
ELECT BLADE TIP CTD 4 INCH (ELECTRODE) ×1 IMPLANT
ELECT NDL TIP 2.8 STRL (NEEDLE) ×1 IMPLANT
ELECT NEEDLE TIP 2.8 STRL (NEEDLE) ×1 IMPLANT
ELECT PENCIL ROCKER SW 15FT (MISCELLANEOUS) ×1 IMPLANT
ELECT REM PT RETURN 15FT ADLT (MISCELLANEOUS) ×1 IMPLANT
EPIPHYSI RIGHT SZ 2 (Shoulder) ×1 IMPLANT
EPIPHYSIS RIGHT SZ 2 (Shoulder) IMPLANT
FACESHIELD WRAPAROUND (MASK) ×1 IMPLANT
FACESHIELD WRAPAROUND OR TEAM (MASK) ×1 IMPLANT
GAUZE PAD ABD 8X10 STRL (GAUZE/BANDAGES/DRESSINGS) ×1 IMPLANT
GAUZE SPONGE 4X4 12PLY STRL (GAUZE/BANDAGES/DRESSINGS) ×1 IMPLANT
GLENOSPHERE LATRLZD 42 SHLDR (Screw) IMPLANT
GLOVE BIOGEL PI IND STRL 7.5 (GLOVE) ×1 IMPLANT
GLOVE BIOGEL PI IND STRL 8.5 (GLOVE) ×1 IMPLANT
GLOVE ORTHO TXT STRL SZ7.5 (GLOVE) ×1 IMPLANT
GLOVE SURG ORTHO 8.5 STRL (GLOVE) ×1 IMPLANT
GOWN STRL REUS W/ TWL XL LVL3 (GOWN DISPOSABLE) ×2 IMPLANT
KIT BASIN OR (CUSTOM PROCEDURE TRAY) ×1 IMPLANT
KIT TURNOVER KIT A (KITS) ×1 IMPLANT
MANIFOLD NEPTUNE II (INSTRUMENTS) ×1 IMPLANT
METAGLENE DELTA EXTEND (Trauma) IMPLANT
METAGLENE DXTEND (Trauma) ×1 IMPLANT
MODULAR STEM 14 (Trauma) ×1 IMPLANT
NDL MAYO CATGUT SZ4 TPR NDL (NEEDLE) IMPLANT
NEEDLE MAYO CATGUT SZ4 (NEEDLE) IMPLANT
NS IRRIG 1000ML POUR BTL (IV SOLUTION) ×1 IMPLANT
PACK SHOULDER (CUSTOM PROCEDURE TRAY) ×1 IMPLANT
PIN GUIDE 1.2 (PIN) IMPLANT
PIN GUIDE GLENOPHERE 1.5MX300M (PIN) IMPLANT
PIN METAGLENE 2.5 (PIN) IMPLANT
RESTRAINT HEAD UNIVERSAL NS (MISCELLANEOUS) ×1 IMPLANT
SCREW BN 18X4.5XSTRL SHLDR (Screw) IMPLANT
SCREW LOCK DELTA XTEND 4.5X30 (Screw) IMPLANT
SLING ARM FOAM STRAP LRG (SOFTGOODS) IMPLANT
SPIKE FLUID TRANSFER (MISCELLANEOUS) ×1 IMPLANT
STEM MODULAR 14 (Trauma) IMPLANT
STRIP CLOSURE SKIN 1/2X4 (GAUZE/BANDAGES/DRESSINGS) ×1 IMPLANT
SUT FIBERWIRE #2 38 T-5 BLUE (SUTURE) ×2 IMPLANT
SUT MNCRL AB 4-0 PS2 18 (SUTURE) ×1 IMPLANT
SUT VIC AB 0 CT1 36 (SUTURE) ×1 IMPLANT
SUT VIC AB 0 CT2 27 (SUTURE) ×1 IMPLANT
SUT VIC AB 2-0 CT1 TAPERPNT 27 (SUTURE) ×1 IMPLANT
SUTURE FIBERWR #2 38 T-5 BLUE (SUTURE) ×2 IMPLANT
TOWEL GREEN STERILE FF (TOWEL DISPOSABLE) ×1 IMPLANT
TOWEL OR 17X26 10 PK STRL BLUE (TOWEL DISPOSABLE) ×1 IMPLANT

## 2024-01-22 NOTE — Anesthesia Procedure Notes (Signed)
 Procedure Name: Intubation Date/Time: 01/22/2024 7:44 AM  Performed by: Lezlie Lye, CRNAPre-anesthesia Checklist: Patient identified, Emergency Drugs available, Suction available and Patient being monitored Patient Re-evaluated:Patient Re-evaluated prior to induction Oxygen Delivery Method: Circle system utilized Preoxygenation: Pre-oxygenation with 100% oxygen Induction Type: IV induction Ventilation: Mask ventilation without difficulty and Oral airway inserted - appropriate to patient size Laryngoscope Size: Miller and 3 Grade View: Grade II Tube type: Oral Tube size: 7.5 mm Number of attempts: 1 Airway Equipment and Method: Stylet Placement Confirmation: ETT inserted through vocal cords under direct vision, positive ETCO2 and breath sounds checked- equal and bilateral Secured at: 24 cm Tube secured with: Tape Dental Injury: Teeth and Oropharynx as per pre-operative assessment

## 2024-01-22 NOTE — Progress Notes (Signed)
 5:52 AM [employer] contacted, to page local rep to check on time of arrival   5:59 AM [employer] rep called back to let us know that rep Joni Reining will be here at 7 am.  I notified Dr Bradley Ferris that rep would be present at 0700

## 2024-01-22 NOTE — Interval H&P Note (Signed)
 History and Physical Interval Note:  01/22/2024 7:26 AM  [name] Page  has presented today for surgery, with the diagnosis of Right shoulder rotatorr cuff arthropathy.  The various methods of treatment have been discussed with the patient and family. After consideration of risks, benefits and other options for treatment, the patient has consented to  Procedure(s) with comments: ARTHROPLASTY, SHOULDER, TOTAL, REVERSE (Right) - interscalene block as a surgical intervention.  The patient's history has been reviewed, patient examined, no change in status, stable for surgery.  I have reviewed the patient's chart and labs.  Questions were answered to the patient's satisfaction.     Verlee Rossetti

## 2024-01-22 NOTE — Brief Op Note (Signed)
 01/22/2024  10:21 AM  PATIENT:  [name] Page  78 y.o. male  PRE-OPERATIVE DIAGNOSIS:  Right shoulder rotator cuff tear arthropathy  POST-OPERATIVE DIAGNOSIS:  Right shoulder rotatorr cuff tear arthropathy  PROCEDURE:  Procedure(s) with comments: ARTHROPLASTY, SHOULDER, TOTAL, REVERSE (Right) - interscalene block DePuy Delta Extend with no subscap repair  SURGEON:  Surgeons and Role:    Beverely Low, MD - Primary  PHYSICIAN ASSISTANT:   ASSISTANTS: Thea Gist, PA-C   ANESTHESIA:   regional and general  EBL:  200 mL   BLOOD ADMINISTERED:none  DRAINS: none   LOCAL MEDICATIONS USED:  MARCAINE     SPECIMEN:  No Specimen  DISPOSITION OF SPECIMEN:  N/A  COUNTS:  YES  TOURNIQUET:  * No tourniquets in log *  DICTATION: .Other Dictation: Dictation Number [id number]  PLAN OF CARE: Discharge to home after PACU  PATIENT DISPOSITION:  PACU - hemodynamically stable.   Delay start of Pharmacological VTE agent (>24hrs) due to surgical blood loss or risk of bleeding: not applicable

## 2024-01-22 NOTE — Discharge Instructions (Signed)
 Ice to the shoulder constantly.  Keep the incision covered and clean and dry for one week, then ok to get it wet in the shower. Change to the Aquacel bandage on Monday, leave on until Friday, then remove it  Do exercise as instructed several times per day.  DO NOT reach behind your back or push up out of a chair with the operative arm.  Use a sling while you are up and around for comfort, may remove while seated.  Keep pillow propped behind the operative elbow.  Follow up with Dr Ranell Patrick in two weeks in the office, call [phone] for appt  Please call Dr Ranell Patrick (cell) [phone] with any questions or concerns

## 2024-01-22 NOTE — Op Note (Signed)
 [name] Page, Page MEDICAL RECORD NO: [id number] ACCOUNT NO: [id number] DATE OF BIRTH: 24-Mar-1946 FACILITY: [facility] LOCATION: [facility] PHYSICIAN: Almedia Balls. Ranell Patrick, MD  Operative Report   DATE OF PROCEDURE: 01/22/2024  PREOPERATIVE DIAGNOSIS:  Right shoulder rotator cuff tear arthropathy.  POSTOPERATIVE DIAGNOSIS:  Right shoulder rotator cuff tear arthropathy.  PROCEDURE PERFORMED:  Right reverse total shoulder arthroplasty using DePuy Delta Xtend prosthesis.  ATTENDING SURGEON:  Almedia Balls. Ranell Patrick, MD  ASSISTANT:  Konrad Felix Dixon, [place], who was scrubbed during the entire procedure, and necessary for satisfactory completion of surgery.  ANESTHESIA:  General anesthesia was used plus interscalene block.  ESTIMATED BLOOD LOSS:  200 mL.  FLUID REPLACEMENT:  1000 mL crystalloid.  COUNTS:  Correct.  COMPLICATIONS:  There were no complications.  ANTIBIOTICS:  Perioperative antibiotics were given.  INDICATIONS:  The patient is a 78 year old male who presents with a history of worsening right shoulder pain due to end-stage rotator cuff tear arthropathy.  The patient had a history of remote right shoulder surgery and has had progressive pain and  functional loss despite conservative management.  We discussed options with the patient, recommending reverse total shoulder arthroplasty to eliminate pain and restore function.  Informed consent obtained.  DESCRIPTION OF PROCEDURE:  After an adequate level of general anesthesia was achieved plus interscalene block, the patient was positioned in modified beach chair position.  The right shoulder was correctly identified, and a sterile prep and drape  performed.  We then performed a timeout verifying correct patient and correct site.  Next, we entered the shoulder using the patient's prior anterior deltopectoral incision starting at the coracoid process and then extending down to the anterior humerus.   Dissection was carried down through  subcutaneous tissues using Bovie electrocautery.  The cephalic vein was identified and taken laterally with the deltoid.  The pectoralis was taken medially.  The conjoined tendon was identified and retracted  medially.  A deep retractor was placed.  We tenodesed the biceps in situ with 0 Vicryl figure-of-eight suture x2.  We then released the subscapularis remnant off the lesser tuberosity and tagged for protection of the axillary nerve.  We released the  inferior capsule.  Extensive osteophytes were noted inferiorly on the humerus.  We were careful to protect the axillary nerve.  Next, we extended the shoulder and delivered the humeral head out of the wound, which was devoid of cartilage.  We entered the  proximal humerus with a 6-mm reamer, reaming up to a size 14.  We then placed a 14-mm T-handle guide and resected the head at 20 degrees of retroversion with the oscillating saw.  We removed inferior osteophytes with a rongeur, getting back to native  humerus.  We then went to the glenoid side.  We placed our deep retractors.  We dissected carefully around, removing the biceps stump, the capsule, and then large osteophytes that were basically surrounding the entire shoulder superiorly and inferiorly.   We removed at least 1 to 1.5 cm of inferior osteophytes.  Trying to get back to the native glenoid neck, we got fairly close, within 4-5 mm of that, and felt like we were just taking enough bone inferiorly that we did not want to continue taking bone.   At this point, we placed our guide pin from our Metaglene reamer and we reamed to subchondral bone.  We had a good preparation for that.  We then did our peripheral T-handle reamer and then drilled out our central peg hole.  We  impacted the HA-coated  press-fit baseplate into position.  We placed 30 screw superiorly and then 18 screw all around.  We could not get a longer screw inferiorly due to the inferior osteophyte being our bony support inferiorly.  Once  we had the baseplate secured, we selected  the 42 + 0 eccentric and then initially dialed the eccentricity inferiorly, but due to how tight the patient's shoulder was with trialing, once we got the humerus in, we went ahead and dialed it superiorly and locked that in.  We had good coverage  inferiorly with the glenosphere, so we did not feel like we needed that eccentricity distalizing the patient anymore.  We locked that into the baseplate.  Next, we then went ahead and went to the humeral side.  We reamed for the two right metaphysis.  We  then trialed with the 14 stem and the two right metaphysis set in the 0 setting and trialed in 20 degrees of retroversion.  We selected a 42+3 poly, reduced the shoulder, and we were happy with our soft tissue tension and balancing and stability.  We  removed all trial components from the humeral side, irrigated thoroughly, selected the real HA-coated 14 stem and the two right metaphysis.  We set that on a 0 setting and secured it with a screwdriver.  We then impacted, using a bone graft from the  humeral head with impaction grafting technique, and impacted the stem in 20 degrees of retroversion with good stability.  We selected the real 42 +3 poly, placed that in the humeral tray, impacted that, and reduced the shoulder.  Again, nice soft tissue  tension and balancing, conjoined appropriate attention.  No gapping with inferior pole or external rotation.  We irrigated thoroughly.  We then went ahead and removed the subscapularis remnant.  I then closed the deltopectoral interval with 0 Vicryl  suture followed by 2-0 Vicryl subcutaneous closure and staples for skin.  A sterile dressing was applied followed by a shoulder sling.  The patient was transported to the recovery room in stable condition.   PUS D: 01/22/2024 10:32:09 am T: 01/22/2024 11:00:00 am  JOB: [id number]/ [id number]

## 2024-01-22 NOTE — Anesthesia Procedure Notes (Signed)
 Anesthesia Regional Block: Interscalene brachial plexus block   Pre-Anesthetic Checklist: , timeout performed,  Correct Patient, Correct Site, Correct Laterality,  Correct Procedure, Correct Position, site marked,  Risks and benefits discussed,  Surgical consent,  Pre-op evaluation,  At surgeon's request and post-op pain management  Laterality: Right  Prep: chloraprep       Needles:  Injection technique: Single-shot  Needle Type: Echogenic Stimulator Needle     Needle Length: 9cm  Needle Gauge: 21     Additional Needles:   Procedures:,,,, ultrasound used (permanent image in chart),,    Narrative:  Start time: 01/22/2024 7:15 AM End time: 01/22/2024 7:25 AM Injection made incrementally with aspirations every 5 mL.  Performed by: Personally  Anesthesiologist: Leonides Grills, MD  Additional Notes: Functioning IV was confirmed and monitors were applied.  A timeout was performed. Sterile prep, hand hygiene and sterile gloves were used. A 90mm 21ga Arrow echogenic stimulator needle was used. Negative aspiration and negative test dose prior to incremental administration of local anesthetic. The patient tolerated the procedure well.  Ultrasound guidance: relevent anatomy identified, needle position confirmed, local anesthetic spread visualized around nerve(s), vascular puncture avoided.  Image printed for medical record.

## 2024-01-22 NOTE — Transfer of Care (Signed)
 Immediate Anesthesia Transfer of Care Note  Patient: [name] Page  Procedure(s) Performed: ARTHROPLASTY, SHOULDER, TOTAL, REVERSE (Right: Shoulder)  Patient Location: PACU  Anesthesia Type:General and Regional  Level of Consciousness: awake and patient cooperative  Airway & Oxygen Therapy: Patient Spontanous Breathing and Patient connected to face mask oxygen  Post-op Assessment: Report given to RN and Post -op Vital signs reviewed and stable  Post vital signs: Reviewed and stable  Last Vitals:  Vitals Value Taken Time  BP 155/90 01/22/24 1019  Temp 97   Pulse 91 01/22/24 1023  Resp 22 01/22/24 1023  SpO2 96 % 01/22/24 1023  Vitals shown include unfiled device data.  Last Pain:  Vitals:   01/22/24 0626  TempSrc:   PainSc: 0-No pain         Complications: No notable events documented.

## 2024-01-22 NOTE — Evaluation (Signed)
 Occupational Therapy Evaluation Patient Details Name: [name] Page MRN: [id number] DOB: 25-Sep-1946 Today's Date: 01/22/2024   History of Present Illness   Damain Broadus is a 78 y.o male s/p R Reverse TSA. PMH: OA, Afib, cardiomyopathy, cataract, GERD, HTN, PM     Clinical Impressions PTA pt lives with wife and was independent prior to surgery this am. Home is a cottage at [employer].  Education completed regarding compensatory strategies for ADL tasks and functional mobility, management of sling, R ROM per specified parameters in the order set as indicated below, positioning of operative arm in sitting and supine and edema control, including use of regular ice packs. Caregiver present for education, written handouts provided and reviewed using Teach Back and pt/caregiver verbalized/demonstrated understanding. Due to the below listed deficits, pt requires minimal assistance with ADL tasks and was independent with basic functional mobility. Caregiver will be able to provide necessary level of assistance at discharge. Pt to follow up with MD to progress rehab of the operative shoulder. Wife was able to teach back all of the above.       If plan is discharge home, recommend the following:   A little help with walking and/or transfers     Functional Status Assessment   Patient has had a recent decline in their functional status and demonstrates the ability to make significant improvements in function in a reasonable and predictable amount of time.     Equipment Recommendations   None recommended by OT      Precautions/Restrictions   Precautions Precautions: Shoulder No shoulder ROM allowed; elbow/wrist/hand AROM only. Required Braces or Orthoses: Sling Restrictions Weight Bearing Restrictions Per Provider Order: Yes RUE Weight Bearing Per Provider Order: Non weight bearing     Mobility Bed Mobility Overal bed mobility:  (patient up in recliner)                   Transfers Overall transfer level: Independent                        Balance Overall balance assessment: No apparent balance deficits (not formally assessed)                                         ADL either performed or assessed with clinical judgement   ADL Overall ADL's : Needs assistance/impaired  Per orders, R shoulder parameters as follows for ADL tasks: No shoulder ROM; elbow/wrist/hand ROM only. While moving within specified parameters, pt/caregiver instructed on bathing and how to donn/doff shirt, placing operative arm through sleeve first when donning and off last when doffing.Pt/caregiver educated on compensatory strategies for LB ADL and strategies to reduce risk of falls.  Pt/caregiver educated on donning/doffing sling and to wear the sling at all times with the exception of ADL, and to loosen the neck strap of the sling when the operative arm is in a supported position when sitting. In sitting or supine, pt instructed to have a pillow behind and under their operative arm to provide support. If assist needed with ambulation, caregiver educated on the importance of walking on pt's non-operative side.  Education regarding use of regular ice packs completed, including the importance of using a barrier on the shoulder prior to positioning the pack. Pt/caregiver verbalized/demonstrated understanding. Teach Back used while caregiver assisted with dressing pt and positioning the ice pack to facilitate DC. Patient  was able to stand to void in urinal prior to session ending.                                             Vision Baseline Vision/History: 0 No visual deficits Ability to See in Adequate Light: 0 Adequate Patient Visual Report: No change from baseline Vision Assessment?: No apparent visual deficits     Perception Perception: Within Functional Limits       Praxis Praxis: WFL       Pertinent Vitals/Pain Pain Assessment Pain  Assessment: No/denies pain     Extremity/Trunk Assessment Upper Extremity Assessment Upper Extremity Assessment: Right hand dominant;RUE deficits/detail RUE Deficits / Details: R post op shoulder n block still active with elbow, wrist, hand AAROM WFLs   Lower Extremity Assessment Lower Extremity Assessment: Overall WFL for tasks assessed   Cervical / Trunk Assessment Cervical / Trunk Assessment: Normal   Communication Communication Communication: No apparent difficulties   Cognition Arousal: Alert Behavior During Therapy: WFL for tasks assessed/performed Cognition: No apparent impairments                               Following commands: Intact       Cueing  General Comments   Cueing Techniques: Verbal cues  post op R shoulder dressing intact, B knee hi TEDS in place for + 1 edema patient reports as baseline   Exercises Exercises: Shoulder Shoulder Exercises Elbow Flexion: 10 reps Elbow Extension: 10 reps Wrist Flexion: 10 reps Wrist Extension: 10 reps Digit Composite Flexion: 10 reps Composite Extension: 10 reps   Shoulder Instructions Shoulder Instructions Donning/doffing shirt without moving shoulder: Caregiver independent with task;Patient able to independently direct caregiver Method for sponge bathing under operated UE: Caregiver independent with task;Patient able to independently direct caregiver Donning/doffing sling/immobilizer: Caregiver independent with task;Patient able to independently direct caregiver Correct positioning of sling/immobilizer: Caregiver independent with task;Patient able to independently direct caregiver ROM for elbow, wrist and digits of operated UE: Caregiver independent with task;Patient able to independently direct caregiver Sling wearing schedule (on at all times/off for ADL's): Caregiver independent with task;Patient able to independently direct caregiver Proper positioning of operated UE when showering: Caregiver  independent with task;Patient able to independently direct caregiver Positioning of UE while sleeping: Caregiver independent with task;Patient able to independently direct caregiver    Home Living Family/patient expects to be discharged to:: Private residence Living Arrangements: Spouse/significant other   Type of Home: House Home Access: Level entry     Home Layout: One level     Bathroom Shower/Tub: Walk-in shower;Door   [employer] Toilet: Handicapped height Bathroom Accessibility: Yes How Accessible: Accessible via wheelchair;Accessible via walker Home Equipment: Cane - single point;Grab bars - toilet;Grab bars - tub/shower;Hand held shower head;Adaptive equipment;Lift chair Adaptive Equipment: Reacher        Prior Functioning/Environment Prior Level of Function : Independent/Modified Independent                     AM-PAC OT "6 Clicks" Daily Activity     Outcome Measure Help from another person eating meals?: None Help from another person taking care of personal grooming?: A Little Help from another person toileting, which includes using toliet, bedpan, or urinal?: A Little Help from another person bathing (including washing, rinsing, drying)?: A Little Help from  another person to put on and taking off regular upper body clothing?: A Little Help from another person to put on and taking off regular lower body clothing?: A Little 6 Click Score: 19   End of Session Equipment Utilized During Treatment: Gait belt Nurse Communication: Precautions;Mobility status  Activity Tolerance: Patient tolerated treatment well Patient left: in chair;with call bell/phone within reach;with family/visitor present                   Time: 1610-9604 OT Time Calculation (min): 36 min Charges:  OT General Charges $OT Visit: 1 Visit OT Evaluation $OT Eval Low Complexity: 1 Low OT Treatments $Self Care/Home Management : 8-22 mins  Raylee Strehl OT/L Acute Rehabilitation Department   [phone]  01/22/2024, 1:30 PM

## 2024-01-25 ENCOUNTER — Encounter (HOSPITAL_COMMUNITY): Payer: Self-pay | Admitting: Orthopedic Surgery

## 2024-01-25 NOTE — Anesthesia Postprocedure Evaluation (Signed)
 Anesthesia Post Note  Patient: [name] Page  Procedure(s) Performed: ARTHROPLASTY, SHOULDER, TOTAL, REVERSE (Right: Shoulder)     Patient location during evaluation: PACU Anesthesia Type: Regional and General Level of consciousness: awake Pain management: pain level controlled Vital Signs Assessment: post-procedure vital signs reviewed and stable Respiratory status: spontaneous breathing, nonlabored ventilation and respiratory function stable Cardiovascular status: blood pressure returned to baseline and stable Postop Assessment: no apparent nausea or vomiting Anesthetic complications: no   No notable events documented.  Last Vitals:  Vitals:   01/22/24 1145 01/22/24 1330  BP: (!) 156/80 (!) 153/84  Pulse: 76 71  Resp: (!) 24   Temp:  36.4 C  SpO2: 92% 96%    Last Pain:  Vitals:   01/22/24 1330  TempSrc:   PainSc: 0-No pain                 Lane Kjos P Heylee Tant

## 2024-01-28 ENCOUNTER — Ambulatory Visit (INDEPENDENT_AMBULATORY_CARE_PROVIDER_SITE_OTHER): Payer: PPO

## 2024-01-28 DIAGNOSIS — I442 Atrioventricular block, complete: Secondary | ICD-10-CM

## 2024-01-28 LAB — CUP PACEART REMOTE DEVICE CHECK
Battery Remaining Longevity: 13 mo
Battery Remaining Percentage: 16 %
Battery Voltage: 2.83 V
Date Time Interrogation Session: 20250417020026
Implantable Lead Connection Status: 753985
Implantable Lead Connection Status: 753985
Implantable Lead Implant Date: 20000217
Implantable Lead Implant Date: 20100414
Implantable Lead Location: 753858
Implantable Lead Location: 753860
Implantable Lead Model: 4196
Implantable Pulse Generator Implant Date: 20180418
Lead Channel Impedance Value: 380 Ohm
Lead Channel Impedance Value: 810 Ohm
Lead Channel Pacing Threshold Amplitude: 0.75 V
Lead Channel Pacing Threshold Amplitude: 1.5 V
Lead Channel Pacing Threshold Pulse Width: 0.5 ms
Lead Channel Pacing Threshold Pulse Width: 0.8 ms
Lead Channel Sensing Intrinsic Amplitude: 11.1 mV
Lead Channel Setting Pacing Amplitude: 2 V
Lead Channel Setting Pacing Amplitude: 2.5 V
Lead Channel Setting Pacing Pulse Width: 0.5 ms
Lead Channel Setting Pacing Pulse Width: 0.8 ms
Lead Channel Setting Sensing Sensitivity: 4 mV
Pulse Gen Model: 3222
Pulse Gen Serial Number: 8013108

## 2024-02-01 DIAGNOSIS — E785 Hyperlipidemia, unspecified: Secondary | ICD-10-CM | POA: Diagnosis not present

## 2024-02-01 DIAGNOSIS — I110 Hypertensive heart disease with heart failure: Secondary | ICD-10-CM | POA: Diagnosis not present

## 2024-02-01 DIAGNOSIS — D6869 Other thrombophilia: Secondary | ICD-10-CM | POA: Diagnosis not present

## 2024-02-01 DIAGNOSIS — M1990 Unspecified osteoarthritis, unspecified site: Secondary | ICD-10-CM | POA: Diagnosis not present

## 2024-02-01 DIAGNOSIS — I5022 Chronic systolic (congestive) heart failure: Secondary | ICD-10-CM | POA: Diagnosis not present

## 2024-02-01 DIAGNOSIS — I4891 Unspecified atrial fibrillation: Secondary | ICD-10-CM | POA: Diagnosis not present

## 2024-02-01 DIAGNOSIS — M109 Gout, unspecified: Secondary | ICD-10-CM | POA: Diagnosis not present

## 2024-02-04 ENCOUNTER — Encounter: Payer: Self-pay | Admitting: Internal Medicine

## 2024-02-04 DIAGNOSIS — Z471 Aftercare following joint replacement surgery: Secondary | ICD-10-CM | POA: Diagnosis not present

## 2024-02-04 DIAGNOSIS — Z96611 Presence of right artificial shoulder joint: Secondary | ICD-10-CM | POA: Diagnosis not present

## 2024-02-09 ENCOUNTER — Ambulatory Visit: Payer: PPO | Admitting: Adult Health

## 2024-03-09 NOTE — Progress Notes (Signed)
 Remote pacemaker transmission.

## 2024-03-10 DIAGNOSIS — Z5189 Encounter for other specified aftercare: Secondary | ICD-10-CM | POA: Diagnosis not present

## 2024-03-14 DIAGNOSIS — G4733 Obstructive sleep apnea (adult) (pediatric): Secondary | ICD-10-CM | POA: Diagnosis not present

## 2024-03-25 DIAGNOSIS — M25511 Pain in right shoulder: Secondary | ICD-10-CM | POA: Diagnosis not present

## 2024-03-25 DIAGNOSIS — M6281 Muscle weakness (generalized): Secondary | ICD-10-CM | POA: Diagnosis not present

## 2024-04-14 DIAGNOSIS — M6281 Muscle weakness (generalized): Secondary | ICD-10-CM | POA: Diagnosis not present

## 2024-04-21 NOTE — Progress Notes (Signed)
 PATIENT: [name] Page DOB: 10-23-1945  REASON FOR VISIT: follow up HISTORY FROM: patient  Chief Complaint  Patient presents with   Obstructive Sleep Apnea    Rm10, alone, Osa on cpap: pt stated that he is cpap compliant and that he feels great using cpap, well rested, no concerns. ESS score of 5     HISTORY OF PRESENT ILLNESS:  04/22/24 ALL:  [name] Page returns for follow up for OSA on CPAP. He continues to do well. He is using therapy nightly for about 7.5 hours, on average. He denies concerns with machine or supplies. He is followed by Dr Shepard regularly. Planning to replace pacemaker at the end of the year. Drives without difficulty. Recently moved to [place] with his wife.     11/10/2022 ALL:  [name] Page returns for follow up for OSA on CPAP. He reports getting a new machine with [facility] about a year ago. Airsence 10. We have not seen him in follow up since. He reports doing well. No concerns with machine or supplies.     11/07/2021 ALL: [name] Page is a 78 y.o. male here today for follow up for OSA on CPAP.   He reports he is doing well, and that he has no concerns at this time. His download for the last 30 days shows 100% compliance for both days used and for >4 hours. He is due for a new CPAP, we will order one for him today.     HISTORY: (copied from previous note) 11/01/20 SA: I reviewed his CPAP compliance data from 10/01/2020 through 10/30/2020, which is a total of 30 days, during which time he used his machine every night with percent use days greater than 4 hours at 100%, indicating superb compliance, average usage of 7 hours and 29 minutes, residual AHI at goal at 0.4/h, leak acceptable with a 95th percentile at 9.6 L/min on a pressure of 9 cm with EPR of 2.  He reports doing well.  He is up-to-date with his supplies, uses nasal pillows, gets his supplies quarterly through [facility].  He has no new complaints.  He sees his electrophysiologist in cardiology yearly and his  primary care twice yearly.      REVIEW OF SYSTEMS: Out of a complete 14 system review of symptoms, the patient complains only of the following symptoms, none and all other reviewed systems are negative.  ESS: 5/24  ALLERGIES: Allergies  Allergen Reactions   Jardiance  [Empagliflozin ] Swelling    HOME MEDICATIONS: Outpatient Medications Prior to Visit  Medication Sig Dispense Refill   carvedilol (COREG) 25 MG tablet Take 25 mg by mouth 2 (two) times daily with a meal.     colchicine 0.6 MG tablet Take 0.6 mg by mouth daily as needed (gout flare).      ELIQUIS 5 MG TABS tablet Take 5 mg by mouth 2 (two) times daily.     furosemide (LASIX) 40 MG tablet Take 40 mg by mouth daily as needed (swelling).     hydrochlorothiazide 25 MG tablet Take 25 mg by mouth daily.     Multiple Vitamin (MULTIVITAMIN WITH MINERALS) TABS tablet Take 1 tablet by mouth daily.     ramipril  (ALTACE ) 10 MG capsule Take 10 mg by mouth 2 (two) times daily.     rosuvastatin (CRESTOR) 20 MG tablet Take 20 mg by mouth daily.     ondansetron  (ZOFRAN ) 4 MG tablet Take 1 tablet (4 mg total) by mouth every 8 (eight) hours as needed for  refractory nausea / vomiting, vomiting or nausea. 30 tablet 1   oxyCODONE -acetaminophen  (PERCOCET) 5-325 MG tablet Take 1 tablet by mouth every 4 (four) hours as needed for severe pain (pain score 7-10). 30 tablet 0   No facility-administered medications prior to visit.    PAST MEDICAL HISTORY: Past Medical History:  Diagnosis Date   Anal fissure    Anal fistula    Arthritis    Atrial fibrillation (HCC)    Blood transfusion without reported diagnosis    Cardiomyopathy    Nonishemic, possibly tachycardia-mediated. 1997 LHC with no significant CAD. Myoview (7/09) showed EF 44% with possible mild inferior ischemia. Echo (9/10): EF 60%   Cataract    CHF (congestive heart failure) (HCC)    Chronic atrial fibrillation (HCC)    s/p AV nodal ablation and on Coumadin. Patient developed  dyspnea/fatigue with RV  pacing and an LV lead was placed   Colon polyp    hyperplastic and adenomatous   GERD (gastroesophageal reflux disease)    HTN (hypertension)    Hyperlipidemia    Hyperplastic colon polyp    and adenomatous   Osteoarthritis of right shoulder region    Pacemaker    St. Jude CRT-P device   Pseudogout    Sleep apnea    Upper GI bleed    With duodenal ulcer in 2005.  Patient was on ASA and coumadin at that time. ASA was stopped.SABRA    PAST SURGICAL HISTORY: Past Surgical History:  Procedure Laterality Date   BIV PACEMAKER GENERATOR CHANGEOUT N/A 01/28/2017   Procedure: BiV Pacemaker Generator Changeout;  Surgeon: Elspeth JAYSON Sage, MD;  Location: [facility] INVASIVE CV LAB;  Service: Cardiovascular;  Laterality: N/A;   CIRCUMCISION N/A 02/07/2022   Procedure: CIRCUMCISION ADULT;  Surgeon: Selma Donnice SAUNDERS, MD;  Location: [facility] ORS;  Service: Urology;  Laterality: N/A;   PACEMAKER INSERTION     St. Jude's   REVERSE SHOULDER ARTHROPLASTY Right 01/22/2024   Procedure: ARTHROPLASTY, SHOULDER, TOTAL, REVERSE;  Surgeon: Kay Kemps, MD;  Location: [facility] ORS;  Service: Orthopedics;  Laterality: Right;  interscalene block   SHOULDER SURGERY     Right   TONSILLECTOMY     TREATMENT FISTULA ANAL     WISDOM TOOTH EXTRACTION      FAMILY HISTORY: Family History  Problem Relation Age of Onset   Heart disease Mother    Stroke Father    Diverticulitis Sister    Diabetes Sister    Diabetes Sister    Colon cancer Neg Hx     SOCIAL HISTORY: Social History   Socioeconomic History   Marital status: Married    Spouse name: [name] Page   Number of children: 0   Years of education: some coll.   Highest education level: Not on file  Occupational History   Occupation: Investment banker, corporate: [employer]  Tobacco Use   Smoking status: Never   Smokeless tobacco: Never  Vaping Use   Vaping status: Never Used  Substance and Sexual Activity   Alcohol  use: Yes    Alcohol /week: 0.0  standard drinks of alcohol     Comment: Very rare   Drug use: No   Sexual activity: Not on file  Other Topics Concern   Not on file  Social History Narrative   Lives in [place].   Does a lot of traveling.   Exercises daily.   Daily Caffeine use.   Patient is right handed   Social Drivers of Corporate investment banker  Strain: Not on file  Food Insecurity: Not on file  Transportation Needs: Not on file  Physical Activity: Not on file  Stress: Not on file  Social Connections: Not on file  Intimate Partner Violence: Not on file     PHYSICAL EXAM  Vitals:   04/22/24 1123  BP: 139/75  Pulse: 80  Weight: 261 lb 8 oz (118.6 kg)  Height: 6' 1 (1.854 m)     Body mass index is 34.5 kg/m.  Generalized: Well developed, in no acute distress  Cardiology: normal rate and rhythm, no murmur noted Respiratory: clear to auscultation bilaterally  Neurological examination  Mentation: Alert oriented to time, place, history taking. Follows all commands speech and language fluent Cranial nerve II-XII: Pupils were equal round reactive to light. Extraocular movements were full, visual field were full  Motor: The motor testing reveals 5 over 5 strength of all 4 extremities. Good symmetric motor tone is noted throughout.  Gait and station: Gait is normal.    DIAGNOSTIC DATA (LABS, IMAGING, TESTING) - I reviewed patient records, labs, notes, testing and imaging myself where available.      No data to display           Lab Results  Component Value Date   WBC 6.3 01/12/2024   HGB 14.4 01/12/2024   HCT 42.0 01/12/2024   MCV 94.6 01/12/2024   PLT 238 01/12/2024      Component Value Date/Time   NA 138 01/12/2024 1130   NA 139 03/28/2021 1458   K 4.1 01/12/2024 1130   CL 104 01/12/2024 1130   CO2 26 01/12/2024 1130   GLUCOSE 125 (H) 01/12/2024 1130   BUN 13 01/12/2024 1130   BUN 16 03/28/2021 1458   CREATININE 0.73 01/12/2024 1130   CALCIUM 9.6 01/12/2024 1130    GFRNONAA >60 01/12/2024 1130   GFRAA 105 01/21/2017 1100   No results found for: CHOL, HDL, LDLCALC, LDLDIRECT, TRIG, CHOLHDL No results found for: YHAJ8R No results found for: VITAMINB12 No results found for: TSH   ASSESSMENT AND PLAN 78 y.o. year old male  has a past medical history of Anal fissure, Anal fistula, Arthritis, Atrial fibrillation (HCC), Blood transfusion without reported diagnosis, Cardiomyopathy, Cataract, CHF (congestive heart failure) (HCC), Chronic atrial fibrillation (HCC), Colon polyp, GERD (gastroesophageal reflux disease), HTN (hypertension), Hyperlipidemia, Hyperplastic colon polyp, Osteoarthritis of right shoulder region, Pacemaker, Pseudogout, Sleep apnea, and Upper GI bleed. here with     ICD-10-CM   1. OSA on CPAP  G47.33 For home use only DME continuous positive airway pressure (CPAP)      CHUNG CHAGOYA is doing well on CPAP therapy. Compliance report reveals excellent compliance. He was encouraged to continue using CPAP nightly and for greater than 4 hours each night. Risks of untreated sleep apnea review and education materials provided. Healthy lifestyle habits encouraged. He will follow up with me in 1 year. He verbalizes understanding and agreement with this plan.    Orders Placed This Encounter  Procedures   For home use only DME continuous positive airway pressure (CPAP)    Heated Humidity with all supplies as needed    Length of Need:   Lifetime    Patient has OSA or probable OSA:   Yes    Is the patient currently using CPAP in the home:   Yes    Settings:   Other see comments    CPAP supplies needed:   Mask, headgear, cushions, filters, heated tubing and water  chamber  No orders of the defined types were placed in this encounter.   Greig Forbes, FNP-C 04/22/2024, 11:36 AM [facility] [address] [place], [place] [address] [phone]

## 2024-04-21 NOTE — Patient Instructions (Signed)

## 2024-04-21 NOTE — Progress Notes (Signed)
 SABRA

## 2024-04-22 ENCOUNTER — Ambulatory Visit (INDEPENDENT_AMBULATORY_CARE_PROVIDER_SITE_OTHER): Admitting: Family Medicine

## 2024-04-22 ENCOUNTER — Encounter: Payer: Self-pay | Admitting: Family Medicine

## 2024-04-22 VITALS — BP 139/75 | HR 80 | Ht 73.0 in | Wt 261.5 lb

## 2024-04-22 DIAGNOSIS — G4733 Obstructive sleep apnea (adult) (pediatric): Secondary | ICD-10-CM

## 2024-04-22 NOTE — Progress Notes (Signed)
 Community message sent to [facility] that CPAP supplies order placed.

## 2024-04-28 ENCOUNTER — Ambulatory Visit: Payer: PPO

## 2024-04-28 DIAGNOSIS — I442 Atrioventricular block, complete: Secondary | ICD-10-CM | POA: Diagnosis not present

## 2024-04-28 LAB — CUP PACEART REMOTE DEVICE CHECK
Battery Remaining Longevity: 10 mo
Battery Remaining Percentage: 13 %
Battery Voltage: 2.81 V
Date Time Interrogation Session: 20250717020014
Implantable Lead Connection Status: 753985
Implantable Lead Connection Status: 753985
Implantable Lead Implant Date: 20000217
Implantable Lead Implant Date: 20100414
Implantable Lead Location: 753858
Implantable Lead Location: 753860
Implantable Lead Model: 4196
Implantable Pulse Generator Implant Date: 20180418
Lead Channel Impedance Value: 380 Ohm
Lead Channel Impedance Value: 900 Ohm
Lead Channel Pacing Threshold Amplitude: 0.75 V
Lead Channel Pacing Threshold Amplitude: 1.5 V
Lead Channel Pacing Threshold Pulse Width: 0.5 ms
Lead Channel Pacing Threshold Pulse Width: 0.8 ms
Lead Channel Sensing Intrinsic Amplitude: 10.2 mV
Lead Channel Setting Pacing Amplitude: 2 V
Lead Channel Setting Pacing Amplitude: 2.5 V
Lead Channel Setting Pacing Pulse Width: 0.5 ms
Lead Channel Setting Pacing Pulse Width: 0.8 ms
Lead Channel Setting Sensing Sensitivity: 4 mV
Pulse Gen Model: 3222
Pulse Gen Serial Number: 8013108

## 2024-05-02 ENCOUNTER — Ambulatory Visit: Payer: PPO | Admitting: Family Medicine

## 2024-05-03 ENCOUNTER — Ambulatory Visit: Admitting: Internal Medicine

## 2024-06-29 DIAGNOSIS — G4733 Obstructive sleep apnea (adult) (pediatric): Secondary | ICD-10-CM | POA: Diagnosis not present

## 2024-07-03 NOTE — Progress Notes (Unsigned)
 Cardiology Office Note   Date:  07/04/2024  ID:  [name] Page, [name] Page 1945/12/03, MRN [id number] PCP: Shepard Ade, MD  [facility] Providers Cardiologist:  Elspeth Sage, MD Electrophysiologist:  Donnice DELENA Primus, MD   History of Present Illness [name] Page is a 78 y.o. male with permanent AF and HFrecEF/TIC s/p AVNA and [place] VVI PPM (11/29/98) with subsequent addition of LV lead and upgrade to CRTP (01/24/09) 2/2 possible pacing induced CM, HTN, and HLD who presents for routine CRT-D follow-up.  He was last seen by Dr. Sage on 04/23/2023 and no changes were made at that visit.  His device function was normal and no programming changes were made.  Overall doing well today without any complaints.  He and his wife moved earlier this year into a retirement community and they are living in the independent portion of this.  His wife has some depression anxiety and has not dealing with the move disease he is he is.  He has been retired for 9 years from working in the [employer] ties to cut [employer].  He is 1 of 8 siblings.  2 of his sisters live semi local.  ROS: no complaints   Studies Reviewed  ECG review 07/04/24: bVP 78, QRS 152, QT/c 420/478 04/23/23: biVP 72, QRS 158, QT/c 438/479  TTE Result date: 04/30/21 1. Left ventricular ejection fraction, by estimation, is 50 to 55%. The left ventricle has low normal function. There is mild left ventricular hypertrophy. Left ventricular diastolic parameters are indeterminate. 2. Right ventricular systolic function is mildly reduced. The right ventricular size is normal. There is normal pulmonary artery systolic pressure. The estimated right ventricular systolic pressure is 22.4 mmHg. 3. Left atrial size was mildly dilated. 4. The mitral valve is normal in structure. No evidence of mitral valve regurgitation. 5. The aortic valve is tricuspid. Aortic valve regurgitation is not visualized. No aortic  stenosis is present. 6. The inferior vena cava is dilated in size with >50% respiratory variability, suggesting right atrial pressure of 8 mmHg.  Risk Assessment/Calculations  CHA2DS2-VASc Score = 4  This indicates a 4.8% annual risk of stroke. The patient's score is based upon: CHF History: 1 HTN History: 1 Diabetes History: 0 Stroke History: 0 Vascular Disease History: 0 Age Score: 2 Gender Score: 0  Physical Exam VS:  BP (!) 156/76   Pulse 74   Ht 6' 1 (1.854 m)   Wt 254 lb 4.8 oz (115.3 kg)   SpO2 97%   BMI 33.55 kg/m        Wt Readings from Last 3 Encounters:  07/04/24 254 lb 4.8 oz (115.3 kg)  04/22/24 261 lb 8 oz (118.6 kg)  01/22/24 251 lb 5.2 oz (114 kg)    GEN: Well nourished, well developed in no acute distress CARDIAC: RRR, no murmurs, rubs, gallops RESPIRATORY:  Clear to auscultation without rales, wheezing or rhonchi  ABDOMEN: Soft, non-tender, non-distended EXTREMITIES:  No edema; No deformity   Device Information  CRTP: [employer] RF [id number], NS [id number], DOI 01/28/17  RV: [employer] 1388TC, SN [id number], DOI 11/29/98 LV: Abbott 4196, SN [id number], DOI 01/24/09  Device Interrogation  Result date: 07/04/24 Longevity: 8.8 mo  RV: 430 ohms / 0.75 V @ 0.8 ms  LV: 990 ohms / 1.0 V @ 0.5 ms   ASSESSMENT AND PLAN [name] Page is a 78 y.o. male with permanent AF and HFrecEF/TIC s/p AVNA and [place] VVI PPM (11/29/98) with subsequent addition of  LV lead and upgrade to CRTP (01/24/09) 2/2 possible pacing induced CM, HTN, and HLD who presents for routine CRT-P follow-up.  [employer]ott CRTP  AVNA/CHB Permanent AF No complaints today and remains very active.  No HF symptoms and with 8.8 months until ERI.  Plan for follow-up in 9 months and scheduling for generator replacement.  Remains on Eliquis for stroke risk reduction.  No changes to his device today.     Dispo: f/u 9 months   A total of 30 minutes was spent preparing for the patient, reviewing history, performing  exam, document encounter, coordinating care and counseling the patient. 15 minutes was spent with direct patient care.   Signed, Donnice DELENA Primus, MD

## 2024-07-04 ENCOUNTER — Encounter: Payer: Self-pay | Admitting: Student in an Organized Health Care Education/Training Program

## 2024-07-04 ENCOUNTER — Ambulatory Visit
Attending: Student in an Organized Health Care Education/Training Program | Admitting: Student in an Organized Health Care Education/Training Program

## 2024-07-04 VITALS — BP 156/76 | HR 74 | Ht 73.0 in | Wt 254.3 lb

## 2024-07-04 DIAGNOSIS — I442 Atrioventricular block, complete: Secondary | ICD-10-CM

## 2024-07-04 NOTE — Patient Instructions (Signed)
 Medication Instructions:  Your physician recommends that you continue on your current medications as directed. Please refer to the Current Medication list given to you today.  *If you need a refill on your cardiac medications before your next appointment, please call your pharmacy*  Lab Work: None ordered.  If you have labs (blood work) drawn today and your tests are completely normal, you will receive your results only by: MyChart Message (if you have MyChart) OR A paper copy in the mail If you have any lab test that is abnormal or we need to change your treatment, we will call you to review the results.  Testing/Procedures: None ordered.   Follow-Up: At [facility], you and your health needs are our priority.  As part of our continuing mission to provide you with exceptional heart care, our providers are all part of one team.  This team includes your primary Cardiologist (physician) and Advanced Practice Providers or APPs (Physician Assistants and Nurse Practitioners) who all work together to provide you with the care you need, when you need it.  Your next appointment:   9 months with Dr Almetta

## 2024-07-19 NOTE — Progress Notes (Signed)
 Remote PPM Transmission

## 2024-07-28 ENCOUNTER — Ambulatory Visit

## 2024-07-28 DIAGNOSIS — I442 Atrioventricular block, complete: Secondary | ICD-10-CM | POA: Diagnosis not present

## 2024-07-28 LAB — CUP PACEART REMOTE DEVICE CHECK
Battery Remaining Longevity: 8 mo
Battery Remaining Percentage: 9 %
Battery Voltage: 2.78 V
Date Time Interrogation Session: 20251016034824
Implantable Lead Connection Status: 753985
Implantable Lead Connection Status: 753985
Implantable Lead Implant Date: 20000217
Implantable Lead Implant Date: 20100414
Implantable Lead Location: 753858
Implantable Lead Location: 753860
Implantable Lead Model: 4196
Implantable Pulse Generator Implant Date: 20180418
Lead Channel Impedance Value: 410 Ohm
Lead Channel Impedance Value: 960 Ohm
Lead Channel Pacing Threshold Amplitude: 0.75 V
Lead Channel Pacing Threshold Amplitude: 1 V
Lead Channel Pacing Threshold Pulse Width: 0.5 ms
Lead Channel Pacing Threshold Pulse Width: 0.8 ms
Lead Channel Sensing Intrinsic Amplitude: 10.2 mV
Lead Channel Setting Pacing Amplitude: 2 V
Lead Channel Setting Pacing Amplitude: 2.5 V
Lead Channel Setting Pacing Pulse Width: 0.5 ms
Lead Channel Setting Pacing Pulse Width: 0.8 ms
Lead Channel Setting Sensing Sensitivity: 4 mV
Pulse Gen Model: 3222
Pulse Gen Serial Number: 8013108

## 2024-08-01 DIAGNOSIS — E785 Hyperlipidemia, unspecified: Secondary | ICD-10-CM | POA: Diagnosis not present

## 2024-08-01 DIAGNOSIS — I110 Hypertensive heart disease with heart failure: Secondary | ICD-10-CM | POA: Diagnosis not present

## 2024-08-01 DIAGNOSIS — Z1389 Encounter for screening for other disorder: Secondary | ICD-10-CM | POA: Diagnosis not present

## 2024-08-01 DIAGNOSIS — Z1212 Encounter for screening for malignant neoplasm of rectum: Secondary | ICD-10-CM | POA: Diagnosis not present

## 2024-08-01 DIAGNOSIS — M109 Gout, unspecified: Secondary | ICD-10-CM | POA: Diagnosis not present

## 2024-08-01 DIAGNOSIS — Z125 Encounter for screening for malignant neoplasm of prostate: Secondary | ICD-10-CM | POA: Diagnosis not present

## 2024-08-03 NOTE — Progress Notes (Signed)
 Remote PPM Transmission

## 2024-08-07 ENCOUNTER — Ambulatory Visit: Payer: Self-pay | Admitting: Student in an Organized Health Care Education/Training Program

## 2024-08-08 DIAGNOSIS — R82998 Other abnormal findings in urine: Secondary | ICD-10-CM | POA: Diagnosis not present

## 2024-08-08 DIAGNOSIS — I4891 Unspecified atrial fibrillation: Secondary | ICD-10-CM | POA: Diagnosis not present

## 2024-08-08 DIAGNOSIS — Z1212 Encounter for screening for malignant neoplasm of rectum: Secondary | ICD-10-CM | POA: Diagnosis not present

## 2024-08-08 DIAGNOSIS — E785 Hyperlipidemia, unspecified: Secondary | ICD-10-CM | POA: Diagnosis not present

## 2024-08-08 DIAGNOSIS — D6869 Other thrombophilia: Secondary | ICD-10-CM | POA: Diagnosis not present

## 2024-08-08 DIAGNOSIS — M109 Gout, unspecified: Secondary | ICD-10-CM | POA: Diagnosis not present

## 2024-08-08 DIAGNOSIS — Z125 Encounter for screening for malignant neoplasm of prostate: Secondary | ICD-10-CM | POA: Diagnosis not present

## 2024-08-08 DIAGNOSIS — G4730 Sleep apnea, unspecified: Secondary | ICD-10-CM | POA: Diagnosis not present

## 2024-08-08 DIAGNOSIS — Z1389 Encounter for screening for other disorder: Secondary | ICD-10-CM | POA: Diagnosis not present

## 2024-08-08 DIAGNOSIS — Z0000 Encounter for general adult medical examination without abnormal findings: Secondary | ICD-10-CM | POA: Diagnosis not present

## 2024-08-08 DIAGNOSIS — Z1331 Encounter for screening for depression: Secondary | ICD-10-CM | POA: Diagnosis not present

## 2024-08-08 DIAGNOSIS — I10 Essential (primary) hypertension: Secondary | ICD-10-CM | POA: Diagnosis not present

## 2024-08-08 DIAGNOSIS — Z1339 Encounter for screening examination for other mental health and behavioral disorders: Secondary | ICD-10-CM | POA: Diagnosis not present

## 2024-08-19 ENCOUNTER — Ambulatory Visit: Payer: Self-pay | Admitting: Internal Medicine

## 2024-09-05 ENCOUNTER — Ambulatory Visit
Admission: EM | Admit: 2024-09-05 | Discharge: 2024-09-05 | Disposition: A | Discharge status: Home / Self Care | Attending: Nurse Practitioner | Admitting: Nurse Practitioner

## 2024-09-05 ENCOUNTER — Ambulatory Visit (INDEPENDENT_AMBULATORY_CARE_PROVIDER_SITE_OTHER)

## 2024-09-05 DIAGNOSIS — J069 Acute upper respiratory infection, unspecified: Secondary | ICD-10-CM | POA: Diagnosis not present

## 2024-09-05 DIAGNOSIS — Z950 Presence of cardiac pacemaker: Secondary | ICD-10-CM | POA: Diagnosis not present

## 2024-09-05 DIAGNOSIS — R059 Cough, unspecified: Secondary | ICD-10-CM | POA: Diagnosis not present

## 2024-09-05 DIAGNOSIS — R030 Elevated blood-pressure reading, without diagnosis of hypertension: Secondary | ICD-10-CM

## 2024-09-05 DIAGNOSIS — R051 Acute cough: Secondary | ICD-10-CM

## 2024-09-05 DIAGNOSIS — R0602 Shortness of breath: Secondary | ICD-10-CM | POA: Diagnosis not present

## 2024-09-05 MED ORDER — ALBUTEROL SULFATE HFA 108 (90 BASE) MCG/ACT IN AERS: 2.0000 | INHALATION_SPRAY | RESPIRATORY_TRACT | 0 refills | Status: AC | PRN

## 2024-09-05 MED ORDER — PREDNISONE 20 MG PO TABS: 20.0000 mg | ORAL_TABLET | Freq: Two times a day (BID) | ORAL | 0 refills | Status: AC

## 2024-09-05 MED ORDER — AMOXICILLIN-POT CLAVULANATE 875-125 MG PO TABS: 1.0000 | ORAL_TABLET | Freq: Two times a day (BID) | ORAL | 0 refills | Status: AC

## 2024-09-05 NOTE — Discharge Instructions (Signed)
 Waiting on the results of your chest x-ray - if your plan needs to change, we will let you know. Augmentin  (antibiotic) to treat your upper respiratory infection and cough. A short steroid burst to help decrease inflammation. Albuterol  inhaler - may use every 4 hours while awake for the next 2-3 days to help you breathe better. Recommend Coricidin HBP Cough/Cold for symptom management that won't elevate your blood pressure. Your blood pressure is elevated today - recheck at home and follow up with your doctor if it remains > 140/90.

## 2024-09-05 NOTE — ED Provider Notes (Signed)
 [facility]    CSN: [id number] Arrival date & time: 09/05/24  1808      History   Chief Complaint Chief Complaint  Patient presents with   Shortness of Breath   Cough    HPI [name] Page is a 78 y.o. male.   Patient presents for evaluation of a 3-week history of waxing and waning URI symptoms and a cough.  Patient states that he played golf yesterday and felt somewhat reasonable.  Onset today of worsening symptoms.  He is experiencing a headache, nasal congestion, cough, shortness of breath associated with the cough, diaphragmatic discomfort from the cough, and generally feeling unwell.  He has utilized guaifenesin and Tylenol  without any improvement of symptoms.  No overt fever, chest pain, shortness of breath at rest, abdominal pain, vomiting, or diarrhea.  The history is provided by the patient.  Shortness of Breath Associated symptoms: cough and headaches   Associated symptoms: no abdominal pain, no chest pain, no fever, no rash, no sore throat and no vomiting   Cough Associated symptoms: headaches, rhinorrhea and shortness of breath   Associated symptoms: no chest pain, no chills, no fever, no myalgias, no rash and no sore throat     Past Medical History:  Diagnosis Date   Anal fissure    Anal fistula    Arthritis    Atrial fibrillation (HCC)    Blood transfusion without reported diagnosis    Cardiomyopathy    Nonishemic, possibly tachycardia-mediated. 1997 LHC with no significant CAD. Myoview (7/09) showed EF 44% with possible mild inferior ischemia. Echo (9/10): EF 60%   Cataract    CHF (congestive heart failure) (HCC)    Chronic atrial fibrillation (HCC)    s/p AV nodal ablation and on Coumadin. Patient developed dyspnea/fatigue with RV  pacing and an LV lead was placed   Colon polyp    hyperplastic and adenomatous   GERD (gastroesophageal reflux disease)    HTN (hypertension)    Hyperlipidemia    Hyperplastic colon polyp    and adenomatous    Osteoarthritis of right shoulder region    Pacemaker    St. Jude CRT-P device   Pseudogout    Sleep apnea    Upper GI bleed    With duodenal ulcer in 2005.  Patient was on ASA and coumadin at that time. ASA was stopped..    Patient Active Problem List   Diagnosis Date Noted   Chronic systolic heart failure (HCC) 04/29/2011   History of colonic polyps 02/25/2010   HYPERTENSION, BENIGN 09/16/2008   AV BLOCK, COMPLETE 09/16/2008   ATRIAL FIBRILLATION 09/16/2008   CARDIAC PACEMAKER CRT- STJ 09/16/2008    Past Surgical History:  Procedure Laterality Date   BIV PACEMAKER GENERATOR CHANGEOUT N/A 01/28/2017   Procedure: BiV Pacemaker Generator Changeout;  Surgeon: Elspeth JAYSON Sage, MD;  Location: [facility] INVASIVE CV LAB;  Service: Cardiovascular;  Laterality: N/A;   CIRCUMCISION N/A 02/07/2022   Procedure: CIRCUMCISION ADULT;  Surgeon: Selma Donnice SAUNDERS, MD;  Location: [facility];  Service: Urology;  Laterality: N/A;   PACEMAKER INSERTION     St. Jude's   REVERSE SHOULDER ARTHROPLASTY Right 01/22/2024   Procedure: ARTHROPLASTY, SHOULDER, TOTAL, REVERSE;  Surgeon: Kay Kemps, MD;  Location: [facility];  Service: Orthopedics;  Laterality: Right;  interscalene block   SHOULDER SURGERY     Right   TONSILLECTOMY     TREATMENT FISTULA ANAL     WISDOM TOOTH EXTRACTION  Home Medications    Prior to Admission medications   Medication Sig Start Date End Date Taking? Authorizing Provider  albuterol  (VENTOLIN  HFA) 108 (90 Base) MCG/ACT inhaler Inhale 2 puffs into the lungs every 4 (four) hours as needed for wheezing or shortness of breath. 09/05/24  Yes Janet Therisa PARAS, FNP  amoxicillin -clavulanate (AUGMENTIN ) 875-125 MG tablet Take 1 tablet by mouth every 12 (twelve) hours for 10 days. 09/05/24 09/15/24 Yes Janet Therisa PARAS, FNP  predniSONE  (DELTASONE ) 20 MG tablet Take 1 tablet (20 mg total) by mouth 2 (two) times daily for 5 days. 09/05/24 09/10/24 Yes Janet Therisa PARAS, FNP  carvedilol (COREG) 25 MG  tablet Take 25 mg by mouth 2 (two) times daily with a meal.    [provider]  colchicine 0.6 MG tablet Take 0.6 mg by mouth daily as needed (gout flare).  03/01/15   [provider]  ELIQUIS 5 MG TABS tablet Take 5 mg by mouth 2 (two) times daily. 06/01/15   [provider]  furosemide (LASIX) 40 MG tablet Take 40 mg by mouth daily as needed (swelling).    [provider]  hydrochlorothiazide 25 MG tablet Take 25 mg by mouth daily.    [provider]  Multiple Vitamin (MULTIVITAMIN WITH MINERALS) TABS tablet Take 1 tablet by mouth daily.    [provider]  ramipril  (ALTACE ) 10 MG capsule Take 10 mg by mouth 2 (two) times daily.    [provider]  rosuvastatin (CRESTOR) 20 MG tablet Take 20 mg by mouth daily.    [provider]    Family History Family History  Problem Relation Age of Onset   Heart disease Mother    Stroke Father    Diverticulitis Sister    Diabetes Sister    Diabetes Sister    Colon cancer Neg Hx     Social History Social History   Tobacco Use   Smoking status: Never   Smokeless tobacco: Never  Vaping Use   Vaping status: Never Used  Substance Use Topics   Alcohol  use: Yes    Alcohol /week: 0.0 standard drinks of alcohol     Comment: Very rare   Drug use: No     Allergies   Jardiance  [empagliflozin ]   Review of Systems Review of Systems  Constitutional:  Positive for fatigue. Negative for chills and fever.  HENT:  Positive for congestion and rhinorrhea. Negative for sore throat.   Eyes:  Negative for pain and redness.  Respiratory:  Positive for cough and shortness of breath.   Cardiovascular:  Negative for chest pain.  Gastrointestinal:  Negative for abdominal pain, diarrhea and vomiting.  Genitourinary:  Negative for dysuria and urgency.  Musculoskeletal:  Negative for arthralgias and myalgias.  Skin:  Negative for rash.  Neurological:  Positive for headaches. Negative for  dizziness.     Physical Exam Triage Vital Signs ED Triage Vitals [09/05/24 1926]  Encounter Vitals Group     BP (!) 178/91     Girls Systolic BP Percentile      Girls Diastolic BP Percentile      Boys Systolic BP Percentile      Boys Diastolic BP Percentile      Pulse Rate (!) 105     Resp (!) 21     Temp 98 F (36.7 C)     Temp src      SpO2 93 %     Weight      Height  Head Circumference      Peak Flow      Pain Score 5     Pain Loc      Pain Education      Exclude from Growth Chart    No data found.  Updated Vital Signs BP (!) 178/91 (BP Location: Left Arm)   Pulse (!) 105   Temp 98 F (36.7 C)   Resp (!) 21   SpO2 93%   Physical Exam Vitals and nursing note reviewed.  Constitutional:      Appearance: Normal appearance. He is well-developed.  HENT:     Head: Normocephalic.     Right Ear: Tympanic membrane and ear canal normal.     Left Ear: Tympanic membrane and ear canal normal.     Nose: Congestion present.     Mouth/Throat:     Mouth: Mucous membranes are moist.     Pharynx: No posterior oropharyngeal erythema.  Eyes:     Conjunctiva/sclera: Conjunctivae normal.     Pupils: Pupils are equal, round, and reactive to light.  Cardiovascular:     Rate and Rhythm: Regular rhythm.     Heart sounds: Normal heart sounds. No murmur heard. Pulmonary:     Effort: Pulmonary effort is normal.     Breath sounds: No rhonchi or rales.     Comments: Lung sounds diminished in the right lower lobe.  Repeat pulse oximetry 95% on room air. Abdominal:     General: Bowel sounds are normal.  Skin:    General: Skin is warm and dry.  Neurological:     General: No focal deficit present.     Mental Status: He is alert and oriented to person, place, and time.  Psychiatric:        Mood and Affect: Mood normal.        Behavior: Behavior normal.        Thought Content: Thought content normal.        Judgment: Judgment normal.      UC Treatments / Results   Labs (all labs ordered are listed, but only abnormal results are displayed) Labs Reviewed - No data to display  EKG   Radiology No results found.  Procedures Procedures (including critical care time)  Medications Ordered in UC Medications - No data to display  Initial Impression / Assessment and Plan / UC Course  I have reviewed the triage vital signs and the nursing notes.  Pertinent labs & imaging results that were available during my care of the patient were reviewed by me and considered in my medical decision making (see chart for details).    Patient presents for evaluation of a 3-week history of waxing and waning URI symptoms and a cough.  Symptoms seem to worsen today-experiencing persistent productive cough, shortness of breath with a cough, fatigue, and continued headache with nasal congestion.  Pending radiology interpretation of the chest x-ray that was obtained today.  However based upon the duration of symptoms and the patient's age-reasonable to provide antibiotic therapy at this time.  Have also provided a short low-dose steroid burst and an albuterol  inhaler for additional symptom management support.  Patient's blood pressure is elevated today-has been using OTC cough and cold products.  I recommended Coricidin HBP cough and cold as a safer alternative.  Request that he recheck his blood pressure at home and follow-up if it remains greater than 140/90.  Low threshold for return evaluation should fever, shortness of breath worsen, or chest discomfort develop.  Ensure adequate rest and oral hydration.  Final Clinical Impressions(s) / UC Diagnoses   Final diagnoses:  Acute cough  Acute upper respiratory infection  Elevated blood pressure reading     Discharge Instructions      Waiting on the results of your chest x-ray - if your plan needs to change, we will let you know. Augmentin  (antibiotic) to treat your upper respiratory infection and cough. A short steroid  burst to help decrease inflammation. Albuterol  inhaler - may use every 4 hours while awake for the next 2-3 days to help you breathe better. Recommend Coricidin HBP Cough/Cold for symptom management that won't elevate your blood pressure. Your blood pressure is elevated today - recheck at home and follow up with your doctor if it remains > 140/90.     ED Prescriptions     Medication Sig Dispense Auth. Provider   amoxicillin -clavulanate (AUGMENTIN ) 875-125 MG tablet Take 1 tablet by mouth every 12 (twelve) hours for 10 days. 20 tablet Janet Therisa PARAS, FNP   predniSONE  (DELTASONE ) 20 MG tablet Take 1 tablet (20 mg total) by mouth 2 (two) times daily for 5 days. 10 tablet Janet Therisa PARAS, FNP   albuterol  (VENTOLIN  HFA) 108 (90 Base) MCG/ACT inhaler Inhale 2 puffs into the lungs every 4 (four) hours as needed for wheezing or shortness of breath. 18 g Janet Therisa PARAS, FNP      PDMP not reviewed this encounter.   Janet Therisa PARAS, FNP 09/05/24 [phone]

## 2024-09-05 NOTE — ED Triage Notes (Signed)
 Pt present with c/o cough, SOB, soreness in ribs, runny nose and headaches x 3 weeks. Pt states he has taken cough syrups and tylenol  for relief

## 2024-09-25 ENCOUNTER — Ambulatory Visit: Admitting: Radiology

## 2024-09-25 ENCOUNTER — Ambulatory Visit
Admission: EM | Admit: 2024-09-25 | Discharge: 2024-09-25 | Disposition: A | Discharge status: Home / Self Care | Source: Home / Self Care

## 2024-09-25 ENCOUNTER — Other Ambulatory Visit: Payer: Self-pay

## 2024-09-25 DIAGNOSIS — R058 Other specified cough: Secondary | ICD-10-CM

## 2024-09-25 DIAGNOSIS — R0989 Other specified symptoms and signs involving the circulatory and respiratory systems: Secondary | ICD-10-CM | POA: Diagnosis not present

## 2024-09-25 DIAGNOSIS — R0602 Shortness of breath: Secondary | ICD-10-CM | POA: Diagnosis not present

## 2024-09-25 DIAGNOSIS — R051 Acute cough: Secondary | ICD-10-CM | POA: Diagnosis not present

## 2024-09-25 MED ORDER — PREDNISONE 20 MG PO TABS: 40.0000 mg | ORAL_TABLET | Freq: Every day | ORAL | 0 refills | Status: AC

## 2024-09-25 MED ORDER — AMOXICILLIN-POT CLAVULANATE 875-125 MG PO TABS: 1.0000 | ORAL_TABLET | Freq: Two times a day (BID) | ORAL | 0 refills | Status: AC

## 2024-09-25 NOTE — ED Triage Notes (Signed)
 Pt presents with a chief complaint of cough x 1.5 months. States he is coughing up yellow mucus. Was seen at [facility] for same symptoms on 11/24. Prescribed antibiotic + steroid with improvement/relief. Cough has lingered on and pt is concerned. Denies SOB. No pain at this time.

## 2024-09-25 NOTE — ED Provider Notes (Signed)
 [facility]    CSN: [id number] Arrival date & time: 09/25/24  9074      History   Chief Complaint Chief Complaint  Patient presents with   Cough    HPI [name] Page is a 78 y.o. male.  has a past medical history of Anal fissure, Anal fistula, Arthritis, Atrial fibrillation (HCC), Blood transfusion without reported diagnosis, Cardiomyopathy, Cataract, CHF (congestive heart failure) (HCC), Chronic atrial fibrillation (HCC), Colon polyp, GERD (gastroesophageal reflux disease), HTN (hypertension), Hyperlipidemia, Hyperplastic colon polyp, Osteoarthritis of right shoulder region, Pacemaker, Pseudogout, Sleep apnea, and Upper GI bleed.   HPI  Discussed the use of AI scribe software for clinical note transcription with the patient, who gave verbal consent to proceed.  The patient presents with a persistent cough.  The cough has been present since the first week of November and is productive of sputum that varies between clear and yellow. Despite initial improvement with antibiotics, steroids, and inhalants, the cough persists. They have been using Coricidin at home and an inhaler, although they report not using the inhaler as much as they should and have not noticed improvement in their cough with its use.  No fevers, chills, shortness of breath, or wheezing. They have nasal congestion and a runny nose.  There is no history of asthma or COPD. They are concerned about the duration of their symptoms, which have persisted for over a month.   Past Medical History:  Diagnosis Date   Anal fissure    Anal fistula    Arthritis    Atrial fibrillation (HCC)    Blood transfusion without reported diagnosis    Cardiomyopathy    Nonishemic, possibly tachycardia-mediated. 1997 LHC with no significant CAD. Myoview (7/09) showed EF 44% with possible mild inferior ischemia. Echo (9/10): EF 60%   Cataract    CHF (congestive heart failure) (HCC)    Chronic atrial fibrillation (HCC)     s/p AV nodal ablation and on Coumadin. Patient developed dyspnea/fatigue with RV  pacing and an LV lead was placed   Colon polyp    hyperplastic and adenomatous   GERD (gastroesophageal reflux disease)    HTN (hypertension)    Hyperlipidemia    Hyperplastic colon polyp    and adenomatous   Osteoarthritis of right shoulder region    Pacemaker    St. Jude CRT-P device   Pseudogout    Sleep apnea    Upper GI bleed    With duodenal ulcer in 2005.  Patient was on ASA and coumadin at that time. ASA was stopped..    Patient Active Problem List   Diagnosis Date Noted   Chronic systolic heart failure (HCC) 04/29/2011   History of colonic polyps 02/25/2010   HYPERTENSION, BENIGN 09/16/2008   AV BLOCK, COMPLETE 09/16/2008   ATRIAL FIBRILLATION 09/16/2008   CARDIAC PACEMAKER CRT- STJ 09/16/2008    Past Surgical History:  Procedure Laterality Date   BIV PACEMAKER GENERATOR CHANGEOUT N/A 01/28/2017   Procedure: BiV Pacemaker Generator Changeout;  Surgeon: Elspeth JAYSON Sage, MD;  Location: [facility] INVASIVE CV LAB;  Service: Cardiovascular;  Laterality: N/A;   CIRCUMCISION N/A 02/07/2022   Procedure: CIRCUMCISION ADULT;  Surgeon: Selma Donnice SAUNDERS, MD;  Location: [facility];  Service: Urology;  Laterality: N/A;   PACEMAKER INSERTION     St. Jude's   REVERSE SHOULDER ARTHROPLASTY Right 01/22/2024   Procedure: ARTHROPLASTY, SHOULDER, TOTAL, REVERSE;  Surgeon: Kay Kemps, MD;  Location: [facility];  Service: Orthopedics;  Laterality: Right;  interscalene block  SHOULDER SURGERY     Right   TONSILLECTOMY     TREATMENT FISTULA ANAL     WISDOM TOOTH EXTRACTION         Home Medications    Prior to Admission medications  Medication Sig Start Date End Date Taking? Authorizing Provider  amoxicillin -clavulanate (AUGMENTIN ) 875-125 MG tablet Take 1 tablet by mouth every 12 (twelve) hours. 09/25/24  Yes Eliyanna Ault E, PA-C  predniSONE  (DELTASONE ) 20 MG tablet Take 2 tablets (40 mg total) by mouth daily for 5  days. 09/25/24 09/30/24 Yes Danique Hartsough E, PA-C  albuterol  (VENTOLIN  HFA) 108 (90 Base) MCG/ACT inhaler Inhale 2 puffs into the lungs every 4 (four) hours as needed for wheezing or shortness of breath. 09/05/24   Janet Therisa PARAS, FNP  carvedilol (COREG) 25 MG tablet Take 25 mg by mouth 2 (two) times daily with a meal.    [provider]  colchicine 0.6 MG tablet Take 0.6 mg by mouth daily as needed (gout flare).  03/01/15   [provider]  ELIQUIS 5 MG TABS tablet Take 5 mg by mouth 2 (two) times daily. 06/01/15   [provider]  furosemide (LASIX) 40 MG tablet Take 40 mg by mouth daily as needed (swelling).    [provider]  hydrochlorothiazide 25 MG tablet Take 25 mg by mouth daily.    [provider]  Multiple Vitamin (MULTIVITAMIN WITH MINERALS) TABS tablet Take 1 tablet by mouth daily.    [provider]  ramipril  (ALTACE ) 10 MG capsule Take 10 mg by mouth 2 (two) times daily.    [provider]  rosuvastatin (CRESTOR) 20 MG tablet Take 20 mg by mouth daily.    [provider]    Family History Family History  Problem Relation Age of Onset   Heart disease Mother    Stroke Father    Diverticulitis Sister    Diabetes Sister    Diabetes Sister    Colon cancer Neg Hx     Social History Social History[1]   Allergies   Jardiance  [empagliflozin ]   Review of Systems Review of Systems  Constitutional:  Negative for chills and fever.  HENT:  Positive for congestion. Negative for postnasal drip, rhinorrhea, sinus pressure and sinus pain.   Respiratory:  Positive for cough. Negative for shortness of breath and wheezing.      Physical Exam Triage Vital Signs ED Triage Vitals  Encounter Vitals Group     BP 09/25/24 0943 105/70     Girls Systolic BP Percentile --      Girls Diastolic BP Percentile --      Boys Systolic BP Percentile --      Boys Diastolic BP Percentile --      Pulse Rate 09/25/24 0943 (S)  (!) 113     Resp 09/25/24 0943 17     Temp 09/25/24 0943 97.9 F (36.6 C)     Temp Source 09/25/24 0943 Oral     SpO2 09/25/24 0943 96 %     Weight 09/25/24 0942 255 lb (115.7 kg)     Height 09/25/24 0942 6' 1 (1.854 m)     Head Circumference --      Peak Flow --      Pain Score 09/25/24 0942 0     Pain Loc --      Pain Education --      Exclude from Growth Chart --    No data found.  Updated Vital Signs BP  105/70 (BP Location: Right Arm)   Pulse 99   Temp 97.9 F (36.6 C) (Oral)   Resp 17   Ht 6' 1 (1.854 m)   Wt 255 lb (115.7 kg)   SpO2 96%   BMI 33.64 kg/m   Visual Acuity Right Eye Distance:   Left Eye Distance:   Bilateral Distance:    Right Eye Near:   Left Eye Near:    Bilateral Near:     Physical Exam Vitals reviewed.  Constitutional:      General: He is awake. He is not in acute distress.    Appearance: Normal appearance. He is well-developed and well-groomed. He is not ill-appearing, toxic-appearing or diaphoretic.  HENT:     Head: Normocephalic and atraumatic.  Eyes:     Extraocular Movements: Extraocular movements intact.     Conjunctiva/sclera: Conjunctivae normal.  Cardiovascular:     Rate and Rhythm: Normal rate and regular rhythm.     Heart sounds: Normal heart sounds. No murmur heard.    No friction rub. No gallop.  Pulmonary:     Effort: Pulmonary effort is normal.  Musculoskeletal:     Cervical back: Normal range of motion.  Skin:    General: Skin is warm and dry.  Neurological:     General: No focal deficit present.     Mental Status: He is alert and oriented to person, place, and time.  Psychiatric:        Attention and Perception: Attention normal.        Mood and Affect: Mood normal.        Speech: Speech normal.        Behavior: Behavior normal. Behavior is cooperative.        Thought Content: Thought content normal.        Judgment: Judgment normal.      UC Treatments / Results  Labs (all labs ordered are listed, but  only abnormal results are displayed) Labs Reviewed - No data to display  EKG   Radiology DG Chest 2 View Result Date: 09/25/2024 CLINICAL DATA:  Productive cough for several weeks.  Tachycardia. EXAM: CHEST - 2 VIEW COMPARISON:  09/05/2024 FINDINGS: The heart size and mediastinal contours are within normal limits. Triple lead pacemaker remains in appropriate position. Both lungs are clear. Right shoulder prosthesis again noted. IMPRESSION: No active cardiopulmonary disease. Electronically Signed   By: Norleen DELENA Kil M.D.   On: 09/25/2024 10:44    Procedures Procedures (including critical care time)  Medications Ordered in UC Medications - No data to display  Initial Impression / Assessment and Plan / UC Course  I have reviewed the triage vital signs and the nursing notes.  Pertinent labs & imaging results that were available during my care of the patient were reviewed by me and considered in my medical decision making (see chart for details).      Final Clinical Impressions(s) / UC Diagnoses   Final diagnoses:  Acute cough  Productive cough  SOB (shortness of breath)  Decreased breath sounds of both lungs   Acute cough with possible pneumonia or sinus infection Persistent cough since early November with episodes of productive cough, sometimes with yellow sputum. No fever, chills, or significant nasal congestion. Previous treatment with azithromycin, steroids, and inhalants provided some improvement but not complete resolution. No wheezing or crackles on lung examination. Awaiting chest x-ray results to rule out pneumonia. Differential includes sinus infection or unresolved pneumonia. - Prescribed Augmentin  for one week to address possible  sinus infection or pneumonia. - Repeated steroid treatment. - Await chest x-ray results and adjust treatment regimen if necessary.    Discharge Instructions      VISIT SUMMARY:  You came in today because of a persistent cough that has  been ongoing since early November. Despite initial treatment with antibiotics, steroids, and inhalants, your symptoms have not fully resolved. You have nasal congestion and a runny nose but no fever, chills, shortness of breath, or wheezing. We are awaiting the results of your chest x-ray to rule out pneumonia.  YOUR PLAN:  -ACUTE COUGH WITH POSSIBLE PNEUMONIA OR SINUS INFECTION: An acute cough can be caused by infections like pneumonia or a sinus infection. You have been experiencing a persistent cough with sputum production for over a month. We have prescribed Augmentin  for one week to treat a possible sinus infection or pneumonia and repeated your steroid treatment. We will review your chest x-ray results and adjust your treatment if necessary.  INSTRUCTIONS:  Please take the prescribed Augmentin  for one week as directed and continue with the steroid treatment. Use your inhaler as prescribed. We will contact you with the results of your chest x-ray and provide further instructions based on the findings.     ED Prescriptions     Medication Sig Dispense Auth. Provider   amoxicillin -clavulanate (AUGMENTIN ) 875-125 MG tablet Take 1 tablet by mouth every 12 (twelve) hours. 14 tablet Mahagony Grieb E, PA-C   predniSONE  (DELTASONE ) 20 MG tablet Take 2 tablets (40 mg total) by mouth daily for 5 days. 10 tablet Yandel Zeiner E, PA-C      PDMP not reviewed this encounter.     [1]  Social History Tobacco Use   Smoking status: Never   Smokeless tobacco: Never  Vaping Use   Vaping status: Never Used  Substance Use Topics   Alcohol  use: Yes    Alcohol /week: 0.0 standard drinks of alcohol     Comment: Very rare   Drug use: No     Neils Siracusa, Rocky BRAVO, PA-C 09/25/24 1430

## 2024-09-25 NOTE — Discharge Instructions (Signed)
 VISIT SUMMARY:  You came in today because of a persistent cough that has been ongoing since early November. Despite initial treatment with antibiotics, steroids, and inhalants, your symptoms have not fully resolved. You have nasal congestion and a runny nose but no fever, chills, shortness of breath, or wheezing. We are awaiting the results of your chest x-ray to rule out pneumonia.  YOUR PLAN:  -ACUTE COUGH WITH POSSIBLE PNEUMONIA OR SINUS INFECTION: An acute cough can be caused by infections like pneumonia or a sinus infection. You have been experiencing a persistent cough with sputum production for over a month. We have prescribed Augmentin  for one week to treat a possible sinus infection or pneumonia and repeated your steroid treatment. We will review your chest x-ray results and adjust your treatment if necessary.  INSTRUCTIONS:  Please take the prescribed Augmentin  for one week as directed and continue with the steroid treatment. Use your inhaler as prescribed. We will contact you with the results of your chest x-ray and provide further instructions based on the findings.

## 2024-10-27 ENCOUNTER — Ambulatory Visit

## 2024-10-27 DIAGNOSIS — I442 Atrioventricular block, complete: Secondary | ICD-10-CM | POA: Diagnosis not present

## 2024-10-28 LAB — CUP PACEART REMOTE DEVICE CHECK
Battery Remaining Longevity: 6 mo
Battery Remaining Percentage: 6 %
Battery Voltage: 2.72 V
Date Time Interrogation Session: 20260115020013
Implantable Lead Connection Status: 753985
Implantable Lead Connection Status: 753985
Implantable Lead Implant Date: 20000217
Implantable Lead Implant Date: 20100414
Implantable Lead Location: 753858
Implantable Lead Location: 753860
Implantable Lead Model: 4196
Implantable Pulse Generator Implant Date: 20180418
Lead Channel Impedance Value: 410 Ohm
Lead Channel Impedance Value: 960 Ohm
Lead Channel Pacing Threshold Amplitude: 0.75 V
Lead Channel Pacing Threshold Amplitude: 1 V
Lead Channel Pacing Threshold Pulse Width: 0.5 ms
Lead Channel Pacing Threshold Pulse Width: 0.8 ms
Lead Channel Sensing Intrinsic Amplitude: 12 mV
Lead Channel Setting Pacing Amplitude: 2 V
Lead Channel Setting Pacing Amplitude: 2.5 V
Lead Channel Setting Pacing Pulse Width: 0.5 ms
Lead Channel Setting Pacing Pulse Width: 0.8 ms
Lead Channel Setting Sensing Sensitivity: 4 mV
Pulse Gen Model: 3222
Pulse Gen Serial Number: 8013108

## 2024-10-30 ENCOUNTER — Ambulatory Visit: Payer: Self-pay | Admitting: Student in an Organized Health Care Education/Training Program

## 2024-11-03 NOTE — Progress Notes (Signed)
 Remote PPM Transmission

## 2024-11-08 ENCOUNTER — Encounter: Payer: Self-pay | Admitting: Student in an Organized Health Care Education/Training Program

## 2024-11-27 ENCOUNTER — Ambulatory Visit

## 2024-12-28 ENCOUNTER — Ambulatory Visit

## 2025-01-26 ENCOUNTER — Ambulatory Visit

## 2025-04-25 ENCOUNTER — Ambulatory Visit: Admitting: Family Medicine

## 2025-04-27 ENCOUNTER — Ambulatory Visit

## 2025-07-27 ENCOUNTER — Ambulatory Visit
